# Patient Record
Sex: Female | Born: 1935 | Race: White | Hispanic: No | Smoking: Former smoker
Health system: Southern US, Community
[De-identification: ages and names within clinical notes are randomized; demographics above are authoritative.]

## PROBLEM LIST (undated history)

## (undated) DIAGNOSIS — Z9889 Other specified postprocedural states: Secondary | ICD-10-CM

## (undated) DIAGNOSIS — H409 Unspecified glaucoma: Secondary | ICD-10-CM

## (undated) DIAGNOSIS — E7800 Pure hypercholesterolemia, unspecified: Secondary | ICD-10-CM

## (undated) DIAGNOSIS — I10 Essential (primary) hypertension: Secondary | ICD-10-CM

## (undated) DIAGNOSIS — N189 Chronic kidney disease, unspecified: Secondary | ICD-10-CM

## (undated) DIAGNOSIS — M1990 Unspecified osteoarthritis, unspecified site: Secondary | ICD-10-CM

## (undated) DIAGNOSIS — R112 Nausea with vomiting, unspecified: Secondary | ICD-10-CM

## (undated) DIAGNOSIS — C801 Malignant (primary) neoplasm, unspecified: Secondary | ICD-10-CM

## (undated) DIAGNOSIS — T07XXXA Unspecified multiple injuries, initial encounter: Secondary | ICD-10-CM

## (undated) HISTORY — PX: JOINT REPLACEMENT: SHX530

## (undated) HISTORY — PX: OTHER SURGICAL HISTORY: SHX169

## (undated) HISTORY — PX: ABDOMINAL HYSTERECTOMY: SHX81

---

## 2006-04-01 ENCOUNTER — Inpatient Hospital Stay (HOSPITAL_COMMUNITY): Admission: RE | Admit: 2006-04-01 | Discharge: 2006-04-05 | Payer: Self-pay | Admitting: Orthopedic Surgery

## 2007-02-20 ENCOUNTER — Ambulatory Visit: Payer: Self-pay | Admitting: Gastroenterology

## 2007-03-04 ENCOUNTER — Encounter (INDEPENDENT_AMBULATORY_CARE_PROVIDER_SITE_OTHER): Payer: Self-pay | Admitting: Specialist

## 2007-03-04 ENCOUNTER — Ambulatory Visit: Payer: Self-pay | Admitting: Gastroenterology

## 2008-03-10 ENCOUNTER — Ambulatory Visit: Payer: Self-pay | Admitting: Internal Medicine

## 2008-07-16 ENCOUNTER — Ambulatory Visit: Payer: Self-pay | Admitting: Internal Medicine

## 2008-07-27 ENCOUNTER — Ambulatory Visit: Payer: Self-pay | Admitting: Internal Medicine

## 2008-08-10 ENCOUNTER — Encounter: Payer: Self-pay | Admitting: Internal Medicine

## 2008-11-19 DIAGNOSIS — N189 Chronic kidney disease, unspecified: Secondary | ICD-10-CM

## 2008-11-19 HISTORY — DX: Chronic kidney disease, unspecified: N18.9

## 2010-07-22 ENCOUNTER — Inpatient Hospital Stay (HOSPITAL_COMMUNITY): Admission: EM | Admit: 2010-07-22 | Discharge: 2010-07-27 | Payer: Self-pay

## 2010-07-22 ENCOUNTER — Encounter: Payer: Self-pay | Admitting: Emergency Medicine

## 2011-02-01 LAB — PROTIME-INR
INR: 1.07 (ref 0.00–1.49)
Prothrombin Time: 14.1 seconds (ref 11.6–15.2)

## 2011-02-01 LAB — BASIC METABOLIC PANEL WITH GFR
CO2: 28 meq/L (ref 19–32)
Calcium: 8.9 mg/dL (ref 8.4–10.5)
Chloride: 99 meq/L (ref 96–112)

## 2011-02-01 LAB — URINALYSIS, ROUTINE W REFLEX MICROSCOPIC
Bilirubin Urine: NEGATIVE
Glucose, UA: NEGATIVE mg/dL
Ketones, ur: NEGATIVE mg/dL
Leukocytes, UA: NEGATIVE
Nitrite: NEGATIVE
Protein, ur: NEGATIVE mg/dL
Specific Gravity, Urine: 1.01 (ref 1.005–1.030)
Urobilinogen, UA: 0.2 mg/dL (ref 0.0–1.0)
pH: 7 (ref 5.0–8.0)

## 2011-02-01 LAB — DIFFERENTIAL
Basophils Absolute: 0 K/uL (ref 0.0–0.1)
Basophils Relative: 0 % (ref 0–1)
Eosinophils Absolute: 0.1 K/uL (ref 0.0–0.7)
Eosinophils Relative: 1 % (ref 0–5)
Lymphocytes Relative: 16 % (ref 12–46)
Lymphs Abs: 1.4 K/uL (ref 0.7–4.0)
Monocytes Absolute: 0.4 10*3/uL (ref 0.1–1.0)
Monocytes Relative: 5 % (ref 3–12)
Neutro Abs: 6.9 10*3/uL (ref 1.7–7.7)
Neutrophils Relative %: 78 % — ABNORMAL HIGH (ref 43–77)

## 2011-02-01 LAB — APTT: aPTT: 28 seconds (ref 24–37)

## 2011-02-01 LAB — POCT CARDIAC MARKERS
CKMB, poc: 2.2 ng/mL (ref 1.0–8.0)
Myoglobin, poc: 112 ng/mL (ref 12–200)
Troponin i, poc: <0.05 ng/mL (ref 0.00–0.09)

## 2011-02-01 LAB — CBC
HCT: 38 % (ref 36.0–46.0)
Hemoglobin: 13.1 g/dL (ref 12.0–15.0)
Hemoglobin: 11.7 g/dL — ABNORMAL LOW (ref 12.0–15.0)
MCH: 30.5 pg (ref 26.0–34.0)
MCHC: 34.5 g/dL (ref 30.0–36.0)
MCV: 88.6 fL (ref 78.0–100.0)
MCV: 85.3 fL (ref 78.0–100.0)
Platelets: 215 K/uL (ref 150–400)
Platelets: 193 10*3/uL (ref 150–400)
RBC: 4.3 MIL/uL (ref 3.87–5.11)
RBC: 4.08 MIL/uL (ref 3.87–5.11)
RDW: 13.6 % (ref 11.5–15.5)
RDW: 13.3 % (ref 11.5–15.5)
WBC: 8.8 10*3/uL (ref 4.0–10.5)

## 2011-02-01 LAB — TYPE AND SCREEN
ABO/RH(D): A POS
Antibody Screen: NEGATIVE

## 2011-02-01 LAB — BASIC METABOLIC PANEL
BUN: 15 mg/dL (ref 6–23)
Calcium: 8.6 mg/dL (ref 8.4–10.5)
Chloride: 100 mEq/L (ref 96–112)
Creatinine, Ser: 0.7 mg/dL (ref 0.4–1.2)
Creatinine, Ser: 0.73 mg/dL (ref 0.4–1.2)
GFR calc Af Amer: >60 mL/min (ref >60)
GFR calc non Af Amer: >60 mL/min (ref >60)
Glucose, Bld: 106 mg/dL — ABNORMAL HIGH (ref 70–99)
Glucose, Bld: 95 mg/dL (ref 70–99)
Potassium: 3.8 mEq/L (ref 3.5–5.1)
Sodium: 133 mEq/L — ABNORMAL LOW (ref 135–145)

## 2011-02-01 LAB — D-DIMER, QUANTITATIVE: D-Dimer, Quant: 3.87 ug{FEU}/mL — ABNORMAL HIGH (ref 0.00–0.48)

## 2011-02-01 LAB — URINE MICROSCOPIC-ADD ON

## 2011-04-03 NOTE — Assessment & Plan Note (Signed)
[facility]                         GASTROENTEROLOGY OFFICE NOTE   PERSEPHANIE, LAATSCH              MRN:          [id number]  DATE:03/10/2008                            DOB:          05-Oct-1926    CHIEF COMPLAINT:  Follow up after colonoscopy April, 2008.   Ms. Fenter had a colonoscopic examination by Dr. Corinda Gubler March 04, 2007.  There was a polypoid lesion in the rectum, biopsy showed  hyperplastic results.  Dr. Corinda Gubler thought she should have a follow up  of this area.  She has a history of rectal prolapse which persists, in  fact, she was evaluated by Dr. Kendrick Ranch for this in about 2005.  She  deals with it and has to reinsert the rectum when it prolapses some, it  sounds like this is mild and unchanged.   MEDICATIONS:  1. Lipitor 10 mg at bedtime.  2. Aspirin 81 mg daily.  3. Travatan eye drops.  4. Multivitamin daily.  5. Tums 400 mg daily.   There were no known drug allergies.   PAST MEDICAL HISTORY:  1. Glaucoma.  2. Rectal prolapse.  3. Dyslipidemia.   PAST SURGICAL HISTORY:  1. Partial hysterectomy, 1969.  2. Left hip replacement, 2007.   FAMILY HISTORY:  1. Heart disease in a brother.  2. Colon cancer in her brother.   SOCIAL HISTORY:  1. Married.  2. She has been an Biomedical engineer.  3. One glass of alcohol daily.  4. No tobacco or drugs.   REVIEW OF SYSTEMS:  Eyeglasses or contacts are used, joint pain.   PHYSICAL:  Height 5 feet 6 inches, weight 148 pounds, blood pressure  170/88 on the right arm sitting, pulse 60, regular.   ASSESSMENT:  Hyperplastic polypoid changes in the rectum.  I suspect  this is due to rectal prolapse.  The previous evaluator thought that  this ought to be examined again, so we will perform a flexible  sigmoidoscopy in early May just to recheck this area, but I suspect this  is related to rectal prolapse.  Risks, benefits and indications are  explained, she understands and  agrees to proceed.     Iva Boop, MD,FACG  Electronically Signed    CEG/MedQ  DD: 03/14/2008  DT: 03/14/2008  Job #: [id number]

## 2011-04-06 NOTE — H&P (Signed)
NAME:  Christy Houston, MEARNS NO.:  [id number]   MEDICAL RECORD NO.:  [id number]          PATIENT TYPE:  INP   LOCATION:  NA                           FACILITY:  [facility]   PHYSICIAN:  Ollen Gross, M.D.    DATE OF BIRTH:  December 27, 1925   DATE OF ADMISSION:  04/01/2006  DATE OF DISCHARGE:                                HISTORY & PHYSICAL   CHIEF COMPLAINT:  Left hip pain.   HISTORY OF PRESENT ILLNESS:  The patient is a 77 year old female seen by Dr.  Ollen Gross for aggressive history of worsening left hip pain.  She has  had problems with hip for quite some time but it has been progressively  getting worse over the past six months with decreased function.  She has  been seen in the office.  Her x-rays showed end-stage arthritis of the left  hip with bone-on-bone and large osteophyte formation.  Significant findings  felt to be a good candidate for surgical intervention.  Risks and benefits  discussed.  The patient was subsequently admitted to the hospital.  She has  been seen preoperatively by Dr. Tomasa Blase and felt to represent a low medical  risk for planned hip surgery.   ALLERGIES:  No known drug allergies.   CURRENT MEDICATIONS:  1.  Lodine 400 mg twice a day.  2.  Lipitor 10 mg at bed time.  3.  Travatan 1 drop each eye nightly.  4.  Aspirin 81 mg daily.  5.  Fosamax 70 mg weekly.  6.  Multivitamin daily.  7.  Tums supplement daily.   PAST MEDICAL HISTORY:  1.  Hypertension.  2.  Hypercholesterolemia.  3.  History of colon polyps.  4.  History of rectal prolapse.  5.  History of basal cell skin cancer.  6.  Postmenopausal.  7.  History of stress fracture right tibia that is treated conservatively.   PAST SURGICAL HISTORY:  1.  Colonoscopy with polypectomy.  2.  Hysterectomy.  3.  History of PACs and PVCs.   SOCIAL HISTORY:  Married, retired, nonsmoker, one glass of wine at dinner,  three stepchildren.  Husband will be assisting with care after  surgery.   FAMILY HISTORY:  Brother and mother with history of hypertension.  Brothers  with colon cancer and prostate cancer.   REVIEW OF SYSTEMS:  GENERAL:  No fever, chills, night sweats.  NEUROLOGICAL:  No seizures, syncope or paralysis.  RESPIRATORY:  No shortness of breath,  productive cough or hemoptysis.  CARDIOVASCULAR:  No chest pain, angina or  orthopnea.  GI:  No nausea, vomiting, diarrhea, constipation.  GU:  No  dysuria, hematuria or discharge.  MUSCULOSKELETAL:  Left hip.   PHYSICAL EXAMINATION:  VITAL SIGNS:  Pulse 76, respiration 12, blood  pressure 162/82.  GENERAL:  A 77 year old white female well-developed, well-nourished in no  acute distress, glasses.  Alert, oriented and cooperative.  Accompanied by  her husband.  HEENT:  Normocephalic and atraumatic.  Pupils round and reactive.  Oropharynx clear.  Partial lower denture plate.  NECK:  Supple.  CHEST:  Clear.  Anterior and posterior chest walls.  HEART:  Regular  rhythm with an irregular ectopic (PVCs).  A 2/6 systolic  ejection murmur best heard at pulmonic and Erb's point.  ABDOMEN:  Soft and nontender.  Bowel sounds present.  RECTAL:  Not done, not pertinent to present illness.  BREASTS:  Not done, not pertinent to present illness.  GENITALIA:  Not done, not pertinent to present illness.  EXTREMITIES:  Left hip shows flexion of 90 degrees, 0 internal rotation, 10  degrees of external rotation, 20 degrees of abduction.   IMPRESSION:  1.  Osteoarthritis left hip.  2.  History of premature atrial contractures and premature ventricular      contractions.  3.  Hypercholesterolemia.  4.  Hypertension.  5.  History of colon polyps, status post polypectomy.  6.  History of minimal rectal prolapse.  7.  History of basal cell skin cancer.  8.  Postmenopausal.   PLAN:  The patient is admitted to [facility] to undergo a left  total hip arthroplasty.  Surgery will be performed by Dr. Ollen Gross.      Alexzandrew L. Julien Girt, P.A.      Ollen Gross, M.D.  Electronically Signed    ALP/MEDQ  D:  03/31/2006  T:  03/31/2006  Job:  [id number]   cc:   Foye Deer, M.D.  [facility]   Ollen Gross, M.D.  Fax: [phone]

## 2011-04-06 NOTE — Discharge Summary (Signed)
NAME:  ALENNA, RUSSELL       ACCOUNT NO.:  [id number]   MEDICAL RECORD NO.:  [id number]          PATIENT TYPE:  INP   LOCATION:  1604                         FACILITY:  [facility]   PHYSICIAN:  Ollen Gross, M.D.    DATE OF BIRTH:  [birth year]   DATE OF ADMISSION:  04/01/2006  DATE OF DISCHARGE:  04/05/2006                                 DISCHARGE SUMMARY   ADMISSION DIAGNOSES:  1.  Osteoarthritis of the left hip.  2.  History of premature atrial contractures and premature ventricular      contractures.  3.  Hypercholesterolemia.  4.  Hypertension.  5.  History of colon polyps status post polypectomy.  6.  History of minimal rectal prolapse.  7.  History of basal cell skin cancer.  8.  Postmenopausal.   DISCHARGE DIAGNOSES:  1.  Osteoarthritis of the left hip status post left total knee arthroplasty.  2.  Postoperative blood loss anemia, did not require transfusion.  3.  Postoperative hyponatremia.  4.  History of premature atrial contractures and premature ventricular      contractures.  5.  Hypercholesterolemia.  6.  Hypertension.  7.  History of colon polyps status post polypectomy.  8.  History of minimal rectal prolapse.  9.  History of basal cell skin cancer.  10. Postmenopausal.   PROCEDURES:  On Apr 01, 2006, left total hip surgery.   SURGEON:  Ollen Gross, M.D.   ASSISTANT:  Alexzandrew L. Julien Girt, P.A.   ANESTHESIA:  General.   CONSULTATIONS:  None.   BRIEF HISTORY:  Mrs. Perot is a 77 year old female with severe end  stage osteoarthritis of the left hip and intractable pain, __________and now  presents for total arthroplasty.   LABORATORY DATA:  CBC on admission:  Hemoglobin 13.6, hematocrit 39.3, white  count 6.3, postop hemoglobin down to 11.1, drifted down to 9.2 where it  stabilized.  Last H&H was 9.2 and 26.7.  PT/PTT preop were 13.6 and 34,  respectively with INR 1.0.  Serial pro times followed.  PT/INR 18.0 and 1.5.  Chem panel on  admission all within normal limits.  Serum B-mets are as  follows:  Sodium dropped from 136 to 132, drifting down to 130, back up to  131.  The remaining electrolytes were negative within normal limits.  Calcium dropped from 9.8 to 7.7 back up to 8.1.  Preop UA cloudy with small  leukocyte esterase.  Only 0-2 white cells, many bacteria.  Otherwise,  negative.  Blood group check A positive.   EKG dated Mar 26, 2006, normal sinus rhythm, nonspecific ST abnormalities,  confirmed by Dr. Nanetta Batty.  A two view of the chest on Mar 26, 2006,  revealed no evidence of active chest disease.  Left hip films Mar 26, 2006,  severe changes of osteoarthritis involving the left hip.  Portable pelvic  and hip film on Apr 01, 2006, expected appearance after left total  arthroplasty.   HOSPITAL COURSE:  Admitted to [facility], tolerated procedure well  and later transferred to the recovery room and then orthopedic floor.  Started on PCA and p.o. analgesics for pain control following surgery.  Seen  on morning rounds.  Had a slightly low potassium.  It was low normal, but  still low from her admission.  She was given a little bit of potassium  supplements.  Started getting up out of bed.  The Hemovac drain placed at  the time of surgery was pulled without difficulty.  By day #2, she had a  little bit of intermittent nausea the night before, but that was improving.  Monitored for symptoms.  Mild hypokalemia improved but her sodium dropped.  Discontinued the fluids.  Hemoglobin had drifted down to 9.2.  She was  placed on iron and rechecked.  Started getting up with physical therapy a  little bit more, and on day #2, she was ambulating approximately 28-30 feet.  Dressing was changed.  Incision looked very good.  By day #3, she had a  little bit of soreness but had not taken any pain medications.  She was  tolerating well.  Hyponatremia had drifted down further but stabilized at  130.  She was  placed on mild fluid restrictions because of the hyponatremia.  Incision remained in good condition.  Hemoglobin had stabilized at 9.2.  She  was placed on iron.  She started ambulating very well, greater than 90 feet  by the following day.  Her sodium actually improved a little bit, went up,  and incision continued to look good.  She was ambulating greater than 130  feet.  No complaints, doing well, and was discharged home.   PLAN:  1.  Being discharged home on Apr 05, 2006.  2.  For discharge diagnoses, please see above.   DISCHARGE MEDICATIONS:  1.  Coumadin.  2.  Iron supplement.  3.  Percocet.  4.  Robaxin.   DISCHARGE INSTRUCTIONS:  1.  Diet:  Low cholesterol diet.  2.  Activity:  Partial weightbearing 25-50% left lower extremity.  Hip      precautions:  Total hip protocol.   FOLLOWUP:  Follow up in two weeks from surgery.   DISPOSITION:  To home.   CONDITION ON DISCHARGE:  Improved.      Alexzandrew L. Julien Girt, P.A.      Ollen Gross, M.D.  Electronically Signed    ALP/MEDQ  D:  04/17/2006  T:  04/17/2006  Job:  [id number]   cc:   Duane Boston, M.D.  [facility]

## 2011-04-06 NOTE — Op Note (Signed)
NAME:  Christy Houston, Christy Houston NO.:  [id number]   MEDICAL RECORD NO.:  [id number]          PATIENT TYPE:  INP   LOCATION:  0004                         FACILITY:  [facility]   PHYSICIAN:  Ollen Gross, M.D.    DATE OF BIRTH:  10-May-1926   DATE OF PROCEDURE:  04/01/2006  DATE OF DISCHARGE:                                 OPERATIVE REPORT   PREOPERATIVE DIAGNOSIS:  Osteoarthritis of the left hip.   POSTOPERATIVE DIAGNOSIS:  Osteoarthritis of the left hip.   PROCEDURE:  Left total hip arthroplasty.   SURGEON:  Ollen Gross, M.D.   ASSISTANT:  Alexzandrew L. Julien Girt, P.A.   ANESTHESIA:  General.   ESTIMATED BLOOD LOSS:  300 cc.   DRAIN:  Hemovac x1.   COMPLICATIONS:  None.   CONDITION:  Stable to recovery.   BRIEF CLINICAL NOTE:  Christy Houston is a 77 year old female with severe  end-stage osteoarthritis of the left hip with intractable pain.  She has  failed nonoperative management and presents now for total hip arthroplasty.   PROCEDURE IN DETAIL:  After the successful initiation of general anesthetic,  the patient was placed in the right lateral decubitus position with the left  side up and held with the hip positioner.  The left lower extremity was  isolated from her perineum with plastic drapes, and prepped and draped in  the usual sterile fashion.  Short posterolateral incision was made with the  #10 blade through the subcutaneous tissue, to the level of fascia lata --  which was incised in-line with the skin incision.  The sciatic nerve was  palpated and protected,  and then the short rotator was isolated off the  femur.  Capsulectomy was performed and the hip was dislocated.  The center  of the femoral head was marked and a trial prosthesis placed; set that the  center of the trial head corresponds to the center of the native femoral  head.  Osteotomy lines marked on the femoral neck and osteotomy made with an  oscillating saw.  The femoral head was then  removed and the femur retracted  anteriorly to gain acetabular exposure.   Acetabular retractors were placed and the labrum and osteophytes were  removed.  She had a large anterior osteophyte.  Reaming starts at 45 mm,  coursing increments of 2 up to 53 mm; then a 54 mm Pinnacle acetabular shell  was placed in anatomic position, and transfixed with two dome screws.  A  trial 32 mm neutral liner was placed.   The femur is prepaed  with the canal finder and irrigation.  Axial reaming  was performed up to 15.5 mm, proximal reamed to a 20-F sleeve machined to a  small.  A 20-F small trial sleeve was placed with a 20 x 15 stem and 36-Plus  8 neck about 10 degrees beyond her native anteversion.  A trial 32.0 head  was placed.  I did not feel as though she had enough offset, so went to the  32-Plus 4 neutral liner, which corrected any offset tissues.  She had great  stability, full extension, full external rotation; 70 degrees flexion, 40  degrees adduction, 90 degrees internal rotation and 90 degrees flexion to  about 70 degrees internal rotation.  By placing the left leg on top of the  right, I felt as though we were a little short.  She also had some soft  tissue laxity with the shuck testing.  I went to a 32-Plus 3  head trial,  and it eliminated all of those issues.  The hip was then dislocated and all  trials were removed.  The permanent Apex hole eliminator was placed into the  acetabular shell, and the permanent 32 mm neutral Plus-4 marathon liner was  placed.  On the femoral side I placed the 20-F small sleeve with the 20 x 15  stem and 36-Plus 8 neck; again, 10 degrees beyond her native anteversion.  A  32-Plus 3 head was placed in the hip was reduced at the same stability  parameters.  The wound was copiously irrigated with saline solution and the  short rotators were reattached to the femur through drill holes.  The fascia  lata was closed over a Hemovac drain with interrupted #1  Vicryl.  The  subcutaneous was closed with #1-0 and #2-0 Vicryl, and subcuticular running  4-0 Monocryl.  The incision was cleaned and dried, then Steri-Strips and a  bulky sterile dressing applied.  She was then placed into a knee  immobilizer, awakened and transported to recovery in stable condition.      Ollen Gross, M.D.  Electronically Signed     FA/MEDQ  D:  04/01/2006  T:  04/01/2006  Job:  [id number]

## 2011-11-30 DIAGNOSIS — M79609 Pain in unspecified limb: Secondary | ICD-10-CM | POA: Diagnosis not present

## 2011-11-30 DIAGNOSIS — B351 Tinea unguium: Secondary | ICD-10-CM | POA: Diagnosis not present

## 2012-01-02 DIAGNOSIS — C649 Malignant neoplasm of unspecified kidney, except renal pelvis: Secondary | ICD-10-CM | POA: Diagnosis not present

## 2012-01-02 DIAGNOSIS — E785 Hyperlipidemia, unspecified: Secondary | ICD-10-CM | POA: Diagnosis not present

## 2012-01-02 DIAGNOSIS — E559 Vitamin D deficiency, unspecified: Secondary | ICD-10-CM | POA: Diagnosis not present

## 2012-01-02 DIAGNOSIS — Z0000 Encounter for general adult medical examination without abnormal findings: Secondary | ICD-10-CM | POA: Diagnosis not present

## 2012-01-02 DIAGNOSIS — Z79899 Other long term (current) drug therapy: Secondary | ICD-10-CM | POA: Diagnosis not present

## 2012-01-02 DIAGNOSIS — Z1231 Encounter for screening mammogram for malignant neoplasm of breast: Secondary | ICD-10-CM | POA: Diagnosis not present

## 2012-01-03 DIAGNOSIS — M1710 Unilateral primary osteoarthritis, unspecified knee: Secondary | ICD-10-CM | POA: Diagnosis not present

## 2012-02-01 DIAGNOSIS — B351 Tinea unguium: Secondary | ICD-10-CM | POA: Diagnosis not present

## 2012-02-01 DIAGNOSIS — M79609 Pain in unspecified limb: Secondary | ICD-10-CM | POA: Diagnosis not present

## 2012-02-14 DIAGNOSIS — H4011X0 Primary open-angle glaucoma, stage unspecified: Secondary | ICD-10-CM | POA: Diagnosis not present

## 2012-02-14 DIAGNOSIS — H409 Unspecified glaucoma: Secondary | ICD-10-CM | POA: Diagnosis not present

## 2012-04-11 DIAGNOSIS — M79609 Pain in unspecified limb: Secondary | ICD-10-CM | POA: Diagnosis not present

## 2012-04-11 DIAGNOSIS — B351 Tinea unguium: Secondary | ICD-10-CM | POA: Diagnosis not present

## 2012-06-06 DIAGNOSIS — H4011X0 Primary open-angle glaucoma, stage unspecified: Secondary | ICD-10-CM | POA: Diagnosis not present

## 2012-06-06 DIAGNOSIS — H409 Unspecified glaucoma: Secondary | ICD-10-CM | POA: Diagnosis not present

## 2012-06-06 DIAGNOSIS — Z83511 Family history of glaucoma: Secondary | ICD-10-CM | POA: Diagnosis not present

## 2012-06-13 DIAGNOSIS — M79609 Pain in unspecified limb: Secondary | ICD-10-CM | POA: Diagnosis not present

## 2012-06-13 DIAGNOSIS — B351 Tinea unguium: Secondary | ICD-10-CM | POA: Diagnosis not present

## 2012-06-18 DIAGNOSIS — M1710 Unilateral primary osteoarthritis, unspecified knee: Secondary | ICD-10-CM | POA: Diagnosis not present

## 2012-06-25 DIAGNOSIS — D4100 Neoplasm of uncertain behavior of unspecified kidney: Secondary | ICD-10-CM | POA: Diagnosis not present

## 2012-06-25 DIAGNOSIS — N390 Urinary tract infection, site not specified: Secondary | ICD-10-CM | POA: Diagnosis not present

## 2012-06-25 DIAGNOSIS — R300 Dysuria: Secondary | ICD-10-CM | POA: Diagnosis not present

## 2012-06-25 DIAGNOSIS — R350 Frequency of micturition: Secondary | ICD-10-CM | POA: Diagnosis not present

## 2012-07-02 DIAGNOSIS — I10 Essential (primary) hypertension: Secondary | ICD-10-CM | POA: Diagnosis not present

## 2012-07-02 DIAGNOSIS — K589 Irritable bowel syndrome without diarrhea: Secondary | ICD-10-CM | POA: Diagnosis not present

## 2012-07-02 DIAGNOSIS — E785 Hyperlipidemia, unspecified: Secondary | ICD-10-CM | POA: Diagnosis not present

## 2012-07-02 DIAGNOSIS — IMO0002 Reserved for concepts with insufficient information to code with codable children: Secondary | ICD-10-CM | POA: Diagnosis not present

## 2012-07-02 DIAGNOSIS — C649 Malignant neoplasm of unspecified kidney, except renal pelvis: Secondary | ICD-10-CM | POA: Diagnosis not present

## 2012-07-29 DIAGNOSIS — M169 Osteoarthritis of hip, unspecified: Secondary | ICD-10-CM | POA: Diagnosis not present

## 2012-08-15 DIAGNOSIS — M79609 Pain in unspecified limb: Secondary | ICD-10-CM | POA: Diagnosis not present

## 2012-08-15 DIAGNOSIS — B351 Tinea unguium: Secondary | ICD-10-CM | POA: Diagnosis not present

## 2012-09-23 DIAGNOSIS — H409 Unspecified glaucoma: Secondary | ICD-10-CM | POA: Diagnosis not present

## 2012-09-23 DIAGNOSIS — H4011X0 Primary open-angle glaucoma, stage unspecified: Secondary | ICD-10-CM | POA: Diagnosis not present

## 2012-10-08 DIAGNOSIS — M1710 Unilateral primary osteoarthritis, unspecified knee: Secondary | ICD-10-CM | POA: Diagnosis not present

## 2012-10-17 DIAGNOSIS — B351 Tinea unguium: Secondary | ICD-10-CM | POA: Diagnosis not present

## 2012-10-17 DIAGNOSIS — M79609 Pain in unspecified limb: Secondary | ICD-10-CM | POA: Diagnosis not present

## 2012-10-24 DIAGNOSIS — M2010 Hallux valgus (acquired), unspecified foot: Secondary | ICD-10-CM | POA: Diagnosis not present

## 2012-10-24 DIAGNOSIS — Z6825 Body mass index (BMI) 25.0-25.9, adult: Secondary | ICD-10-CM | POA: Diagnosis not present

## 2012-10-24 DIAGNOSIS — M21619 Bunion of unspecified foot: Secondary | ICD-10-CM | POA: Diagnosis not present

## 2012-11-17 DIAGNOSIS — M2010 Hallux valgus (acquired), unspecified foot: Secondary | ICD-10-CM | POA: Diagnosis not present

## 2012-11-17 DIAGNOSIS — M76829 Posterior tibial tendinitis, unspecified leg: Secondary | ICD-10-CM | POA: Diagnosis not present

## 2012-11-17 DIAGNOSIS — M2040 Other hammer toe(s) (acquired), unspecified foot: Secondary | ICD-10-CM | POA: Diagnosis not present

## 2012-11-27 DIAGNOSIS — M76829 Posterior tibial tendinitis, unspecified leg: Secondary | ICD-10-CM | POA: Diagnosis not present

## 2012-12-19 DIAGNOSIS — B351 Tinea unguium: Secondary | ICD-10-CM | POA: Diagnosis not present

## 2012-12-19 DIAGNOSIS — M79609 Pain in unspecified limb: Secondary | ICD-10-CM | POA: Diagnosis not present

## 2013-01-07 DIAGNOSIS — H4011X0 Primary open-angle glaucoma, stage unspecified: Secondary | ICD-10-CM | POA: Diagnosis not present

## 2013-01-07 DIAGNOSIS — H409 Unspecified glaucoma: Secondary | ICD-10-CM | POA: Diagnosis not present

## 2013-01-15 DIAGNOSIS — Z0000 Encounter for general adult medical examination without abnormal findings: Secondary | ICD-10-CM | POA: Diagnosis not present

## 2013-01-15 DIAGNOSIS — C649 Malignant neoplasm of unspecified kidney, except renal pelvis: Secondary | ICD-10-CM | POA: Diagnosis not present

## 2013-01-15 DIAGNOSIS — R0989 Other specified symptoms and signs involving the circulatory and respiratory systems: Secondary | ICD-10-CM | POA: Diagnosis not present

## 2013-01-15 DIAGNOSIS — Z1231 Encounter for screening mammogram for malignant neoplasm of breast: Secondary | ICD-10-CM | POA: Diagnosis not present

## 2013-01-15 DIAGNOSIS — E785 Hyperlipidemia, unspecified: Secondary | ICD-10-CM | POA: Diagnosis not present

## 2013-01-15 DIAGNOSIS — R0609 Other forms of dyspnea: Secondary | ICD-10-CM | POA: Diagnosis not present

## 2013-01-15 DIAGNOSIS — E559 Vitamin D deficiency, unspecified: Secondary | ICD-10-CM | POA: Diagnosis not present

## 2013-01-15 DIAGNOSIS — I10 Essential (primary) hypertension: Secondary | ICD-10-CM | POA: Diagnosis not present

## 2013-01-15 DIAGNOSIS — IMO0002 Reserved for concepts with insufficient information to code with codable children: Secondary | ICD-10-CM | POA: Diagnosis not present

## 2013-02-02 DIAGNOSIS — S6990XA Unspecified injury of unspecified wrist, hand and finger(s), initial encounter: Secondary | ICD-10-CM | POA: Diagnosis not present

## 2013-02-02 DIAGNOSIS — S59909A Unspecified injury of unspecified elbow, initial encounter: Secondary | ICD-10-CM | POA: Diagnosis not present

## 2013-02-02 DIAGNOSIS — S5000XA Contusion of unspecified elbow, initial encounter: Secondary | ICD-10-CM | POA: Diagnosis not present

## 2013-02-05 DIAGNOSIS — S42413A Displaced simple supracondylar fracture without intercondylar fracture of unspecified humerus, initial encounter for closed fracture: Secondary | ICD-10-CM | POA: Diagnosis not present

## 2013-02-12 DIAGNOSIS — Z4789 Encounter for other orthopedic aftercare: Secondary | ICD-10-CM | POA: Diagnosis not present

## 2013-02-23 DIAGNOSIS — Z4789 Encounter for other orthopedic aftercare: Secondary | ICD-10-CM | POA: Diagnosis not present

## 2013-03-03 DIAGNOSIS — S42413A Displaced simple supracondylar fracture without intercondylar fracture of unspecified humerus, initial encounter for closed fracture: Secondary | ICD-10-CM | POA: Diagnosis not present

## 2013-03-16 DIAGNOSIS — IMO0001 Reserved for inherently not codable concepts without codable children: Secondary | ICD-10-CM | POA: Diagnosis not present

## 2013-03-20 DIAGNOSIS — B351 Tinea unguium: Secondary | ICD-10-CM | POA: Diagnosis not present

## 2013-03-20 DIAGNOSIS — M79609 Pain in unspecified limb: Secondary | ICD-10-CM | POA: Diagnosis not present

## 2013-03-24 DIAGNOSIS — S42413A Displaced simple supracondylar fracture without intercondylar fracture of unspecified humerus, initial encounter for closed fracture: Secondary | ICD-10-CM | POA: Diagnosis not present

## 2013-03-24 DIAGNOSIS — M6281 Muscle weakness (generalized): Secondary | ICD-10-CM | POA: Diagnosis not present

## 2013-03-25 DIAGNOSIS — M6281 Muscle weakness (generalized): Secondary | ICD-10-CM | POA: Diagnosis not present

## 2013-03-25 DIAGNOSIS — S42413A Displaced simple supracondylar fracture without intercondylar fracture of unspecified humerus, initial encounter for closed fracture: Secondary | ICD-10-CM | POA: Diagnosis not present

## 2013-03-26 DIAGNOSIS — S42413A Displaced simple supracondylar fracture without intercondylar fracture of unspecified humerus, initial encounter for closed fracture: Secondary | ICD-10-CM | POA: Diagnosis not present

## 2013-03-26 DIAGNOSIS — M6281 Muscle weakness (generalized): Secondary | ICD-10-CM | POA: Diagnosis not present

## 2013-03-30 DIAGNOSIS — M6281 Muscle weakness (generalized): Secondary | ICD-10-CM | POA: Diagnosis not present

## 2013-03-30 DIAGNOSIS — S42413A Displaced simple supracondylar fracture without intercondylar fracture of unspecified humerus, initial encounter for closed fracture: Secondary | ICD-10-CM | POA: Diagnosis not present

## 2013-03-31 DIAGNOSIS — S42413A Displaced simple supracondylar fracture without intercondylar fracture of unspecified humerus, initial encounter for closed fracture: Secondary | ICD-10-CM | POA: Diagnosis not present

## 2013-03-31 DIAGNOSIS — M6281 Muscle weakness (generalized): Secondary | ICD-10-CM | POA: Diagnosis not present

## 2013-04-03 DIAGNOSIS — IMO0001 Reserved for inherently not codable concepts without codable children: Secondary | ICD-10-CM | POA: Diagnosis not present

## 2013-04-06 DIAGNOSIS — M6281 Muscle weakness (generalized): Secondary | ICD-10-CM | POA: Diagnosis not present

## 2013-04-06 DIAGNOSIS — S42413A Displaced simple supracondylar fracture without intercondylar fracture of unspecified humerus, initial encounter for closed fracture: Secondary | ICD-10-CM | POA: Diagnosis not present

## 2013-04-07 DIAGNOSIS — M6281 Muscle weakness (generalized): Secondary | ICD-10-CM | POA: Diagnosis not present

## 2013-04-07 DIAGNOSIS — S42413A Displaced simple supracondylar fracture without intercondylar fracture of unspecified humerus, initial encounter for closed fracture: Secondary | ICD-10-CM | POA: Diagnosis not present

## 2013-04-08 DIAGNOSIS — H4011X0 Primary open-angle glaucoma, stage unspecified: Secondary | ICD-10-CM | POA: Diagnosis not present

## 2013-04-08 DIAGNOSIS — H409 Unspecified glaucoma: Secondary | ICD-10-CM | POA: Diagnosis not present

## 2013-04-09 DIAGNOSIS — M6281 Muscle weakness (generalized): Secondary | ICD-10-CM | POA: Diagnosis not present

## 2013-04-09 DIAGNOSIS — S42413A Displaced simple supracondylar fracture without intercondylar fracture of unspecified humerus, initial encounter for closed fracture: Secondary | ICD-10-CM | POA: Diagnosis not present

## 2013-04-13 DIAGNOSIS — M6281 Muscle weakness (generalized): Secondary | ICD-10-CM | POA: Diagnosis not present

## 2013-04-13 DIAGNOSIS — S42413A Displaced simple supracondylar fracture without intercondylar fracture of unspecified humerus, initial encounter for closed fracture: Secondary | ICD-10-CM | POA: Diagnosis not present

## 2013-04-14 DIAGNOSIS — M6281 Muscle weakness (generalized): Secondary | ICD-10-CM | POA: Diagnosis not present

## 2013-04-14 DIAGNOSIS — S42413A Displaced simple supracondylar fracture without intercondylar fracture of unspecified humerus, initial encounter for closed fracture: Secondary | ICD-10-CM | POA: Diagnosis not present

## 2013-04-16 DIAGNOSIS — S42413A Displaced simple supracondylar fracture without intercondylar fracture of unspecified humerus, initial encounter for closed fracture: Secondary | ICD-10-CM | POA: Diagnosis not present

## 2013-04-16 DIAGNOSIS — M6281 Muscle weakness (generalized): Secondary | ICD-10-CM | POA: Diagnosis not present

## 2013-04-20 DIAGNOSIS — S42413A Displaced simple supracondylar fracture without intercondylar fracture of unspecified humerus, initial encounter for closed fracture: Secondary | ICD-10-CM | POA: Diagnosis not present

## 2013-04-20 DIAGNOSIS — M6281 Muscle weakness (generalized): Secondary | ICD-10-CM | POA: Diagnosis not present

## 2013-04-21 DIAGNOSIS — S42413A Displaced simple supracondylar fracture without intercondylar fracture of unspecified humerus, initial encounter for closed fracture: Secondary | ICD-10-CM | POA: Diagnosis not present

## 2013-04-21 DIAGNOSIS — M6281 Muscle weakness (generalized): Secondary | ICD-10-CM | POA: Diagnosis not present

## 2013-04-23 DIAGNOSIS — M6281 Muscle weakness (generalized): Secondary | ICD-10-CM | POA: Diagnosis not present

## 2013-04-23 DIAGNOSIS — S42413A Displaced simple supracondylar fracture without intercondylar fracture of unspecified humerus, initial encounter for closed fracture: Secondary | ICD-10-CM | POA: Diagnosis not present

## 2013-04-27 DIAGNOSIS — S42413A Displaced simple supracondylar fracture without intercondylar fracture of unspecified humerus, initial encounter for closed fracture: Secondary | ICD-10-CM | POA: Diagnosis not present

## 2013-04-27 DIAGNOSIS — M6281 Muscle weakness (generalized): Secondary | ICD-10-CM | POA: Diagnosis not present

## 2013-04-28 DIAGNOSIS — M6281 Muscle weakness (generalized): Secondary | ICD-10-CM | POA: Diagnosis not present

## 2013-04-28 DIAGNOSIS — S42413A Displaced simple supracondylar fracture without intercondylar fracture of unspecified humerus, initial encounter for closed fracture: Secondary | ICD-10-CM | POA: Diagnosis not present

## 2013-04-30 DIAGNOSIS — M6281 Muscle weakness (generalized): Secondary | ICD-10-CM | POA: Diagnosis not present

## 2013-04-30 DIAGNOSIS — S42413A Displaced simple supracondylar fracture without intercondylar fracture of unspecified humerus, initial encounter for closed fracture: Secondary | ICD-10-CM | POA: Diagnosis not present

## 2013-05-01 DIAGNOSIS — IMO0001 Reserved for inherently not codable concepts without codable children: Secondary | ICD-10-CM | POA: Diagnosis not present

## 2013-05-05 DIAGNOSIS — S42413A Displaced simple supracondylar fracture without intercondylar fracture of unspecified humerus, initial encounter for closed fracture: Secondary | ICD-10-CM | POA: Diagnosis not present

## 2013-05-05 DIAGNOSIS — M6281 Muscle weakness (generalized): Secondary | ICD-10-CM | POA: Diagnosis not present

## 2013-05-07 DIAGNOSIS — M6281 Muscle weakness (generalized): Secondary | ICD-10-CM | POA: Diagnosis not present

## 2013-05-07 DIAGNOSIS — S42413A Displaced simple supracondylar fracture without intercondylar fracture of unspecified humerus, initial encounter for closed fracture: Secondary | ICD-10-CM | POA: Diagnosis not present

## 2013-05-08 DIAGNOSIS — M1710 Unilateral primary osteoarthritis, unspecified knee: Secondary | ICD-10-CM | POA: Diagnosis not present

## 2013-05-11 DIAGNOSIS — M6281 Muscle weakness (generalized): Secondary | ICD-10-CM | POA: Diagnosis not present

## 2013-05-11 DIAGNOSIS — S42413A Displaced simple supracondylar fracture without intercondylar fracture of unspecified humerus, initial encounter for closed fracture: Secondary | ICD-10-CM | POA: Diagnosis not present

## 2013-05-12 DIAGNOSIS — S42413A Displaced simple supracondylar fracture without intercondylar fracture of unspecified humerus, initial encounter for closed fracture: Secondary | ICD-10-CM | POA: Diagnosis not present

## 2013-05-12 DIAGNOSIS — M6281 Muscle weakness (generalized): Secondary | ICD-10-CM | POA: Diagnosis not present

## 2013-05-14 DIAGNOSIS — S42413A Displaced simple supracondylar fracture without intercondylar fracture of unspecified humerus, initial encounter for closed fracture: Secondary | ICD-10-CM | POA: Diagnosis not present

## 2013-05-14 DIAGNOSIS — M6281 Muscle weakness (generalized): Secondary | ICD-10-CM | POA: Diagnosis not present

## 2013-05-18 DIAGNOSIS — M6281 Muscle weakness (generalized): Secondary | ICD-10-CM | POA: Diagnosis not present

## 2013-05-18 DIAGNOSIS — S42413A Displaced simple supracondylar fracture without intercondylar fracture of unspecified humerus, initial encounter for closed fracture: Secondary | ICD-10-CM | POA: Diagnosis not present

## 2013-05-21 DIAGNOSIS — M6281 Muscle weakness (generalized): Secondary | ICD-10-CM | POA: Diagnosis not present

## 2013-05-21 DIAGNOSIS — S42413A Displaced simple supracondylar fracture without intercondylar fracture of unspecified humerus, initial encounter for closed fracture: Secondary | ICD-10-CM | POA: Diagnosis not present

## 2013-05-22 DIAGNOSIS — B351 Tinea unguium: Secondary | ICD-10-CM | POA: Diagnosis not present

## 2013-05-22 DIAGNOSIS — M79609 Pain in unspecified limb: Secondary | ICD-10-CM | POA: Diagnosis not present

## 2013-05-26 DIAGNOSIS — M6281 Muscle weakness (generalized): Secondary | ICD-10-CM | POA: Diagnosis not present

## 2013-05-26 DIAGNOSIS — S42413A Displaced simple supracondylar fracture without intercondylar fracture of unspecified humerus, initial encounter for closed fracture: Secondary | ICD-10-CM | POA: Diagnosis not present

## 2013-05-27 DIAGNOSIS — M6281 Muscle weakness (generalized): Secondary | ICD-10-CM | POA: Diagnosis not present

## 2013-05-27 DIAGNOSIS — S42413A Displaced simple supracondylar fracture without intercondylar fracture of unspecified humerus, initial encounter for closed fracture: Secondary | ICD-10-CM | POA: Diagnosis not present

## 2013-06-01 DIAGNOSIS — M1710 Unilateral primary osteoarthritis, unspecified knee: Secondary | ICD-10-CM | POA: Diagnosis not present

## 2013-06-02 DIAGNOSIS — M6281 Muscle weakness (generalized): Secondary | ICD-10-CM | POA: Diagnosis not present

## 2013-06-02 DIAGNOSIS — S42413A Displaced simple supracondylar fracture without intercondylar fracture of unspecified humerus, initial encounter for closed fracture: Secondary | ICD-10-CM | POA: Diagnosis not present

## 2013-06-22 DIAGNOSIS — S42413A Displaced simple supracondylar fracture without intercondylar fracture of unspecified humerus, initial encounter for closed fracture: Secondary | ICD-10-CM | POA: Diagnosis not present

## 2013-06-22 DIAGNOSIS — M6281 Muscle weakness (generalized): Secondary | ICD-10-CM | POA: Diagnosis not present

## 2013-06-25 DIAGNOSIS — D4100 Neoplasm of uncertain behavior of unspecified kidney: Secondary | ICD-10-CM | POA: Diagnosis not present

## 2013-06-25 DIAGNOSIS — D4120 Neoplasm of uncertain behavior of unspecified ureter: Secondary | ICD-10-CM | POA: Diagnosis not present

## 2013-06-25 DIAGNOSIS — N390 Urinary tract infection, site not specified: Secondary | ICD-10-CM | POA: Diagnosis not present

## 2013-06-27 DIAGNOSIS — N390 Urinary tract infection, site not specified: Secondary | ICD-10-CM | POA: Diagnosis not present

## 2013-07-02 DIAGNOSIS — N390 Urinary tract infection, site not specified: Secondary | ICD-10-CM | POA: Diagnosis not present

## 2013-07-02 DIAGNOSIS — N318 Other neuromuscular dysfunction of bladder: Secondary | ICD-10-CM | POA: Diagnosis not present

## 2013-07-02 DIAGNOSIS — D4100 Neoplasm of uncertain behavior of unspecified kidney: Secondary | ICD-10-CM | POA: Diagnosis not present

## 2013-07-16 DIAGNOSIS — IMO0002 Reserved for concepts with insufficient information to code with codable children: Secondary | ICD-10-CM | POA: Diagnosis not present

## 2013-07-16 DIAGNOSIS — Z9181 History of falling: Secondary | ICD-10-CM | POA: Diagnosis not present

## 2013-07-16 DIAGNOSIS — H6120 Impacted cerumen, unspecified ear: Secondary | ICD-10-CM | POA: Diagnosis not present

## 2013-07-16 DIAGNOSIS — C649 Malignant neoplasm of unspecified kidney, except renal pelvis: Secondary | ICD-10-CM | POA: Diagnosis not present

## 2013-07-16 DIAGNOSIS — E785 Hyperlipidemia, unspecified: Secondary | ICD-10-CM | POA: Diagnosis not present

## 2013-07-16 DIAGNOSIS — I10 Essential (primary) hypertension: Secondary | ICD-10-CM | POA: Diagnosis not present

## 2013-07-16 DIAGNOSIS — E559 Vitamin D deficiency, unspecified: Secondary | ICD-10-CM | POA: Diagnosis not present

## 2013-07-16 DIAGNOSIS — Z1331 Encounter for screening for depression: Secondary | ICD-10-CM | POA: Diagnosis not present

## 2013-07-16 DIAGNOSIS — J019 Acute sinusitis, unspecified: Secondary | ICD-10-CM | POA: Diagnosis not present

## 2013-07-17 DIAGNOSIS — M79609 Pain in unspecified limb: Secondary | ICD-10-CM | POA: Diagnosis not present

## 2013-07-17 DIAGNOSIS — B351 Tinea unguium: Secondary | ICD-10-CM | POA: Diagnosis not present

## 2013-07-22 DIAGNOSIS — H4011X0 Primary open-angle glaucoma, stage unspecified: Secondary | ICD-10-CM | POA: Diagnosis not present

## 2013-07-22 DIAGNOSIS — H409 Unspecified glaucoma: Secondary | ICD-10-CM | POA: Diagnosis not present

## 2013-08-31 DIAGNOSIS — Z23 Encounter for immunization: Secondary | ICD-10-CM | POA: Diagnosis not present

## 2013-10-01 DIAGNOSIS — M169 Osteoarthritis of hip, unspecified: Secondary | ICD-10-CM | POA: Diagnosis not present

## 2013-10-01 DIAGNOSIS — M1710 Unilateral primary osteoarthritis, unspecified knee: Secondary | ICD-10-CM | POA: Diagnosis not present

## 2013-10-01 DIAGNOSIS — M1610 Unilateral primary osteoarthritis, unspecified hip: Secondary | ICD-10-CM | POA: Diagnosis not present

## 2013-10-12 DIAGNOSIS — M25569 Pain in unspecified knee: Secondary | ICD-10-CM | POA: Diagnosis not present

## 2013-10-14 DIAGNOSIS — M25569 Pain in unspecified knee: Secondary | ICD-10-CM | POA: Diagnosis not present

## 2013-10-21 DIAGNOSIS — M25569 Pain in unspecified knee: Secondary | ICD-10-CM | POA: Diagnosis not present

## 2013-10-28 DIAGNOSIS — M25569 Pain in unspecified knee: Secondary | ICD-10-CM | POA: Diagnosis not present

## 2013-10-30 DIAGNOSIS — M25569 Pain in unspecified knee: Secondary | ICD-10-CM | POA: Diagnosis not present

## 2013-11-03 DIAGNOSIS — M25569 Pain in unspecified knee: Secondary | ICD-10-CM | POA: Diagnosis not present

## 2013-11-04 DIAGNOSIS — H409 Unspecified glaucoma: Secondary | ICD-10-CM | POA: Diagnosis not present

## 2013-11-04 DIAGNOSIS — H4011X0 Primary open-angle glaucoma, stage unspecified: Secondary | ICD-10-CM | POA: Diagnosis not present

## 2013-11-05 DIAGNOSIS — M25569 Pain in unspecified knee: Secondary | ICD-10-CM | POA: Diagnosis not present

## 2013-11-06 DIAGNOSIS — M79609 Pain in unspecified limb: Secondary | ICD-10-CM | POA: Diagnosis not present

## 2013-11-06 DIAGNOSIS — B351 Tinea unguium: Secondary | ICD-10-CM | POA: Diagnosis not present

## 2013-11-09 DIAGNOSIS — M25569 Pain in unspecified knee: Secondary | ICD-10-CM | POA: Diagnosis not present

## 2013-11-18 DIAGNOSIS — M25569 Pain in unspecified knee: Secondary | ICD-10-CM | POA: Diagnosis not present

## 2013-12-29 DIAGNOSIS — R030 Elevated blood-pressure reading, without diagnosis of hypertension: Secondary | ICD-10-CM | POA: Diagnosis not present

## 2013-12-29 DIAGNOSIS — M25559 Pain in unspecified hip: Secondary | ICD-10-CM | POA: Diagnosis not present

## 2014-01-11 DIAGNOSIS — M1610 Unilateral primary osteoarthritis, unspecified hip: Secondary | ICD-10-CM | POA: Diagnosis not present

## 2014-01-11 DIAGNOSIS — M169 Osteoarthritis of hip, unspecified: Secondary | ICD-10-CM | POA: Diagnosis not present

## 2014-01-11 DIAGNOSIS — IMO0002 Reserved for concepts with insufficient information to code with codable children: Secondary | ICD-10-CM | POA: Diagnosis not present

## 2014-01-20 DIAGNOSIS — M25569 Pain in unspecified knee: Secondary | ICD-10-CM | POA: Diagnosis not present

## 2014-01-20 DIAGNOSIS — M25559 Pain in unspecified hip: Secondary | ICD-10-CM | POA: Diagnosis not present

## 2014-01-25 DIAGNOSIS — M25559 Pain in unspecified hip: Secondary | ICD-10-CM | POA: Diagnosis not present

## 2014-01-29 DIAGNOSIS — M79609 Pain in unspecified limb: Secondary | ICD-10-CM | POA: Diagnosis not present

## 2014-01-29 DIAGNOSIS — B351 Tinea unguium: Secondary | ICD-10-CM | POA: Diagnosis not present

## 2014-02-10 DIAGNOSIS — M25569 Pain in unspecified knee: Secondary | ICD-10-CM | POA: Diagnosis not present

## 2014-02-10 DIAGNOSIS — M84453A Pathological fracture, unspecified femur, initial encounter for fracture: Secondary | ICD-10-CM | POA: Diagnosis not present

## 2014-02-10 DIAGNOSIS — M169 Osteoarthritis of hip, unspecified: Secondary | ICD-10-CM | POA: Diagnosis not present

## 2014-02-10 DIAGNOSIS — M1610 Unilateral primary osteoarthritis, unspecified hip: Secondary | ICD-10-CM | POA: Diagnosis not present

## 2014-02-12 DIAGNOSIS — H409 Unspecified glaucoma: Secondary | ICD-10-CM | POA: Diagnosis not present

## 2014-02-12 DIAGNOSIS — H4011X0 Primary open-angle glaucoma, stage unspecified: Secondary | ICD-10-CM | POA: Diagnosis not present

## 2014-02-12 DIAGNOSIS — H524 Presbyopia: Secondary | ICD-10-CM | POA: Diagnosis not present

## 2014-03-30 DIAGNOSIS — M1610 Unilateral primary osteoarthritis, unspecified hip: Secondary | ICD-10-CM | POA: Diagnosis not present

## 2014-03-30 DIAGNOSIS — M1710 Unilateral primary osteoarthritis, unspecified knee: Secondary | ICD-10-CM | POA: Diagnosis not present

## 2014-04-09 DIAGNOSIS — B351 Tinea unguium: Secondary | ICD-10-CM | POA: Diagnosis not present

## 2014-04-09 DIAGNOSIS — M79609 Pain in unspecified limb: Secondary | ICD-10-CM | POA: Diagnosis not present

## 2014-04-27 DIAGNOSIS — M25559 Pain in unspecified hip: Secondary | ICD-10-CM | POA: Diagnosis not present

## 2014-05-18 DIAGNOSIS — M1610 Unilateral primary osteoarthritis, unspecified hip: Secondary | ICD-10-CM | POA: Diagnosis not present

## 2014-05-18 DIAGNOSIS — M1710 Unilateral primary osteoarthritis, unspecified knee: Secondary | ICD-10-CM | POA: Diagnosis not present

## 2014-05-18 DIAGNOSIS — M25569 Pain in unspecified knee: Secondary | ICD-10-CM | POA: Diagnosis not present

## 2014-05-20 DIAGNOSIS — H409 Unspecified glaucoma: Secondary | ICD-10-CM | POA: Diagnosis not present

## 2014-05-20 DIAGNOSIS — H4011X0 Primary open-angle glaucoma, stage unspecified: Secondary | ICD-10-CM | POA: Diagnosis not present

## 2014-06-04 DIAGNOSIS — J449 Chronic obstructive pulmonary disease, unspecified: Secondary | ICD-10-CM | POA: Diagnosis not present

## 2014-06-04 DIAGNOSIS — C649 Malignant neoplasm of unspecified kidney, except renal pelvis: Secondary | ICD-10-CM | POA: Diagnosis not present

## 2014-06-04 DIAGNOSIS — E785 Hyperlipidemia, unspecified: Secondary | ICD-10-CM | POA: Diagnosis not present

## 2014-06-04 DIAGNOSIS — IMO0002 Reserved for concepts with insufficient information to code with codable children: Secondary | ICD-10-CM | POA: Diagnosis not present

## 2014-06-04 DIAGNOSIS — Z1231 Encounter for screening mammogram for malignant neoplasm of breast: Secondary | ICD-10-CM | POA: Diagnosis not present

## 2014-06-04 DIAGNOSIS — R059 Cough, unspecified: Secondary | ICD-10-CM | POA: Diagnosis not present

## 2014-06-04 DIAGNOSIS — Z0000 Encounter for general adult medical examination without abnormal findings: Secondary | ICD-10-CM | POA: Diagnosis not present

## 2014-06-04 DIAGNOSIS — J4489 Other specified chronic obstructive pulmonary disease: Secondary | ICD-10-CM | POA: Diagnosis not present

## 2014-06-04 DIAGNOSIS — I10 Essential (primary) hypertension: Secondary | ICD-10-CM | POA: Diagnosis not present

## 2014-06-04 DIAGNOSIS — E559 Vitamin D deficiency, unspecified: Secondary | ICD-10-CM | POA: Diagnosis not present

## 2014-06-11 DIAGNOSIS — B351 Tinea unguium: Secondary | ICD-10-CM | POA: Diagnosis not present

## 2014-06-11 DIAGNOSIS — M79609 Pain in unspecified limb: Secondary | ICD-10-CM | POA: Diagnosis not present

## 2014-06-14 DIAGNOSIS — J984 Other disorders of lung: Secondary | ICD-10-CM | POA: Diagnosis not present

## 2014-06-14 DIAGNOSIS — I2510 Atherosclerotic heart disease of native coronary artery without angina pectoris: Secondary | ICD-10-CM | POA: Diagnosis not present

## 2014-06-14 DIAGNOSIS — R9389 Abnormal findings on diagnostic imaging of other specified body structures: Secondary | ICD-10-CM | POA: Diagnosis not present

## 2014-06-14 DIAGNOSIS — R059 Cough, unspecified: Secondary | ICD-10-CM | POA: Diagnosis not present

## 2014-06-24 ENCOUNTER — Other Ambulatory Visit: Payer: Self-pay | Admitting: Orthopedic Surgery

## 2014-06-24 NOTE — Progress Notes (Signed)
Preoperative surgical orders have been place into the Epic hospital system for Christy Houston on 06/24/2014, 10:44 AM  by Mickel Crow for surgery on 08/04/2014.  Preop Total Hip - Anterior Approach orders including Experel Injecion, IV Tylenol, and IV Decadron as long as there are no contraindications to the above medications. [name], PA-C

## 2014-07-02 DIAGNOSIS — D4100 Neoplasm of uncertain behavior of unspecified kidney: Secondary | ICD-10-CM | POA: Diagnosis not present

## 2014-07-02 DIAGNOSIS — N390 Urinary tract infection, site not specified: Secondary | ICD-10-CM | POA: Diagnosis not present

## 2014-07-02 DIAGNOSIS — D4120 Neoplasm of uncertain behavior of unspecified ureter: Secondary | ICD-10-CM | POA: Diagnosis not present

## 2014-07-12 DIAGNOSIS — D4120 Neoplasm of uncertain behavior of unspecified ureter: Secondary | ICD-10-CM | POA: Diagnosis not present

## 2014-07-12 DIAGNOSIS — D4100 Neoplasm of uncertain behavior of unspecified kidney: Secondary | ICD-10-CM | POA: Diagnosis not present

## 2014-07-12 DIAGNOSIS — N390 Urinary tract infection, site not specified: Secondary | ICD-10-CM | POA: Diagnosis not present

## 2014-07-12 DIAGNOSIS — N318 Other neuromuscular dysfunction of bladder: Secondary | ICD-10-CM | POA: Diagnosis not present

## 2014-07-20 DIAGNOSIS — R112 Nausea with vomiting, unspecified: Secondary | ICD-10-CM

## 2014-07-20 DIAGNOSIS — Z9889 Other specified postprocedural states: Secondary | ICD-10-CM

## 2014-07-20 HISTORY — DX: Nausea with vomiting, unspecified: R11.2

## 2014-07-20 HISTORY — DX: Other specified postprocedural states: Z98.890

## 2014-07-28 ENCOUNTER — Encounter (HOSPITAL_COMMUNITY): Payer: Self-pay | Admitting: Pharmacy Technician

## 2014-07-30 ENCOUNTER — Encounter (HOSPITAL_COMMUNITY)
Admission: RE | Admit: 2014-07-30 | Discharge: 2014-07-30 | Disposition: A | Discharge status: Home / Self Care | Payer: Medicare Other | Source: Ambulatory Visit | Attending: Orthopedic Surgery | Admitting: Orthopedic Surgery

## 2014-07-30 ENCOUNTER — Encounter (HOSPITAL_COMMUNITY): Payer: Self-pay

## 2014-07-30 ENCOUNTER — Ambulatory Visit (HOSPITAL_COMMUNITY)
Admission: RE | Admit: 2014-07-30 | Discharge: 2014-07-30 | Disposition: A | Discharge status: Home / Self Care | Payer: Medicare Other | Source: Ambulatory Visit | Attending: Orthopedic Surgery | Admitting: Orthopedic Surgery

## 2014-07-30 DIAGNOSIS — M169 Osteoarthritis of hip, unspecified: Secondary | ICD-10-CM | POA: Insufficient documentation

## 2014-07-30 DIAGNOSIS — M1610 Unilateral primary osteoarthritis, unspecified hip: Secondary | ICD-10-CM | POA: Diagnosis not present

## 2014-07-30 DIAGNOSIS — M25559 Pain in unspecified hip: Secondary | ICD-10-CM | POA: Insufficient documentation

## 2014-07-30 HISTORY — DX: Unspecified glaucoma: H40.9

## 2014-07-30 HISTORY — DX: Unspecified multiple injuries, initial encounter: T07.XXXA

## 2014-07-30 HISTORY — DX: Malignant (primary) neoplasm, unspecified: C80.1

## 2014-07-30 HISTORY — DX: Pure hypercholesterolemia, unspecified: E78.00

## 2014-07-30 HISTORY — DX: Unspecified osteoarthritis, unspecified site: M19.90

## 2014-07-30 HISTORY — DX: Essential (primary) hypertension: I10

## 2014-07-30 LAB — APTT: aPTT: 31 seconds (ref 24–37)

## 2014-07-30 LAB — COMPREHENSIVE METABOLIC PANEL
ALBUMIN: 3.6 g/dL (ref 3.5–5.2)
ALT: 13 U/L (ref 0–35)
AST: 21 U/L (ref 0–37)
Alkaline Phosphatase: 73 U/L (ref 39–117)
Anion gap: 10 (ref 5–15)
BUN: 18 mg/dL (ref 6–23)
CO2: 26 mEq/L (ref 19–32)
Calcium: 10 mg/dL (ref 8.4–10.5)
Chloride: 101 mEq/L (ref 96–112)
Creatinine, Ser: 0.72 mg/dL (ref 0.50–1.10)
GFR calc non Af Amer: 76 mL/min — ABNORMAL LOW (ref >90)
GFR, EST AFRICAN AMERICAN: 88 mL/min — AB (ref >90)
GLUCOSE: 90 mg/dL (ref 70–99)
Potassium: 4.9 mEq/L (ref 3.7–5.3)
Sodium: 137 mEq/L (ref 137–147)
TOTAL PROTEIN: 7 g/dL (ref 6.0–8.3)
Total Bilirubin: 0.4 mg/dL (ref 0.3–1.2)

## 2014-07-30 LAB — URINALYSIS, ROUTINE W REFLEX MICROSCOPIC
BILIRUBIN URINE: NEGATIVE
GLUCOSE, UA: NEGATIVE mg/dL
HGB URINE DIPSTICK: NEGATIVE
Ketones, ur: NEGATIVE mg/dL
Nitrite: NEGATIVE
Protein, ur: NEGATIVE mg/dL
SPECIFIC GRAVITY, URINE: 1.019 (ref 1.005–1.030)
UROBILINOGEN UA: 0.2 mg/dL (ref 0.0–1.0)
pH: 6 (ref 5.0–8.0)

## 2014-07-30 LAB — CBC
HEMATOCRIT: 40 % (ref 36.0–46.0)
Hemoglobin: 13.4 g/dL (ref 12.0–15.0)
MCH: 29.6 pg (ref 26.0–34.0)
MCHC: 33.5 g/dL (ref 30.0–36.0)
MCV: 88.5 fL (ref 78.0–100.0)
Platelets: 204 10*3/uL (ref 150–400)
RBC: 4.52 MIL/uL (ref 3.87–5.11)
RDW: 13 % (ref 11.5–15.5)
WBC: 5.5 10*3/uL (ref 4.0–10.5)

## 2014-07-30 LAB — SURGICAL PCR SCREEN
MRSA, PCR: NEGATIVE
STAPHYLOCOCCUS AUREUS: NEGATIVE

## 2014-07-30 LAB — URINE MICROSCOPIC-ADD ON

## 2014-07-30 LAB — PROTIME-INR
INR: 1.04 (ref 0.00–1.49)
PROTHROMBIN TIME: 13.6 s (ref 11.6–15.2)

## 2014-07-30 NOTE — Patient Instructions (Signed)
[place]  07/30/2014   Your procedure is scheduled on:  9-16 -2015  Enter through [facility] Entrance and follow signs to [facility]. Arrive at   1215      PM.  Call this number if you have problems the morning of surgery: [phone]  Or Presurgical Testing [phone].   For Living Will and/or Health Care Power Attorney Forms: please provide copy for your medical record,may bring AM of surgery(Forms should be already notarized -we do not provide this service).(07-30-14 No information preferred today).    Do not eat food/ or drink: After Midnight.  Exception: may have clear liquids:up to 6 Hours before arrival. Nothing after: 0900 AM  Clear liquids include soda, tea, black coffee, apple or grape juice, broth.  Take these medicines the morning of surgery with A SIP OF WATER: NONE- bring eye drops , use as usual.    Do not wear jewelry, make-up or nail polish.  Do not wear lotions, powders, or perfumes. You may not  wear deodorant.  Do not shave 48 hours(2 days) prior to first CHG shower(legs and under arms).(Shaving face and neck okay.)  Do not bring valuables to the hospital.(Hospital is not responsible for lost valuables).  Contacts, dentures or removable bridgework, body piercing, hair pins may not be worn into surgery.  Leave suitcase in the car. After surgery it may be brought to your room.  For patients admitted to the hospital, checkout time is 11:00 AM the day of discharge.(Restricted visitors-Any Persons displaying flu-like symptoms or illness).    Patients discharged the day of surgery will not be allowed to drive home. Must have responsible person with you x 24 hours once discharged.  Name and phone number of your driver: Yisroel Ramming, daughter  Special Instructions: CHG(Chlorhedine 4%-"Hibiclens","Betasept","Aplicare") Environmental consultant Wash: see special instructions.(avoid face and genitals)   Please read over the following fact sheets that  you were given: MRSA Information, Blood Transfusion fact sheet, Incentive Spirometry Instruction.  Remember : Type/Screen "Blue armbands" - may not be removed once applied(would result in being retested AM of surgery, if removed).        CLEAR LIQUID DIET   Foods Allowed                                                                     Foods Excluded  Coffee and tea, regular and decaf                             liquids that you cannot  Plain Jell-O in any flavor                                             see through such as: Fruit ices (not with fruit pulp)                                     milk, soups, orange juice  Iced Popsicles  All solid food Carbonated beverages, regular and diet                                    Cranberry, grape and apple juices Sports drinks like Gatorade Lightly seasoned clear broth or consume(fat free) Sugar, honey syrup  Sample Menu Breakfast                                Lunch                                     Supper Cranberry juice                    Beef broth                            Chicken broth Jell-O                                     Grape juice                           Apple juice Coffee or tea                        Jell-O                                      Popsicle                                                Coffee or tea                        Coffee or tea  _____________________________________________________________________  [facility] - Preparing for Surgery Before surgery, you can play an important role.  Because skin is not sterile, your skin needs to be as free of germs as possible.  You can reduce the number of germs on your skin by washing with CHG (chlorahexidine gluconate) soap before surgery.  CHG is an antiseptic cleaner which kills germs and bonds with the skin to continue killing germs even after washing. Please DO NOT use if you have an allergy to CHG or antibacterial soaps.   If your skin becomes reddened/irritated stop using the CHG and inform your nurse when you arrive at Short Stay. Do not shave (including legs and underarms) for at least 48 hours prior to the first CHG shower.  You may shave your face/neck. Please follow these instructions carefully:  1.  Shower with CHG Soap the night before surgery and the  morning of Surgery.  2.  If you choose to wash your hair, wash your hair first as usual with your  normal  shampoo.  3.  After you shampoo, rinse your hair and body thoroughly to remove the  shampoo.  4.  Use CHG as you would any other liquid soap.  You can apply chg directly  to the skin and wash                       Gently with a scrungie or clean washcloth.  5.  Apply the CHG Soap to your body ONLY FROM THE NECK DOWN.   Do not use on face/ open                           Wound or open sores. Avoid contact with eyes, ears mouth and genitals (private parts).                       Wash face,  Genitals (private parts) with your normal soap.             6.  Wash thoroughly, paying special attention to the area where your surgery  will be performed.  7.  Thoroughly rinse your body with warm water from the neck down.  8.  DO NOT shower/wash with your normal soap after using and rinsing off  the CHG Soap.                9.  Pat yourself dry with a clean towel.            10.  Wear clean pajamas.            11.  Place clean sheets on your bed the night of your first shower and do not  sleep with pets. Day of Surgery : Do not apply any lotions/deodorants the morning of surgery.  Please wear clean clothes to the hospital/surgery center.  FAILURE TO FOLLOW THESE INSTRUCTIONS MAY RESULT IN THE CANCELLATION OF YOUR SURGERY PATIENT SIGNATURE_________________________________  NURSE SIGNATURE__________________________________  ________________________________________________________________________   [name]  An incentive  spirometer is a tool that can help keep your lungs clear and active. This tool measures how well you are filling your lungs with each breath. Taking long deep breaths may help reverse or decrease the chance of developing breathing (pulmonary) problems (especially infection) following:  A long period of time when you are unable to move or be active. BEFORE THE PROCEDURE   If the spirometer includes an indicator to show your best effort, your nurse or respiratory therapist will set it to a desired goal.  If possible, sit up straight or lean slightly forward. Try not to slouch.  Hold the incentive spirometer in an upright position. INSTRUCTIONS FOR USE  1. Sit on the edge of your bed if possible, or sit up as far as you can in bed or on a chair. 2. Hold the incentive spirometer in an upright position. 3. Breathe out normally. 4. Place the mouthpiece in your mouth and seal your lips tightly around it. 5. Breathe in slowly and as deeply as possible, raising the piston or the ball toward the top of the column. 6. Hold your breath for 3-5 seconds or for as long as possible. Allow the piston or ball to fall to the bottom of the column. 7. Remove the mouthpiece from your mouth and breathe out normally. 8. Rest for a few seconds and repeat Steps 1 through 7 at least 10 times every 1-2 hours when you are awake. Take your time and take a few normal breaths between deep breaths. 9. The spirometer may include an indicator to show  your best effort. Use the indicator as a goal to work toward during each repetition. 10. After each set of 10 deep breaths, practice coughing to be sure your lungs are clear. If you have an incision (the cut made at the time of surgery), support your incision when coughing by placing a pillow or rolled up towels firmly against it. Once you are able to get out of bed, walk around indoors and cough well. You may stop using the incentive spirometer when instructed by your caregiver.   RISKS AND COMPLICATIONS  Take your time so you do not get dizzy or light-headed.  If you are in pain, you may need to take or ask for pain medication before doing incentive spirometry. It is harder to take a deep breath if you are having pain. AFTER USE  Rest and breathe slowly and easily.  It can be helpful to keep track of a log of your progress. Your caregiver can provide you with a simple table to help with this. If you are using the spirometer at home, follow these instructions: [place] IF:   You are having difficultly using the spirometer.  You have trouble using the spirometer as often as instructed.  Your pain medication is not giving enough relief while using the spirometer.  You develop fever of 100.5 F (38.1 C) or higher. SEEK IMMEDIATE MEDICAL CARE IF:   You cough up bloody sputum that had not been present before.  You develop fever of 102 F (38.9 C) or greater.  You develop worsening pain at or near the incision site. MAKE SURE YOU:   Understand these instructions.  Will watch your condition.  Will get help right away if you are not doing well or get worse. Document Released: 03/18/2007 Document Revised: 01/28/2012 Document Reviewed: 05/19/2007 ExitCare Patient Information 2014 ExitCare, [place].   ________________________________________________________________________  WHAT IS A BLOOD TRANSFUSION? Blood Transfusion Information  A transfusion is the replacement of blood or some of its parts. Blood is made up of multiple cells which provide different functions.  Red blood cells carry oxygen and are used for blood loss replacement.  White blood cells fight against infection.  Platelets control bleeding.  Plasma helps clot blood.  Other blood products are available for specialized needs, such as hemophilia or other clotting disorders. BEFORE THE TRANSFUSION  Who gives blood for transfusions?   Healthy volunteers who are fully evaluated  to make sure their blood is safe. This is blood bank blood. Transfusion therapy is the safest it has ever been in the practice of medicine. Before blood is taken from a donor, a complete history is taken to make sure that person has no history of diseases nor engages in risky social behavior (examples are intravenous drug use or sexual activity with multiple partners). The donor's travel history is screened to minimize risk of transmitting infections, such as malaria. The donated blood is tested for signs of infectious diseases, such as HIV and hepatitis. The blood is then tested to be sure it is compatible with you in order to minimize the chance of a transfusion reaction. If you or a relative donates blood, this is often done in anticipation of surgery and is not appropriate for emergency situations. It takes many days to process the donated blood. RISKS AND COMPLICATIONS Although transfusion therapy is very safe and saves many lives, the main dangers of transfusion include:   Getting an infectious disease.  Developing a transfusion reaction. This is an allergic reaction to  something in the blood you were given. Every precaution is taken to prevent this. The decision to have a blood transfusion has been considered carefully by your caregiver before blood is given. Blood is not given unless the benefits outweigh the risks. AFTER THE TRANSFUSION  Right after receiving a blood transfusion, you will usually feel much better and more energetic. This is especially true if your red blood cells have gotten low (anemic). The transfusion raises the level of the red blood cells which carry oxygen, and this usually causes an energy increase.  The nurse administering the transfusion will monitor you carefully for complications. HOME CARE INSTRUCTIONS  No special instructions are needed after a transfusion. You may find your energy is better. Speak with your caregiver about any limitations on activity for  underlying diseases you may have. SEEK MEDICAL CARE IF:   Your condition is not improving after your transfusion.  You develop redness or irritation at the intravenous (IV) site. SEEK IMMEDIATE MEDICAL CARE IF:  Any of the following symptoms occur over the next 12 hours:  Shaking chills.  You have a temperature by mouth above 102 F (38.9 C), not controlled by medicine.  Chest, back, or muscle pain.  People around you feel you are not acting correctly or are confused.  Shortness of breath or difficulty breathing.  Dizziness and fainting.  You get a rash or develop hives.  You have a decrease in urine output.  Your urine turns a dark color or changes to pink, red, or brown. Any of the following symptoms occur over the next 10 days:  You have a temperature by mouth above 102 F (38.9 C), not controlled by medicine.  Shortness of breath.  Weakness after normal activity.  The white part of the eye turns yellow (jaundice).  You have a decrease in the amount of urine or are urinating less often.  Your urine turns a dark color or changes to pink, red, or brown. Document Released: 11/02/2000 Document Revised: 01/28/2012 Document Reviewed: 06/21/2008 [facility] Patient Information 2014 [place], [place].  _______________________________________________________________________

## 2014-07-30 NOTE — Pre-Procedure Instructions (Addendum)
07-30-14 EKG 7'15, Clearance note (DrDelena Bali) 06-04-14 with chart. CXR 45'15 [facility]) report with chart. Rt. Hip Xray done today

## 2014-08-03 ENCOUNTER — Other Ambulatory Visit: Payer: Self-pay | Admitting: Orthopedic Surgery

## 2014-08-03 NOTE — H&P (Signed)
Christy Houston   DOB: 04/12/1926  Widowed / Language: Cleophus Molt / Race: White  Female Date of Admission: 08/04/2014 Chief complaint:  Right Hip Pain  History of Present Illness The patient is a 80 year old female who comes in for a preoperative history and physical. The patient is scheduled for a right total hip arthroplasty (anterior approach) to be performed by Dr. Dione Plover. [name], MD at [facility] on 08/04/2014.  The patient is a 80 year old female who presents for follow up of their hip. The patient is being followed for their right hip pain and osteoarthritis. They are several week(s) out from last visit. Symptoms reported today include: pain. The patient feels that they are not doing better. The following medication has been used for pain control: Tylenol (prn). The patient presents today following IA injection with Dr.Ramos.  Unfortunately the injection did not provide any long lasting benefit. She is at a stage now where her hip is getting progressively worse. She is ready to go ahead and get it fixed.  They have been treated conservatively in the past for the above stated problem and despite conservative measures, they continue to have progressive pain and severe functional limitations and dysfunction. They have failed non-operative management including home exercise, medications, and injections. It is felt that they would benefit from undergoing total joint replacement. Risks and benefits of the procedure have been discussed with the patient and they elect to proceed with surgery. There are no active contraindications to surgery such as ongoing infection or rapidly progressive neurological disease.   Problem List/Past Medical  Insufficiency fracture of femur (733.14  M84.453A)  Aftercare following joint replacement surgery (V54.81  Z47.1)  S/P Left total hip arthroplasty (V43.64) (V43.64  Z96.649)  Primary osteoarthritis of both knees (715.16  M17.0)   Hypercholesterolemia  High blood pressure  Glaucoma  Cataract  Measles  Mumps  Menopause   Allergies  No Known Drug Allergies  Family History Hypertension Brother. brother   Social History Alcohol use current drinker; drinks wine; less than 5 per week  Tobacco use Former smoker. former smoker; smoke(d) 3/4 pack(s) per day  Pain Contract no  Tobacco / smoke exposure no  Post-Surgical Plans She wants to go into the skilled unit at [facility].  [place]  Marital status Widowed.  Living situation Lives alone.   Medication History Astepro (0.15% Solution, Nasal) Active. (prn)  Restora (Oral) Active.  Grape Seed Extract (50MG  Tablet, Oral) Active.  Calcium (500MG  Tablet, Oral) Active.  Vitamin D (1000UNIT Tablet, Oral) Active.  Glucosamine HCl (Oral) Specific dose unknown - Active. (witih Condroitin)  Aspirin EC (81MG  Tablet DR, Oral) Active.  Travatan Z (0.004% Solution, Ophthalmic) Active.  Multiple Vitamins (Oral) Specific dose unknown - Active.  Lipitor (10MG  Tablet, Oral) Active.  Diovan (320MG  Tablet, Oral) Active.   Past Surgical History Hysterectomy Date: 10/1968. partial (non-cancerous)  Kidney Removal Date: 04/2008. right  Total Hip Replacement Date: 04/01/2006. left    Review of Systems General Not Present- Chills, Fatigue, Fever, Memory Loss, Night Sweats, Weight Gain and Weight Loss.  Skin Not Present- Eczema, Hives, Itching, Lesions and Rash.  HEENT Not Present- Dentures, Double Vision, Headache, Hearing Loss, Tinnitus and Visual Loss.  Respiratory Not Present- Allergies, Chronic Cough, Coughing up blood, Shortness of breath at rest and Shortness of breath with exertion. Cardiovascular Not Present- Chest Pain, Difficulty Breathing Lying Down, Murmur, Palpitations, Racing/skipping heartbeats and Swelling.  Gastrointestinal Not Present- Abdominal Pain, Bloody Stool, Constipation,  Diarrhea, Difficulty Swallowing, Heartburn, Jaundice, Loss  of appetitie, Nausea and Vomiting.  Female Genitourinary Present- Urinating at Night. Not Present- Blood in Urine, Discharge, Flank Pain, Incontinence, Painful Urination, Urgency, Urinary frequency, Urinary Retention and Weak urinary stream.  Musculoskeletal Present- Joint Pain. Not Present- Back Pain, Joint Swelling, Morning Stiffness, Muscle Pain, Muscle Weakness and Spasms.  Neurological Not Present- Blackout spells, Difficulty with balance, Dizziness, Paralysis, Tremor and Weakness.  Psychiatric Not Present- Insomnia.  Vitals Weight: 140 lb Height: 66 in  Weight was reported by patient.  Height was reported by patient.  Body Surface Area: 1.72 m Body Mass Index: 22.6 kg/m  BP: 124/76 (Sitting, Left Arm, Standard)   Physical Exam General  Mental Status - Alert, cooperative and good historian.  General Appearance - pleasant, Not in acute distress.  Orientation - Oriented X3.  Build & Nutrition - Well nourished and Well developed.  Head and Neck  Head - normocephalic, atraumatic .  Neck  Global Assessment - supple, no bruit auscultated on the right, no bruit auscultated on the left.  Note: small partial dental plate  Eye  Pupil - Bilateral - Regular and Round.  Motion - Bilateral - EOMI.  Chest and Lung Exam  Auscultation  Breath sounds - clear at anterior chest wall and clear at posterior chest wall. Adventitious sounds - No Adventitious sounds.  Cardiovascular  Auscultation  Rhythm - Regular rate and rhythm. Heart Sounds - S1 WNL and S2 WNL. Murmurs & Other Heart Sounds - Auscultation of the heart reveals - No Murmurs.  Abdomen  Palpation/Percussion  Tenderness - Abdomen is non-tender to palpation. Rigidity (guarding) - Abdomen is soft.  Auscultation  Auscultation of the abdomen reveals - Bowel sounds normal.  Female Genitourinary  Note: Not done, not pertinent to present illness  Musculoskeletal  Note: She is alert and oriented. No apparent distress. The right hip  flexed to 90. No internal rotation. About 20-30 external rotation and 30 abduction.  RADIOGRAPHS:  X-rays once again and she has bone on bone arthritis throughout the hip with subchondral cystic formation.   Assessment & Plan  Osteoarthritis of right hip, unspecified osteoarthritis type (715.95  M16.11)  Note: Plan is for a Right Total Hip Replacement - Anterior Approach by Dr. Wynelle Link.  Plan is to go to the skilled unit at [facility].  PCP - Dr. Delena Bali  The patient does not have any contraindications and will receive TXA (tranexamic acid) prior to surgery.  Signed electronically by Joelene Millin, III PA-C

## 2014-08-04 ENCOUNTER — Encounter (HOSPITAL_COMMUNITY)
Admission: RE | Disposition: A | Discharge status: Skilled Nursing Facility | Payer: Self-pay | Source: Ambulatory Visit | Attending: Orthopedic Surgery

## 2014-08-04 ENCOUNTER — Inpatient Hospital Stay (HOSPITAL_COMMUNITY): Payer: Medicare Other

## 2014-08-04 ENCOUNTER — Encounter (HOSPITAL_COMMUNITY): Payer: Self-pay | Admitting: *Deleted

## 2014-08-04 ENCOUNTER — Encounter (HOSPITAL_COMMUNITY): Payer: Medicare Other | Admitting: Anesthesiology

## 2014-08-04 ENCOUNTER — Inpatient Hospital Stay (HOSPITAL_COMMUNITY): Payer: Medicare Other | Admitting: Anesthesiology

## 2014-08-04 ENCOUNTER — Inpatient Hospital Stay (HOSPITAL_COMMUNITY)
Admission: RE | Admit: 2014-08-04 | Discharge: 2014-08-07 | DRG: 470 | Disposition: A | Discharge status: Skilled Nursing Facility | Payer: Medicare Other | Source: Ambulatory Visit | Attending: Orthopedic Surgery | Admitting: Orthopedic Surgery

## 2014-08-04 DIAGNOSIS — K5900 Constipation, unspecified: Secondary | ICD-10-CM | POA: Diagnosis not present

## 2014-08-04 DIAGNOSIS — M169 Osteoarthritis of hip, unspecified: Secondary | ICD-10-CM | POA: Diagnosis present

## 2014-08-04 DIAGNOSIS — Z471 Aftercare following joint replacement surgery: Secondary | ICD-10-CM | POA: Diagnosis not present

## 2014-08-04 DIAGNOSIS — S79919A Unspecified injury of unspecified hip, initial encounter: Secondary | ICD-10-CM | POA: Diagnosis not present

## 2014-08-04 DIAGNOSIS — Z87891 Personal history of nicotine dependence: Secondary | ICD-10-CM

## 2014-08-04 DIAGNOSIS — M1610 Unilateral primary osteoarthritis, unspecified hip: Principal | ICD-10-CM | POA: Diagnosis present

## 2014-08-04 DIAGNOSIS — I10 Essential (primary) hypertension: Secondary | ICD-10-CM | POA: Diagnosis present

## 2014-08-04 DIAGNOSIS — E7800 Pure hypercholesterolemia, unspecified: Secondary | ICD-10-CM | POA: Diagnosis present

## 2014-08-04 DIAGNOSIS — H409 Unspecified glaucoma: Secondary | ICD-10-CM | POA: Diagnosis not present

## 2014-08-04 DIAGNOSIS — Z85828 Personal history of other malignant neoplasm of skin: Secondary | ICD-10-CM | POA: Diagnosis not present

## 2014-08-04 DIAGNOSIS — M1611 Unilateral primary osteoarthritis, right hip: Secondary | ICD-10-CM

## 2014-08-04 DIAGNOSIS — Z5189 Encounter for other specified aftercare: Secondary | ICD-10-CM | POA: Diagnosis not present

## 2014-08-04 DIAGNOSIS — M25559 Pain in unspecified hip: Secondary | ICD-10-CM | POA: Diagnosis not present

## 2014-08-04 DIAGNOSIS — R110 Nausea: Secondary | ICD-10-CM | POA: Diagnosis not present

## 2014-08-04 DIAGNOSIS — Z96649 Presence of unspecified artificial hip joint: Secondary | ICD-10-CM | POA: Diagnosis not present

## 2014-08-04 DIAGNOSIS — Z85528 Personal history of other malignant neoplasm of kidney: Secondary | ICD-10-CM | POA: Diagnosis not present

## 2014-08-04 HISTORY — PX: TOTAL HIP ARTHROPLASTY: SHX124

## 2014-08-04 LAB — TYPE AND SCREEN
ABO/RH(D): A POS
ANTIBODY SCREEN: NEGATIVE

## 2014-08-04 SURGERY — ARTHROPLASTY, HIP, TOTAL, ANTERIOR APPROACH
Anesthesia: Spinal | Site: Hip | Laterality: Right

## 2014-08-04 MED ORDER — BUPIVACAINE HCL (PF) 0.25 % IJ SOLN
INTRAMUSCULAR | Status: AC
  Filled 2014-08-04: qty 30

## 2014-08-04 MED ORDER — BISACODYL 10 MG RE SUPP
10.0000 mg | Freq: Every day | RECTAL | Status: DC | PRN
  Filled 2014-08-04: qty 1

## 2014-08-04 MED ORDER — RIVAROXABAN 10 MG PO TABS
10.0000 mg | ORAL_TABLET | Freq: Every day | ORAL | Status: DC
  Administered 2014-08-05 – 2014-08-07 (×3): 10 mg via ORAL
  Filled 2014-08-04 (×4): qty 1

## 2014-08-04 MED ORDER — DEXAMETHASONE SODIUM PHOSPHATE 10 MG/ML IJ SOLN
10.0000 mg | Freq: Once | INTRAMUSCULAR | Status: AC
  Administered 2014-08-04: 10 mg via INTRAVENOUS

## 2014-08-04 MED ORDER — METHOCARBAMOL 1000 MG/10ML IJ SOLN
500.0000 mg | Freq: Four times a day (QID) | INTRAVENOUS | Status: DC | PRN
  Administered 2014-08-04: 500 mg via INTRAVENOUS
  Filled 2014-08-04: qty 5

## 2014-08-04 MED ORDER — PROPOFOL 10 MG/ML IV EMUL
INTRAVENOUS | Status: DC | PRN
  Administered 2014-08-04: 30 mg via INTRAVENOUS

## 2014-08-04 MED ORDER — BUPIVACAINE HCL (PF) 0.25 % IJ SOLN
INTRAMUSCULAR | Status: DC | PRN
  Administered 2014-08-04: 20 mL

## 2014-08-04 MED ORDER — MORPHINE SULFATE 2 MG/ML IJ SOLN
1.0000 mg | INTRAMUSCULAR | Status: DC | PRN
  Administered 2014-08-04 (×2): 1 mg via INTRAVENOUS
  Filled 2014-08-04 (×2): qty 1

## 2014-08-04 MED ORDER — LIP MEDEX EX OINT
TOPICAL_OINTMENT | CUTANEOUS | Status: AC
  Administered 2014-08-04: 23:00:00
  Filled 2014-08-04: qty 7

## 2014-08-04 MED ORDER — GLYCOPYRROLATE 0.2 MG/ML IJ SOLN
INTRAMUSCULAR | Status: DC | PRN
  Administered 2014-08-04: 0.2 mg via INTRAVENOUS

## 2014-08-04 MED ORDER — ATORVASTATIN CALCIUM 10 MG PO TABS
10.0000 mg | ORAL_TABLET | Freq: Every day | ORAL | Status: DC
  Administered 2014-08-04 – 2014-08-06 (×3): 10 mg via ORAL
  Filled 2014-08-04 (×4): qty 1

## 2014-08-04 MED ORDER — CEFAZOLIN SODIUM-DEXTROSE 2-3 GM-% IV SOLR
INTRAVENOUS | Status: AC
  Filled 2014-08-04: qty 50

## 2014-08-04 MED ORDER — TRAMADOL HCL 50 MG PO TABS
50.0000 mg | ORAL_TABLET | Freq: Four times a day (QID) | ORAL | Status: DC | PRN
  Administered 2014-08-05: 50 mg via ORAL
  Administered 2014-08-06 – 2014-08-07 (×2): 100 mg via ORAL
  Filled 2014-08-04 (×2): qty 2
  Filled 2014-08-04: qty 1

## 2014-08-04 MED ORDER — DOCUSATE SODIUM 100 MG PO CAPS
100.0000 mg | ORAL_CAPSULE | Freq: Two times a day (BID) | ORAL | Status: DC
  Administered 2014-08-04 – 2014-08-07 (×5): 100 mg via ORAL

## 2014-08-04 MED ORDER — ACETAMINOPHEN 10 MG/ML IV SOLN
1000.0000 mg | Freq: Once | INTRAVENOUS | Status: AC
  Administered 2014-08-04: 1000 mg via INTRAVENOUS
  Filled 2014-08-04: qty 100

## 2014-08-04 MED ORDER — OXYCODONE HCL 5 MG PO TABS
5.0000 mg | ORAL_TABLET | ORAL | Status: DC | PRN
  Administered 2014-08-05: 5 mg via ORAL
  Administered 2014-08-05: 10 mg via ORAL
  Administered 2014-08-05: 5 mg via ORAL
  Administered 2014-08-06: 10 mg via ORAL
  Filled 2014-08-04: qty 1
  Filled 2014-08-04 (×2): qty 2
  Filled 2014-08-04: qty 1

## 2014-08-04 MED ORDER — CEFAZOLIN SODIUM-DEXTROSE 2-3 GM-% IV SOLR
2.0000 g | INTRAVENOUS | Status: AC
  Administered 2014-08-04: 2 g via INTRAVENOUS

## 2014-08-04 MED ORDER — LACTATED RINGERS IV SOLN
INTRAVENOUS | Status: DC
  Administered 2014-08-04: 1000 mL via INTRAVENOUS

## 2014-08-04 MED ORDER — TRAVOPROST 0.004 % OP SOLN: 1.0000 [drp] | Freq: Every day | OPHTHALMIC | Status: DC

## 2014-08-04 MED ORDER — ONDANSETRON HCL 4 MG PO TABS
4.0000 mg | ORAL_TABLET | Freq: Four times a day (QID) | ORAL | Status: DC | PRN
  Administered 2014-08-06: 4 mg via ORAL
  Filled 2014-08-04: qty 1

## 2014-08-04 MED ORDER — MENTHOL 3 MG MT LOZG
1.0000 | LOZENGE | OROMUCOSAL | Status: DC | PRN
  Filled 2014-08-04: qty 9

## 2014-08-04 MED ORDER — METHOCARBAMOL 500 MG PO TABS
500.0000 mg | ORAL_TABLET | Freq: Four times a day (QID) | ORAL | Status: DC | PRN
  Administered 2014-08-05 – 2014-08-07 (×4): 500 mg via ORAL
  Filled 2014-08-04 (×4): qty 1

## 2014-08-04 MED ORDER — ATROPINE SULFATE 0.4 MG/ML IJ SOLN
INTRAMUSCULAR | Status: DC | PRN
  Administered 2014-08-04: .6 mg via INTRAVENOUS

## 2014-08-04 MED ORDER — BUPIVACAINE LIPOSOME 1.3 % IJ SUSP
20.0000 mL | Freq: Once | INTRAMUSCULAR | Status: DC
  Filled 2014-08-04: qty 20

## 2014-08-04 MED ORDER — SODIUM CHLORIDE 0.9 % IJ SOLN
INTRAMUSCULAR | Status: AC
  Filled 2014-08-04: qty 50

## 2014-08-04 MED ORDER — LIDOCAINE HCL (CARDIAC) 20 MG/ML IV SOLN
INTRAVENOUS | Status: AC
  Filled 2014-08-04: qty 5

## 2014-08-04 MED ORDER — DEXTROSE-NACL 5-0.9 % IV SOLN
INTRAVENOUS | Status: DC
  Administered 2014-08-04 – 2014-08-05 (×2): via INTRAVENOUS

## 2014-08-04 MED ORDER — ACETAMINOPHEN 650 MG RE SUPP: 650.0000 mg | Freq: Four times a day (QID) | RECTAL | Status: DC | PRN

## 2014-08-04 MED ORDER — DEXAMETHASONE 4 MG PO TABS
10.0000 mg | ORAL_TABLET | Freq: Every day | ORAL | Status: AC
  Administered 2014-08-05: 10 mg via ORAL
  Filled 2014-08-04: qty 1

## 2014-08-04 MED ORDER — IRBESARTAN 300 MG PO TABS
300.0000 mg | ORAL_TABLET | Freq: Every evening | ORAL | Status: DC
  Administered 2014-08-04 – 2014-08-06 (×3): 300 mg via ORAL
  Filled 2014-08-04 (×4): qty 1

## 2014-08-04 MED ORDER — 0.9 % SODIUM CHLORIDE (POUR BTL) OPTIME
TOPICAL | Status: DC | PRN
  Administered 2014-08-04: 1000 mL

## 2014-08-04 MED ORDER — PROPOFOL 10 MG/ML IV BOLUS
INTRAVENOUS | Status: AC
  Filled 2014-08-04: qty 20

## 2014-08-04 MED ORDER — ATROPINE SULFATE 0.4 MG/ML IJ SOLN
INTRAMUSCULAR | Status: AC
  Filled 2014-08-04: qty 2

## 2014-08-04 MED ORDER — TRANEXAMIC ACID 100 MG/ML IV SOLN
1000.0000 mg | INTRAVENOUS | Status: AC
  Administered 2014-08-04: 1000 mg via INTRAVENOUS
  Filled 2014-08-04: qty 10

## 2014-08-04 MED ORDER — LATANOPROST 0.005 % OP SOLN
1.0000 [drp] | Freq: Every day | OPHTHALMIC | Status: DC
  Administered 2014-08-04 – 2014-08-06 (×3): 1 [drp] via OPHTHALMIC
  Filled 2014-08-04: qty 2.5

## 2014-08-04 MED ORDER — SODIUM CHLORIDE 0.9 % IV SOLN: INTRAVENOUS | Status: DC

## 2014-08-04 MED ORDER — SODIUM CHLORIDE 0.9 % IJ SOLN
INTRAMUSCULAR | Status: DC | PRN
  Administered 2014-08-04: 20 mL

## 2014-08-04 MED ORDER — METOCLOPRAMIDE HCL 10 MG PO TABS
5.0000 mg | ORAL_TABLET | Freq: Three times a day (TID) | ORAL | Status: DC | PRN
  Administered 2014-08-06: 10 mg via ORAL
  Filled 2014-08-04: qty 1

## 2014-08-04 MED ORDER — BUPIVACAINE LIPOSOME 1.3 % IJ SUSP
INTRAMUSCULAR | Status: DC | PRN
  Administered 2014-08-04: 20 mL

## 2014-08-04 MED ORDER — BUPIVACAINE IN DEXTROSE 0.75-8.25 % IT SOLN
INTRATHECAL | Status: DC | PRN
  Administered 2014-08-04: 1.8 mg via INTRATHECAL

## 2014-08-04 MED ORDER — ONDANSETRON HCL 4 MG/2ML IJ SOLN
INTRAMUSCULAR | Status: AC
  Filled 2014-08-04: qty 2

## 2014-08-04 MED ORDER — PROPOFOL INFUSION 10 MG/ML OPTIME
INTRAVENOUS | Status: DC | PRN
  Administered 2014-08-04: 100 ug/kg/min via INTRAVENOUS

## 2014-08-04 MED ORDER — CHLORHEXIDINE GLUCONATE 4 % EX LIQD: 60.0000 mL | Freq: Once | CUTANEOUS | Status: DC

## 2014-08-04 MED ORDER — HYDROMORPHONE HCL 1 MG/ML IJ SOLN
0.2500 mg | INTRAMUSCULAR | Status: DC | PRN
  Administered 2014-08-04 (×2): 0.25 mg via INTRAVENOUS
  Administered 2014-08-04: 0.5 mg via INTRAVENOUS

## 2014-08-04 MED ORDER — ACETAMINOPHEN 500 MG PO TABS
1000.0000 mg | ORAL_TABLET | Freq: Four times a day (QID) | ORAL | Status: AC
  Administered 2014-08-04 – 2014-08-05 (×4): 1000 mg via ORAL
  Filled 2014-08-04 (×4): qty 2

## 2014-08-04 MED ORDER — HYDROMORPHONE HCL 1 MG/ML IJ SOLN
INTRAMUSCULAR | Status: AC
Start: 2014-08-04 — End: 2014-08-05
  Filled 2014-08-04: qty 1

## 2014-08-04 MED ORDER — FLEET ENEMA 7-19 GM/118ML RE ENEM: 1.0000 | ENEMA | Freq: Once | RECTAL | Status: AC | PRN

## 2014-08-04 MED ORDER — DEXAMETHASONE SODIUM PHOSPHATE 10 MG/ML IJ SOLN
INTRAMUSCULAR | Status: AC
  Filled 2014-08-04: qty 1

## 2014-08-04 MED ORDER — ACETAMINOPHEN 325 MG PO TABS: 650.0000 mg | ORAL_TABLET | Freq: Four times a day (QID) | ORAL | Status: DC | PRN

## 2014-08-04 MED ORDER — CEFAZOLIN SODIUM-DEXTROSE 2-3 GM-% IV SOLR
2.0000 g | Freq: Four times a day (QID) | INTRAVENOUS | Status: AC
  Administered 2014-08-04 – 2014-08-05 (×2): 2 g via INTRAVENOUS
  Filled 2014-08-04 (×2): qty 50

## 2014-08-04 MED ORDER — BUPIVACAINE-EPINEPHRINE (PF) 0.25% -1:200000 IJ SOLN
INTRAMUSCULAR | Status: AC
  Filled 2014-08-04: qty 30

## 2014-08-04 MED ORDER — DEXAMETHASONE SODIUM PHOSPHATE 10 MG/ML IJ SOLN
10.0000 mg | Freq: Every day | INTRAMUSCULAR | Status: AC
  Filled 2014-08-04: qty 1

## 2014-08-04 MED ORDER — ONDANSETRON HCL 4 MG/2ML IJ SOLN
INTRAMUSCULAR | Status: DC | PRN
  Administered 2014-08-04: 4 mg via INTRAVENOUS

## 2014-08-04 MED ORDER — METOCLOPRAMIDE HCL 5 MG/ML IJ SOLN
5.0000 mg | Freq: Three times a day (TID) | INTRAMUSCULAR | Status: DC | PRN
  Administered 2014-08-04: 10 mg via INTRAVENOUS
  Filled 2014-08-04: qty 2

## 2014-08-04 MED ORDER — PROMETHAZINE HCL 25 MG/ML IJ SOLN: 6.2500 mg | INTRAMUSCULAR | Status: DC | PRN

## 2014-08-04 MED ORDER — DIPHENHYDRAMINE HCL 12.5 MG/5ML PO ELIX: 12.5000 mg | ORAL_SOLUTION | ORAL | Status: DC | PRN

## 2014-08-04 MED ORDER — PHENOL 1.4 % MT LIQD: 1.0000 | OROMUCOSAL | Status: DC | PRN

## 2014-08-04 MED ORDER — POLYETHYLENE GLYCOL 3350 17 G PO PACK: 17.0000 g | PACK | Freq: Every day | ORAL | Status: DC | PRN

## 2014-08-04 MED ORDER — SACCHAROMYCES BOULARDII 250 MG PO CAPS
250.0000 mg | ORAL_CAPSULE | Freq: Every morning | ORAL | Status: DC
  Administered 2014-08-05 – 2014-08-07 (×3): 250 mg via ORAL
  Filled 2014-08-04 (×3): qty 1

## 2014-08-04 MED ORDER — ONDANSETRON HCL 4 MG/2ML IJ SOLN
4.0000 mg | Freq: Four times a day (QID) | INTRAMUSCULAR | Status: DC | PRN
  Administered 2014-08-05: 4 mg via INTRAVENOUS
  Filled 2014-08-04: qty 2

## 2014-08-04 MED ORDER — LACTATED RINGERS IV SOLN
INTRAVENOUS | Status: DC | PRN
  Administered 2014-08-04: 14:00:00 via INTRAVENOUS

## 2014-08-04 SURGICAL SUPPLY — 41 items
BAG SPEC THK2 15X12 ZIP CLS (MISCELLANEOUS)
BAG ZIPLOCK 12X15 (MISCELLANEOUS) IMPLANT
BLADE EXTENDED COATED 6.5IN (ELECTRODE) ×3 IMPLANT
BLADE SAG 18X100X1.27 (BLADE) ×3 IMPLANT
CAPT HIP PF MOP ×2 IMPLANT
CLOSURE WOUND 1/2 X4 (GAUZE/BANDAGES/DRESSINGS) ×2
COVER PERINEAL POST (MISCELLANEOUS) ×3 IMPLANT
DECANTER SPIKE VIAL GLASS SM (MISCELLANEOUS) ×3 IMPLANT
DRAPE C-ARM 42X120 X-RAY (DRAPES) ×3 IMPLANT
DRAPE STERI IOBAN 125X83 (DRAPES) ×3 IMPLANT
DRAPE U-SHAPE 47X51 STRL (DRAPES) ×9 IMPLANT
DRSG ADAPTIC 3X8 NADH LF (GAUZE/BANDAGES/DRESSINGS) ×3 IMPLANT
DRSG MEPILEX BORDER 4X4 (GAUZE/BANDAGES/DRESSINGS) ×3 IMPLANT
DRSG MEPILEX BORDER 4X8 (GAUZE/BANDAGES/DRESSINGS) ×3 IMPLANT
DRSG TEGADERM 6X8 (GAUZE/BANDAGES/DRESSINGS) ×2 IMPLANT
DURAPREP 26ML APPLICATOR (WOUND CARE) ×3 IMPLANT
ELECT REM PT RETURN 9FT ADLT (ELECTROSURGICAL) ×3
ELECTRODE REM PT RTRN 9FT ADLT (ELECTROSURGICAL) ×1 IMPLANT
EVACUATOR 1/8 PVC DRAIN (DRAIN) ×3 IMPLANT
FACESHIELD WRAPAROUND (MASK) ×12 IMPLANT
GAUZE SPONGE 4X4 12PLY STRL (GAUZE/BANDAGES/DRESSINGS) IMPLANT
GLOVE BIO SURGEON STRL SZ7.5 (GLOVE) ×3 IMPLANT
GLOVE BIO SURGEON STRL SZ8 (GLOVE) ×6 IMPLANT
GLOVE BIOGEL PI IND STRL 8 (GLOVE) ×2 IMPLANT
GLOVE BIOGEL PI INDICATOR 8 (GLOVE) ×4
GOWN STRL REUS W/TWL LRG LVL3 (GOWN DISPOSABLE) ×3 IMPLANT
GOWN STRL REUS W/TWL XL LVL3 (GOWN DISPOSABLE) ×3 IMPLANT
KIT BASIN OR (CUSTOM PROCEDURE TRAY) ×3 IMPLANT
NDL SAFETY ECLIPSE 18X1.5 (NEEDLE) ×2 IMPLANT
NEEDLE HYPO 18GX1.5 SHARP (NEEDLE) ×6
PACK TOTAL JOINT (CUSTOM PROCEDURE TRAY) ×3 IMPLANT
STRIP CLOSURE SKIN 1/2X4 (GAUZE/BANDAGES/DRESSINGS) ×3 IMPLANT
SUT ETHIBOND NAB CT1 #1 30IN (SUTURE) ×3 IMPLANT
SUT MNCRL AB 4-0 PS2 18 (SUTURE) ×3 IMPLANT
SUT VIC AB 2-0 CT1 27 (SUTURE) ×6
SUT VIC AB 2-0 CT1 TAPERPNT 27 (SUTURE) ×2 IMPLANT
SUT VLOC 180 0 24IN GS25 (SUTURE) ×3 IMPLANT
SYR 20CC LL (SYRINGE) ×3 IMPLANT
SYR 50ML LL SCALE MARK (SYRINGE) ×3 IMPLANT
TOWEL OR 17X26 10 PK STRL BLUE (TOWEL DISPOSABLE) ×3 IMPLANT
TRAY FOLEY CATH 14FRSI W/METER (CATHETERS) ×3 IMPLANT

## 2014-08-04 NOTE — Op Note (Signed)
OPERATIVE REPORT  PREOPERATIVE DIAGNOSIS: Osteoarthritis of the Right hip.   POSTOPERATIVE DIAGNOSIS: Osteoarthritis of the Right  hip.   PROCEDURE: Right total hip arthroplasty, anterior approach.   SURGEON: Gaynelle Arabian, MD   ASSISTANT: Otilio Jefferson  ANESTHESIA:  Spinal  ESTIMATED BLOOD LOSS:- 500 ml  DRAINS: Hemovac x1.   COMPLICATIONS: None   CONDITION: PACU - hemodynamically stable.   BRIEF CLINICAL NOTE: Christy Houston is a 80 y.o. female who has advanced end-  stage arthritis of her Right  hip with progressively worsening pain and  dysfunction.The patient has failed nonoperative management and presents for  total hip arthroplasty.   PROCEDURE IN DETAIL: After successful administration of spinal  anesthetic, the traction boots for the [facility] bed were placed on both  feet and the patient was placed onto the [facility] bed, boots placed into the leg  holders. The Right hip was then isolated from the perineum with plastic  drapes and prepped and draped in the usual sterile fashion. ASIS and  greater trochanter were marked and a oblique incision was made, starting  at about 1 cm lateral and 2 cm distal to the ASIS and coursing towards  the anterior cortex of the femur. The skin was cut with a 10 blade  through subcutaneous tissue to the level of the fascia overlying the  tensor fascia lata muscle. The fascia was then incised in line with the  incision at the junction of the anterior third and posterior 2/3rd. The  muscle was teased off the fascia and then the interval between the TFL  and the rectus was developed. The Hohmann retractor was then placed at  the top of the femoral neck over the capsule. The vessels overlying the  capsule were cauterized and the fat on top of the capsule was removed.  A Hohmann retractor was then placed anterior underneath the rectus  femoris to give exposure to the entire anterior capsule. A T-shaped  capsulotomy was  performed. The edges were tagged and the femoral head  was identified.       Osteophytes are removed off the superior acetabulum.  The femoral neck was then cut in situ with an oscillating saw. Traction  was then applied to the left lower extremity utilizing the [facility]  traction. The femoral head was then removed. Retractors were placed  around the acetabulum and then circumferential removal of the labrum was  performed. Osteophytes were also removed. Reaming starts at 45 mm to  medialize and  Increased in 2 mm increments to 49 mm. We reamed in  approximately 40 degrees of abduction, 20 degrees anteversion. A 50 mm  pinnacle acetabular shell was then impacted in anatomic position under  fluoroscopic guidance with excellent purchase. We did not need to place  any additional dome screws. A 32 mm neutral + 4 marathon liner was then  placed into the acetabular shell.       The femoral lift was then placed along the lateral aspect of the femur  just distal to the vastus ridge. The leg was  externally rotated and capsule  was stripped off the inferior aspect of the femoral neck down to the  level of the lesser trochanter, this was done with electrocautery. The femur was lifted after this was performed. The  leg was then placed and extended in adducted position to essentially delivering the femur. We also removed the capsule superiorly and the  piriformis from the piriformis fossa  to gain excellent exposure of the  proximal femur. Rongeur was used to remove some cancellous bone to get  into the lateral portion of the proximal femur for placement of the  initial starter reamer. The starter broaches was placed  the starter broach  and was shown to go down the center of the canal. Broaching  with the  Corail system was then performed starting at size 8, coursing  Up to size 14. A size 14 had excellent torsional and rotational  and axial stability. The trial standard offset neck was then placed  with a  32 + 1 trial head. The hip was then reduced. We confirmed that  the stem was in the canal both on AP and lateral x-rays. It also has excellent sizing. The hip was reduced with outstanding stability through full extension, full external rotation,  and then flexion in adduction internal rotation. AP pelvis was taken  and the leg lengths were measured and found to be exactly equal. Hip  was then dislocated again and the femoral head and neck removed. The  femoral broach was removed. Size 14 Corail stem with a standard offset  neck was then impacted into the femur following native anteversion. Has  excellent purchase in the canal. Excellent torsional and rotational and  axial stability. It is confirmed to be in the canal on AP and lateral  fluoroscopic views. The 32 + 1 metal head was placed and the hip  reduced with outstanding stability. Again AP pelvis was taken and it  confirmed that the leg lengths were equal. The wound was then copiously  irrigated with saline solution and the capsule reattached and repaired  with Ethibond suture.  20 mL of Exparel mixed with 50 mL of saline then additional 20 ml of .25% Bupivicaine injected into the capsule and into the edge of the tensor fascia lata as well as subcutaneous tissue. The fascia overlying the tensor fascia lata was  then closed with a running #1 V-Loc. Subcu was closed with interrupted  2-0 Vicryl and subcuticular running 4-0 Monocryl. Incision was cleaned  and dried. Steri-Strips and a bulky sterile dressing applied. Hemovac  drain was hooked to suction and then he was awakened and transported to  recovery in stable condition.        Please note that a surgical assistant was a medical necessity for this procedure to perform it in a safe and expeditious manner. Assistant was necessary to provide appropriate retraction of vital neurovascular structures and to prevent femoral fracture and allow for anatomic placement of the prosthesis.  Gaynelle Arabian, M.D.

## 2014-08-04 NOTE — H&P (View-Only) (Signed)
Christy Houston   DOB: Nov 13, 1926  Widowed / Language: Cleophus Molt / Race: White  Female Date of Admission: 08/04/2014 Chief complaint:  Right Hip Pain  History of Present Illness The patient is a 80 year old female who comes in for a preoperative history and physical. The patient is scheduled for a right total hip arthroplasty (anterior approach) to be performed by Dr. Dione Plover. [name], MD at [facility] on 08/04/2014.  The patient is a 79 year old female who presents for follow up of their hip. The patient is being followed for their right hip pain and osteoarthritis. They are several week(s) out from last visit. Symptoms reported today include: pain. The patient feels that they are not doing better. The following medication has been used for pain control: Tylenol (prn). The patient presents today following IA injection with Dr.Ramos.  Unfortunately the injection did not provide any long lasting benefit. She is at a stage now where her hip is getting progressively worse. She is ready to go ahead and get it fixed.  They have been treated conservatively in the past for the above stated problem and despite conservative measures, they continue to have progressive pain and severe functional limitations and dysfunction. They have failed non-operative management including home exercise, medications, and injections. It is felt that they would benefit from undergoing total joint replacement. Risks and benefits of the procedure have been discussed with the patient and they elect to proceed with surgery. There are no active contraindications to surgery such as ongoing infection or rapidly progressive neurological disease.   Problem List/Past Medical  Insufficiency fracture of femur (733.14  M84.453A)  Aftercare following joint replacement surgery (V54.81  Z47.1)  S/P Left total hip arthroplasty (V43.64) (V43.64  Z96.649)  Primary osteoarthritis of both knees (715.16  M17.0)   Hypercholesterolemia  High blood pressure  Glaucoma  Cataract  Measles  Mumps  Menopause   Allergies  No Known Drug Allergies  Family History Hypertension Brother. brother   Social History Alcohol use current drinker; drinks wine; less than 5 per week  Tobacco use Former smoker. former smoker; smoke(d) 3/4 pack(s) per day  Pain Contract no  Tobacco / smoke exposure no  Post-Surgical Plans She wants to go into the skilled unit at [facility].  [place]  Marital status Widowed.  Living situation Lives alone.   Medication History Astepro (0.15% Solution, Nasal) Active. (prn)  Restora (Oral) Active.  Grape Seed Extract (50MG  Tablet, Oral) Active.  Calcium (500MG  Tablet, Oral) Active.  Vitamin D (1000UNIT Tablet, Oral) Active.  Glucosamine HCl (Oral) Specific dose unknown - Active. (witih Condroitin)  Aspirin EC (81MG  Tablet DR, Oral) Active.  Travatan Z (0.004% Solution, Ophthalmic) Active.  Multiple Vitamins (Oral) Specific dose unknown - Active.  Lipitor (10MG  Tablet, Oral) Active.  Diovan (320MG  Tablet, Oral) Active.   Past Surgical History Hysterectomy Date: 10/1968. partial (non-cancerous)  Kidney Removal Date: 04/2008. right  Total Hip Replacement Date: 04/01/2006. left    Review of Systems General Not Present- Chills, Fatigue, Fever, Memory Loss, Night Sweats, Weight Gain and Weight Loss.  Skin Not Present- Eczema, Hives, Itching, Lesions and Rash.  HEENT Not Present- Dentures, Double Vision, Headache, Hearing Loss, Tinnitus and Visual Loss.  Respiratory Not Present- Allergies, Chronic Cough, Coughing up blood, Shortness of breath at rest and Shortness of breath with exertion. Cardiovascular Not Present- Chest Pain, Difficulty Breathing Lying Down, Murmur, Palpitations, Racing/skipping heartbeats and Swelling.  Gastrointestinal Not Present- Abdominal Pain, Bloody Stool, Constipation,  Diarrhea, Difficulty Swallowing, Heartburn, Jaundice, Loss  of appetitie, Nausea and Vomiting.  Female Genitourinary Present- Urinating at Night. Not Present- Blood in Urine, Discharge, Flank Pain, Incontinence, Painful Urination, Urgency, Urinary frequency, Urinary Retention and Weak urinary stream.  Musculoskeletal Present- Joint Pain. Not Present- Back Pain, Joint Swelling, Morning Stiffness, Muscle Pain, Muscle Weakness and Spasms.  Neurological Not Present- Blackout spells, Difficulty with balance, Dizziness, Paralysis, Tremor and Weakness.  Psychiatric Not Present- Insomnia.  Vitals Weight: 140 lb Height: 66 in  Weight was reported by patient.  Height was reported by patient.  Body Surface Area: 1.72 m Body Mass Index: 22.6 kg/m  BP: 124/76 (Sitting, Left Arm, Standard)   Physical Exam General  Mental Status - Alert, cooperative and good historian.  General Appearance - pleasant, Not in acute distress.  Orientation - Oriented X3.  Build & Nutrition - Well nourished and Well developed.  Head and Neck  Head - normocephalic, atraumatic .  Neck  Global Assessment - supple, no bruit auscultated on the right, no bruit auscultated on the left.  Note: small partial dental plate  Eye  Pupil - Bilateral - Regular and Round.  Motion - Bilateral - EOMI.  Chest and Lung Exam  Auscultation  Breath sounds - clear at anterior chest wall and clear at posterior chest wall. Adventitious sounds - No Adventitious sounds.  Cardiovascular  Auscultation  Rhythm - Regular rate and rhythm. Heart Sounds - S1 WNL and S2 WNL. Murmurs & Other Heart Sounds - Auscultation of the heart reveals - No Murmurs.  Abdomen  Palpation/Percussion  Tenderness - Abdomen is non-tender to palpation. Rigidity (guarding) - Abdomen is soft.  Auscultation  Auscultation of the abdomen reveals - Bowel sounds normal.  Female Genitourinary  Note: Not done, not pertinent to present illness  Musculoskeletal  Note: She is alert and oriented. No apparent distress. The right hip  flexed to 90. No internal rotation. About 20-30 external rotation and 30 abduction.  RADIOGRAPHS:  X-rays once again and she has bone on bone arthritis throughout the hip with subchondral cystic formation.   Assessment & Plan  Osteoarthritis of right hip, unspecified osteoarthritis type (715.95  M16.11)  Note: Plan is for a Right Total Hip Replacement - Anterior Approach by Dr. Wynelle Link.  Plan is to go to the skilled unit at [facility].  PCP - Dr. Delena Bali  The patient does not have any contraindications and will receive TXA (tranexamic acid) prior to surgery.  Signed electronically by Joelene Millin, III PA-C

## 2014-08-04 NOTE — Transfer of Care (Signed)
Immediate Anesthesia Transfer of Care Note  Patient: Christy Houston  Procedure(s) Performed: Procedure(s): RIGHT TOTAL HIP ARTHROPLASTY ANTERIOR APPROACH (Right)  Patient Location: PACU  Anesthesia Type:MAC and Spinal  Level of Consciousness: awake, alert , oriented and patient cooperative  Airway & Oxygen Therapy: Patient Spontanous Breathing and Patient connected to face mask oxygen  Post-op Assessment: Report given to PACU RN and Post -op Vital signs reviewed and stable  Post vital signs: Reviewed and stable  Complications: No apparent anesthesia complications

## 2014-08-04 NOTE — Plan of Care (Signed)
Problem: Consults Goal: Diagnosis- Total Joint Replacement Primary Total Hip     

## 2014-08-04 NOTE — Anesthesia Preprocedure Evaluation (Addendum)
Anesthesia Evaluation  Patient identified by MRN, date of birth, ID band Patient awake    Reviewed: Allergy & Precautions, H&P , NPO status , Patient's Chart, lab work & pertinent test results  Airway Mallampati: II TM Distance: >3 FB Neck ROM: Full    Dental no notable dental hx.    Pulmonary neg pulmonary ROS, former smoker,  breath sounds clear to auscultation  Pulmonary exam normal       Cardiovascular hypertension, Pt. on medications Rhythm:Regular Rate:Normal     Neuro/Psych negative neurological ROS  negative psych ROS   GI/Hepatic negative GI ROS, Neg liver ROS,   Endo/Other  negative endocrine ROS  Renal/GU negative Renal ROS  negative genitourinary   Musculoskeletal negative musculoskeletal ROS (+)   Abdominal   Peds negative pediatric ROS (+)  Hematology negative hematology ROS (+)   Anesthesia Other Findings   Reproductive/Obstetrics negative OB ROS                           Anesthesia Physical Anesthesia Plan  ASA: II  Anesthesia Plan: Spinal   Post-op Pain Management:    Induction: Intravenous  Airway Management Planned: Simple Face Mask  Additional Equipment:   Intra-op Plan:   Post-operative Plan:   Informed Consent: I have reviewed the patients History and Physical, chart, labs and discussed the procedure including the risks, benefits and alternatives for the proposed anesthesia with the patient or authorized representative who has indicated his/her understanding and acceptance.   Dental advisory given  Plan Discussed with: CRNA and Surgeon  Anesthesia Plan Comments:         Anesthesia Quick Evaluation  

## 2014-08-04 NOTE — Interval H&P Note (Signed)
History and Physical Interval Note:  08/04/2014 3:11 PM  Christy Houston  has presented today for surgery, with the diagnosis of OA RIGHT HIP   The various methods of treatment have been discussed with the patient and family. After consideration of risks, benefits and other options for treatment, the patient has consented to  Procedure(s): RIGHT TOTAL HIP ARTHROPLASTY ANTERIOR APPROACH (Right) as a surgical intervention .  The patient's history has been reviewed, patient examined, no change in status, stable for surgery.  I have reviewed the patient's chart and labs.  Questions were answered to the patient's satisfaction.     [name]

## 2014-08-04 NOTE — Anesthesia Postprocedure Evaluation (Signed)
  Anesthesia Post-op Note  Patient: Christy Houston  Procedure(s) Performed: Procedure(s) (LRB): RIGHT TOTAL HIP ARTHROPLASTY ANTERIOR APPROACH (Right)  Patient Location: PACU  Anesthesia Type: Spinal  Level of Consciousness: awake and alert   Airway and Oxygen Therapy: Patient Spontanous Breathing  Post-op Pain: mild  Post-op Assessment: Post-op Vital signs reviewed, Patient's Cardiovascular Status Stable, Respiratory Function Stable, Patent Airway and No signs of Nausea or vomiting  Last Vitals:  Filed Vitals:   08/04/14 1938  BP:   Pulse:   Temp:   Resp: 17    Post-op Vital Signs: stable   Complications: No apparent anesthesia complications

## 2014-08-04 NOTE — Anesthesia Procedure Notes (Signed)
Spinal  Patient location during procedure: OR Start time: 08/04/2014 3:55 PM End time: 08/04/2014 3:59 PM Staffing CRNA/Resident: Alistair Senft G Preanesthetic Checklist Completed: patient identified, site marked, surgical consent, pre-op evaluation, timeout performed, IV checked, risks and benefits discussed and monitors and equipment checked Spinal Block Patient position: sitting Prep: Betasept and site prepped and draped Patient monitoring: continuous pulse ox and blood pressure Approach: midline Location: L3-4 Injection technique: single-shot Needle Needle type: Spinocan  Needle gauge: 22 G Needle length: 9 cm Needle insertion depth: 4 cm Assessment Sensory level: T6

## 2014-08-05 ENCOUNTER — Encounter (HOSPITAL_COMMUNITY): Payer: Self-pay | Admitting: Orthopedic Surgery

## 2014-08-05 LAB — BASIC METABOLIC PANEL
Anion gap: 8 (ref 5–15)
BUN: 17 mg/dL (ref 6–23)
CALCIUM: 8.7 mg/dL (ref 8.4–10.5)
CO2: 25 meq/L (ref 19–32)
CREATININE: 0.71 mg/dL (ref 0.50–1.10)
Chloride: 103 mEq/L (ref 96–112)
GFR calc Af Amer: 88 mL/min — ABNORMAL LOW (ref >90)
GFR calc non Af Amer: 76 mL/min — ABNORMAL LOW (ref >90)
Glucose, Bld: 166 mg/dL — ABNORMAL HIGH (ref 70–99)
Potassium: 4.2 mEq/L (ref 3.7–5.3)
Sodium: 136 mEq/L — ABNORMAL LOW (ref 137–147)

## 2014-08-05 LAB — CBC
HEMATOCRIT: 30.6 % — AB (ref 36.0–46.0)
Hemoglobin: 10.1 g/dL — ABNORMAL LOW (ref 12.0–15.0)
MCH: 28.9 pg (ref 26.0–34.0)
MCHC: 33 g/dL (ref 30.0–36.0)
MCV: 87.7 fL (ref 78.0–100.0)
PLATELETS: 169 10*3/uL (ref 150–400)
RBC: 3.49 MIL/uL — AB (ref 3.87–5.11)
RDW: 12.8 % (ref 11.5–15.5)
WBC: 7 10*3/uL (ref 4.0–10.5)

## 2014-08-05 MED ORDER — ALUM & MAG HYDROXIDE-SIMETH 200-200-20 MG/5ML PO SUSP
ORAL | Status: AC
  Administered 2014-08-05: 30 mL
  Filled 2014-08-05: qty 30

## 2014-08-05 MED ORDER — ALUM & MAG HYDROXIDE-SIMETH 200-200-20 MG/5ML PO SUSP: 30.0000 mL | ORAL | Status: DC | PRN

## 2014-08-05 NOTE — Evaluation (Signed)
Occupational Therapy Evaluation Patient Details Name: NAREH MATZKE MRN: [id number] DOB: [birth year] Today's Date: 08/05/2014    History of Present Illness Pt is s/p R direct anterior THA. Pt with PMH glaucoma and HTN.   Clinical Impression   Pt requires increased time to transfer into bathroom with walker and transition on and off 3in1. She needs multimodal cues for safety with hand placement and walker use. She is fearful of falling and requests reassurance that therapist "wont let go." She will benefit from continued OT to progress ADL independence for next venue.    Follow Up Recommendations  SNF;Supervision/Assistance - 24 hour    Equipment Recommendations  3 in 1 bedside comode    Recommendations for Other Services       Precautions / Restrictions Precautions Precautions: Fall Precaution Comments: direct anterior approach Restrictions Weight Bearing Restrictions: No Other Position/Activity Restrictions: WBAT      Mobility Bed Mobility               General bed mobility comments: in chair when OT arrived.  Transfers Overall transfer level: Needs assistance Equipment used: Rolling walker (2 wheeled) Transfers: Sit to/from Stand Sit to Stand: Min assist         General transfer comment: verbal cues for hand placement and LE management.    Balance                                            ADL Overall ADL's : Needs assistance/impaired Eating/Feeding: Independent;Sitting   Grooming: Wash/dry hands;Set up;Sitting   Upper Body Bathing: Set up;Sitting   Lower Body Bathing: Moderate assistance;Sit to/from stand   Upper Body Dressing : Set up;Sitting   Lower Body Dressing: Maximal assistance;Sit to/from stand   Toilet Transfer: Minimal assistance;Ambulation;BSC;RW   Toileting- Clothing Manipulation and Hygiene: Moderate assistance;Sit to/from stand         General ADL Comments: Pt requires increased time and  multimodal cues for hand placement/LE management. Pt states she is fearful of falling and repeatedly states, "please dont let go of me." Introduced AE and pt did practice with reacher and sock aid. She states she will keep AE in mind in case she needs from rehab to d/c home. Pt limited by intermittent pain of 10/10 and down to 8/10 when still.      Vision                     Perception     Praxis      Pertinent Vitals/Pain Pain Assessment: 0-10 Pain Score: 4  Pain Location: R hip Pain Descriptors / Indicators: Aching Pain Intervention(s): Monitored during session;Premedicated before session;Repositioned;Ice applied     Hand Dominance     Extremity/Trunk Assessment Upper Extremity Assessment Upper Extremity Assessment: Overall WFL for tasks assessed           Communication Communication Communication: No difficulties   Cognition Arousal/Alertness: Awake/alert Behavior During Therapy: WFL for tasks assessed/performed Overall Cognitive Status: Within Functional Limits for tasks assessed                     General Comments       Exercises       Shoulder Instructions      Home Living Family/patient expects to be discharged to:: Skilled nursing facility Living Arrangements: Alone   Type of Home: Independent living facility  Additional Comments: from [facility] and plans to d/c to rehab section      Prior Functioning/Environment Level of Independence: Independent             OT Diagnosis: Generalized weakness;Acute pain   OT Problem List: Decreased strength;Decreased knowledge of use of DME or AE;Pain   OT Treatment/Interventions: Self-care/ADL training;Patient/family education;Therapeutic activities;DME and/or AE instruction    OT Goals(Current goals can be found in the care plan section) Acute Rehab OT Goals Patient Stated Goal: to return to independence.  OT Goal Formulation: With patient Time  For Goal Achievement: 08/12/14 Potential to Achieve Goals: Good  OT Frequency: Min 2X/week   Barriers to D/C:            Co-evaluation              End of Session Equipment Utilized During Treatment: Gait belt;Rolling walker  Activity Tolerance: Patient limited by pain Patient left: in chair;with call bell/phone within reach   Time: 6629-4765 OT Time Calculation (min): 43 min Charges:  OT General Charges $OT Visit: 1 Procedure OT Evaluation $Initial OT Evaluation Tier I: 1 Procedure OT Treatments $Self Care/Home Management : 8-22 mins $Therapeutic Activity: 23-37 mins G-Codes:    Jules Schick [phone] 08/05/2014, 12:15 PM

## 2014-08-05 NOTE — Progress Notes (Signed)
Clinical Social Work Department BRIEF PSYCHOSOCIAL ASSESSMENT 08/05/2014  Patient:  Christy Houston, Christy Houston     Account Number:  [id number]     Admit date:  08/04/2014  Clinical Social Worker:  Earlie Server  Date/Time:  08/05/2014 11:30 AM  Referred by:  Physician  Date Referred:  08/05/2014 Referred for  SNF Placement   Other Referral:   Interview type:  Patient Other interview type:    PSYCHOSOCIAL DATA Living Status:  FACILITY Admitted from facility:  [facility] Level of care:  Independent Living Primary support name:  Judeth Porch Primary support relationship to patient:  FAMILY Degree of support available:   Adequate    CURRENT CONCERNS Current Concerns  Post-Acute Placement   Other Concerns:    SOCIAL WORK ASSESSMENT / PLAN CSW received referral to assist with DC planning. CSW reviewed chart and met with patient at bedside. CSW introduced myself and explained role.    Patient reports she was living at [facility] and this was a planned surgery. Patient reports she had already spoken to facility about returning to SNF for rehab at DC. Patient reports family is aware and she is waiting to be medically DC. CSW explained DC process and CSW role. Patient reports she will update family when they come to visit.    CSW spoke with Altha Harm at [facility] who is aware of skilled needs and facility can accept at DC. CSW faxed FL2 and will continue to follow.   Assessment/plan status:  Psychosocial Support/Ongoing Assessment of Needs Other assessment/ plan:   Information/referral to community resources:   SNF information    PATIENT'S/FAMILY'S RESPONSE TO PLAN OF CARE: Patient alert and oriented and engaged in assessment. Patient thanked CSW for visit and reports she is happy that she will be familiar with where she is getting rehab. Patient reports she feels well prepared and glad she prepared everything ahead of time.       Sindy Messing,  LCSW (Coverage for [employer])

## 2014-08-05 NOTE — Progress Notes (Signed)
   Subjective: 1 Day Post-Op Procedure(s) (LRB): RIGHT TOTAL HIP ARTHROPLASTY ANTERIOR APPROACH (Right) Patient reports pain as mild.   Patient seen in rounds with Dr. Wynelle Link. Patient is well, but has had some minor complaints of pain in the hip, requiring pain medications We will start therapy today.  Plan is to go to [facility] in th next couple of days after hospital stay.  Objective: Vital signs in last 24 hours: Temp:  [97.3 F (36.3 C)-98.5 F (36.9 C)] 97.5 F (36.4 C) (09/17 0515) Pulse Rate:  [52-77] 52 (09/17 0515) Resp:  [13-20] 16 (09/17 0754) BP: (136-198)/(47-169) 136/47 mmHg (09/17 0515) SpO2:  [100 %] 100 % (09/17 0515) Weight:  [65.772 kg (145 lb)] 65.772 kg (145 lb) (09/16 1215)  Intake/Output from previous day:  Intake/Output Summary (Last 24 hours) at 08/05/14 0823 Last data filed at 08/05/14 0700  Gross per 24 hour  Intake 3263.75 ml  Output   1725 ml  Net 1538.75 ml    Labs:  Recent Labs  08/05/14 0535  HGB 10.1*    Recent Labs  08/05/14 0535  WBC 7.0  RBC 3.49*  HCT 30.6*  PLT 169    Recent Labs  08/05/14 0535  NA 136*  K 4.2  CL 103  CO2 25  BUN 17  CREATININE 0.71  GLUCOSE 166*  CALCIUM 8.7   No results found for this basename: LABPT, INR,  in the last 72 hours  EXAM General - Patient is Alert, Appropriate and Oriented Extremity - Neurovascular intact Sensation intact distally Dressing - dressing C/D/I Motor Function - intact, moving foot and toes well on exam.  Hemovac pulled without difficulty.  Past Medical History  Diagnosis Date  . Hypertension   . Hypercholesteremia   . Glaucoma     bilateral  . Arthritis     osteoarthritis hips  . Fractures, multiple     2015- Rt. femur, hx, left elbow, rt. shoulder  . Cancer     skin cancer "basal cell", Right  kidney cancer    Assessment/Plan: 1 Day Post-Op Procedure(s) (LRB): RIGHT TOTAL HIP ARTHROPLASTY ANTERIOR APPROACH (Right) Principal Problem:   OA  (osteoarthritis) of hip  Estimated body mass index is 23.41 kg/(m^2) as calculated from the following:   Height as of this encounter: 5\' 6"  (1.676 m).   Weight as of this encounter: 65.772 kg (145 lb). Advance diet Up with therapy Discharge to SNF - [facility]  DVT Prophylaxis - Xarelto Weight Bearing As Tolerated right Leg Hemovac Pulled Begin Therapy  [name], PA-C Orthopaedic Surgery 08/05/2014, 8:23 AM

## 2014-08-05 NOTE — Evaluation (Signed)
Physical Therapy Evaluation Patient Details Name: Christy Houston MRN: [id number] DOB: 1926/03/18 Today's Date: 08/05/2014   History of Present Illness  Pt is s/p R direct anterior THA. Pt with PMH glaucoma and HTN.  Clinical Impression  Pt is s/p R THA resulting in the deficits listed below (see PT Problem List).  Pt will benefit from skilled PT to increase their independence and safety with mobility to allow discharge to the venue listed below.   Pt a little nervous about OOB for first time however performing well and gaining confidence.  Pt plans to d/c to skilled section of [facility] for continuing rehab prior to return to independent living.     Follow Up Recommendations SNF    Equipment Recommendations  None recommended by PT    Recommendations for Other Services       Precautions / Restrictions Precautions Precautions: Fall Precaution Comments: direct anterior approach Restrictions Weight Bearing Restrictions: No Other Position/Activity Restrictions: WBAT      Mobility  Bed Mobility Overal bed mobility: Needs Assistance Bed Mobility: Supine to Sit     Supine to sit: Min assist     General bed mobility comments: required assist for R LE  Transfers Overall transfer level: Needs assistance Equipment used: Rolling walker (2 wheeled) Transfers: Sit to/from Stand Sit to Stand: Min assist         General transfer comment: verbal cues for UE and LE positioning, increased time and cues due to pt feeling nervous about first time OOB  Ambulation/Gait Ambulation/Gait assistance: Min assist Ambulation Distance (Feet): 40 Feet Assistive device: Rolling walker (2 wheeled) Gait Pattern/deviations: Step-to pattern;Antalgic;Decreased stance time - right Gait velocity: decr   General Gait Details: verbal cues for sequence, RW positioning, posture, step length  Stairs            Wheelchair Mobility    Modified Rankin (Stroke Patients Only)        Balance                                             Pertinent Vitals/Pain Pain Assessment: 0-10 Pain Score: 4  Pain Location: R hip Pain Descriptors / Indicators: Aching Pain Intervention(s): Monitored during session;Premedicated before session;Repositioned;Ice applied    Home Living Family/patient expects to be discharged to:: Skilled nursing facility Living Arrangements: Alone   Type of Home: Independent living facility           Additional Comments: from [facility] and plans to d/c to rehab section    Prior Function Level of Independence: Independent               Hand Dominance        Extremity/Trunk Assessment   Upper Extremity Assessment: Overall WFL for tasks assessed           Lower Extremity Assessment: RLE deficits/detail RLE Deficits / Details: decreased functional hip strength observed       Communication   Communication: No difficulties  Cognition Arousal/Alertness: Awake/alert Behavior During Therapy: WFL for tasks assessed/performed Overall Cognitive Status: Within Functional Limits for tasks assessed                      General Comments      Exercises Total Joint Exercises Ankle Circles/Pumps: AROM;Both;15 reps Quad Sets: AROM;Both;15 reps Short Arc Quad: AROM;Right;10 reps Heel Slides: AAROM;Right;10 reps Hip ABduction/ADduction:  AAROM;Right;10 reps      Assessment/Plan    PT Assessment Patient needs continued PT services  PT Diagnosis Difficulty walking;Acute pain   PT Problem List Decreased strength;Decreased mobility;Decreased knowledge of use of DME;Pain  PT Treatment Interventions Gait training;DME instruction;Functional mobility training;Patient/family education;Therapeutic activities;Therapeutic exercise   PT Goals (Current goals can be found in the Care Plan section) Acute Rehab PT Goals Patient Stated Goal: [facility] for rehab then back to ILF PT Goal Formulation: With  patient Time For Goal Achievement: 08/12/14 Potential to Achieve Goals: Good    Frequency 7X/week   Barriers to discharge        Co-evaluation               End of Session Equipment Utilized During Treatment: Gait belt Activity Tolerance: Patient tolerated treatment well Patient left: in chair;with call bell/phone within reach           Time: 0929-0958 PT Time Calculation (min): 29 min   Charges:   PT Evaluation $Initial PT Evaluation Tier I: 1 Procedure PT Treatments $Gait Training: 8-22 mins $Therapeutic Exercise: 8-22 mins   PT G Codes:          Brenin Heidelberger,[name] 08/05/2014, 12:32 PM [name], PT, DPT 08/05/2014 Pager: [phone]

## 2014-08-05 NOTE — Progress Notes (Signed)
Physical Therapy Treatment Note   08/05/14 1600  PT Visit Information  Last PT Received On 08/05/14  Assistance Needed +1  History of Present Illness Pt is s/p R direct anterior THA. Pt with PMH glaucoma and HTN.  PT Time Calculation  PT Start Time 1557  PT Stop Time 1613  PT Time Calculation (min) 16 min  Subjective Data  Subjective Pt assisted off BSC and ambulated short distance in hallway then assisted back to bed.  Precautions  Precautions Fall  Precaution Comments direct anterior approach  Restrictions  Other Position/Activity Restrictions WBAT  Pain Assessment  Pain Assessment 0-10  Pain Score 2  Pain Location R hip  Pain Descriptors / Indicators Aching;Sore  Pain Intervention(s) Monitored during session;Limited activity within patient's tolerance;Premedicated before session;Repositioned  Cognition  Arousal/Alertness Awake/alert  Behavior During Therapy WFL for tasks assessed/performed  Overall Cognitive Status Within Functional Limits for tasks assessed  Bed Mobility  Overal bed mobility Needs Assistance  Bed Mobility Sit to Supine  Sit to supine Mod assist  General bed mobility comments required assist for bil LEs  Transfers  Overall transfer level Needs assistance  Equipment used Rolling walker (2 wheeled)  Transfers Sit to/from Stand  Sit to Stand Min assist  General transfer comment verbal cues for UE and LE positioning, increased time to get BOS and bring hand to RW (fear of falling)  Ambulation/Gait  Ambulation/Gait assistance Min assist  Ambulation Distance (Feet) 25 Feet  Assistive device Rolling walker (2 wheeled)  Gait Pattern/deviations Step-to pattern;Antalgic  Gait velocity decr  General Gait Details verbal cues for sequence, RW positioning, posture, step length  PT - End of Session  Activity Tolerance Patient tolerated treatment well  Patient left in bed;with call bell/phone within reach  PT - Assessment/Plan  PT Plan Current plan remains  appropriate  PT Frequency 7X/week  Follow Up Recommendations SNF  PT equipment None recommended by PT  PT Goal Progression  Progress towards PT goals Progressing toward goals  PT General Charges  $$ ACUTE PT VISIT 1 Procedure  PT Treatments  $Gait Training 8-22 mins   [name], PT, DPT 08/05/2014 Pager: [phone]

## 2014-08-05 NOTE — Discharge Instructions (Addendum)
Dr. Gaynelle Arabian Total Joint Specialist [facility] [address] [place], [place] [address] [phone]    ANTERIOR APPROACH TOTAL HIP REPLACEMENT POSTOPERATIVE DIRECTIONS   Hip Rehabilitation, Guidelines Following Surgery  The results of a hip operation are greatly improved after range of motion and muscle strengthening exercises. Follow all safety measures which are given to protect your hip. If any of these exercises cause increased pain or swelling in your joint, decrease the amount until you are comfortable again. Then slowly increase the exercises. Call your caregiver if you have problems or questions.  HOME CARE INSTRUCTIONS  Most of the following instructions are designed to prevent the dislocation of your new hip.  Remove items at home which could result in a fall. This includes throw rugs or furniture in walking pathways.  Continue medications as instructed at time of discharge.  You may have some home medications which will be placed on hold until you complete the course of blood thinner medication.  You may start showering once you are discharged home but do not submerge the incision under water. Just pat the incision dry and apply a dry gauze dressing on daily. Do not put on socks or shoes without following the instructions of your caregivers.  Sit on high chairs which makes it easier to stand.  Sit on chairs with arms. Use the chair arms to help push yourself up when arising.  Keep your leg on the side of the operation out in front of you when standing up.  Arrange for the use of a toilet seat elevator so you are not sitting low.    Walk with walker as instructed.  You may resume a sexual relationship in one month or when given the OK by your caregiver.  Use walker as long as suggested by your caregivers.  You may put full weight on your legs and walk as much as is comfortable. Avoid periods of inactivity such as sitting longer than an hour  when not asleep. This helps prevent blood clots.  You may return to work once you are cleared by Engineer, production.  Do not drive a car for 6 weeks or until released by your surgeon.  Do not drive while taking narcotics.  Wear elastic stockings for three weeks following surgery during the day but you may remove then at night.  Make sure you keep all of your appointments after your operation with all of your doctors and caregivers. You should call the office at the above phone number and make an appointment for approximately two weeks after the date of your surgery. Change the dressing daily and reapply a dry dressing each time. Please pick up a stool softener and laxative for home use as long as you are requiring pain medications.  Continue to use ice on the hip for pain and swelling from surgery. You may notice swelling that will progress down to the foot and ankle.  This is normal after surgery.  Elevate the leg when you are not up walking on it.   It is important for you to complete the blood thinner medication as prescribed by your doctor.  Continue to use the breathing machine which will help keep your temperature down.  It is common for your temperature to cycle up and down following surgery, especially at night when you are not up moving around and exerting yourself.  The breathing machine keeps your lungs expanded and your temperature down.  RANGE OF MOTION AND STRENGTHENING EXERCISES  These exercises are designed to help you keep full movement of your hip joint. Follow your caregiver's or physical therapist's instructions. Perform all exercises about fifteen times, three times per day or as directed. Exercise both hips, even if you have had only one joint replacement. These exercises can be done on a training (exercise) mat, on the floor, on a table or on a bed. Use whatever works the best and is most comfortable for you. Use music or television while you are exercising so that the exercises are a  pleasant break in your day. This will make your life better with the exercises acting as a break in routine you can look forward to.  Lying on your back, slowly slide your foot toward your buttocks, raising your knee up off the floor. Then slowly slide your foot back down until your leg is straight again.  Lying on your back spread your legs as far apart as you can without causing discomfort.  Lying on your side, raise your upper leg and foot straight up from the floor as far as is comfortable. Slowly lower the leg and repeat.  Lying on your back, tighten up the muscle in the front of your thigh (quadriceps muscles). You can do this by keeping your leg straight and trying to raise your heel off the floor. This helps strengthen the largest muscle supporting your knee.  Lying on your back, tighten up the muscles of your buttocks both with the legs straight and with the knee bent at a comfortable angle while keeping your heel on the floor.   SKILLED REHAB INSTRUCTIONS: If the patient is transferred to a skilled rehab facility following release from the hospital, a list of the current medications will be sent to the facility for the patient to continue.  When discharged from the skilled rehab facility, please have the facility set up the patient's [place] prior to being released. Also, the skilled facility will be responsible for providing the patient with their medications at time of release from the facility to include their pain medication, the muscle relaxants, and their blood thinner medication. If the patient is still at the rehab facility at time of the two week follow up appointment, the skilled rehab facility will also need to assist the patient in arranging follow up appointment in our office and any transportation needs.  MAKE SURE YOU:  Understand these instructions.  Will watch your condition.  Will get help right away if you are not doing well or get worse.  Pick up  stool softner and laxative for home. Do not submerge incision under water. May shower. Continue to use ice for pain and swelling from surgery. Total Hip Protocol.  Take Xarelto for two and a half more weeks, then discontinue Xarelto. Once the patient has completed the Xarelto, they may resume the 81 mg Aspirin.  When discharged from the skilled rehab facility, please have the facility set up the patient's [place] prior to being released.  Also provide the patient with their medications at time of release from the facility to include their pain medication, the muscle relaxants, and their blood thinner medication.  If the patient is still at the rehab facility at time of follow up appointment, please also assist the patient in arranging follow up appointment in our office and any transportation needs.   Information on my medicine - XARELTO (Rivaroxaban)  This medication education was reviewed with me or my healthcare representative as part  of my discharge preparation.  The pharmacist that spoke with me during my hospital stay was:  Julio Sicks, [place]  Why was Xarelto prescribed for you? Xarelto was prescribed for you to reduce the risk of blood clots forming after orthopedic surgery. The medical term for these abnormal blood clots is venous thromboembolism (VTE).  What do you need to know about xarelto ? Take your Xarelto ONCE DAILY at the same time every day. You may take it either with or without food.  If you have difficulty swallowing the tablet whole, you may crush it and mix in applesauce just prior to taking your dose.  Take Xarelto exactly as prescribed by your doctor and DO NOT stop taking Xarelto without talking to the doctor who prescribed the medication.  Stopping without other VTE prevention medication to take the place of Xarelto may increase your risk of developing a clot.  After discharge, you should have regular check-up appointments with your  healthcare provider that is prescribing your Xarelto.    What do you do if you miss a dose? If you miss a dose, take it as soon as you remember on the same day then continue your regularly scheduled once daily regimen the next day. Do not take two doses of Xarelto on the same day.   Important Safety Information A possible side effect of Xarelto is bleeding. You should call your healthcare provider right away if you experience any of the following:   Bleeding from an injury or your nose that does not stop.   Unusual colored urine (red or dark brown) or unusual colored stools (red or black).   Unusual bruising for unknown reasons.   A serious fall or if you hit your head (even if there is no bleeding).  Some medicines may interact with Xarelto and might increase your risk of bleeding while on Xarelto. To help avoid this, consult your healthcare provider or pharmacist prior to using any new prescription or non-prescription medications, including herbals, vitamins, non-steroidal anti-inflammatory drugs (NSAIDs) and supplements.  This website has more information on Xarelto: [email or web address]

## 2014-08-05 NOTE — Care Management Note (Signed)
    Page 1 of 1   08/05/2014     2:07:56 PM CARE MANAGEMENT NOTE 08/05/2014  Patient:  Christy Houston, Christy Houston   Account Number:  [id number]  Date Initiated:  08/05/2014  Documentation initiated by:  [facility]  Subjective/Objective Assessment:   adm: RIGHT TOTAL HIP ARTHROPLASTY ANTERIOR APPROACH (Right)     Action/Plan:   SNF   Anticipated DC Date:  08/06/2014   Anticipated DC Plan:        [place]  CM consult      Choice offered to / List presented to:             Status of service:   Medicare Important Message given?   (If response is "NO", the following Medicare IM given date fields will be blank) Date Medicare IM given:   Medicare IM given by:   Date Additional Medicare IM given:   Additional Medicare IM given by:    Discharge Disposition:  [place]  Per UR Regulation:    If discussed at Long Length of Stay Meetings, dates discussed:    Comments:  08/05/14 14:00 CM notes pt going to SNF; CSW arranging.  No other CM needs were communicated.  Mariane Masters, BSN, CM [phone].

## 2014-08-05 NOTE — Progress Notes (Signed)
Clinical Social Work Department CLINICAL SOCIAL WORK PLACEMENT NOTE 08/05/2014  Patient:  LINEA, CALLES  Account Number:  [id number] Admit date:  08/04/2014  Clinical Social Worker:  Sindy Messing, LCSW  Date/time:  08/05/2014 11:30 AM  Clinical Social Work is seeking post-discharge placement for this patient at the following level of care:   [place]   (*CSW will update this form in Epic as items are completed)   08/05/2014  Patient/family provided with [place] Department of Clinical Social Work's list of facilities offering this level of care within the geographic area requested by the patient (or if unable, by the patient's family).  08/05/2014  Patient/family informed of their freedom to choose among providers that offer the needed level of care, that participate in Medicare, Medicaid or managed care program needed by the patient, have an available bed and are willing to accept the patient.  08/05/2014  Patient/family informed of [facility]' ownership interest in [facility], as well as of the fact that they are under no obligation to receive care at this facility.  PASARR submitted to EDS on 08/05/2014 PASARR number received on 08/05/2014  FL2 transmitted to all facilities in geographic area requested by pt/family on  08/05/2014 FL2 transmitted to all facilities within larger geographic area on   Patient informed that his/her managed care company has contracts with or will negotiate with  certain facilities, including the following:     Patient/family informed of bed offers received:   Patient chooses bed at  Physician recommends and patient chooses bed at    Patient to be transferred to  on   Patient to be transferred to facility by  Patient and family notified of transfer on  Name of family member notified:    The following physician request were entered in Epic:   Additional Comments:

## 2014-08-05 NOTE — Progress Notes (Signed)
Utilization review completed.  

## 2014-08-06 LAB — CBC
HCT: 28 % — ABNORMAL LOW (ref 36.0–46.0)
Hemoglobin: 9.5 g/dL — ABNORMAL LOW (ref 12.0–15.0)
MCH: 29.4 pg (ref 26.0–34.0)
MCHC: 33.9 g/dL (ref 30.0–36.0)
MCV: 86.7 fL (ref 78.0–100.0)
PLATELETS: 177 10*3/uL (ref 150–400)
RBC: 3.23 MIL/uL — ABNORMAL LOW (ref 3.87–5.11)
RDW: 12.8 % (ref 11.5–15.5)
WBC: 8.3 10*3/uL (ref 4.0–10.5)

## 2014-08-06 LAB — BASIC METABOLIC PANEL
ANION GAP: 8 (ref 5–15)
BUN: 18 mg/dL (ref 6–23)
CALCIUM: 8.6 mg/dL (ref 8.4–10.5)
CO2: 25 mEq/L (ref 19–32)
Chloride: 104 mEq/L (ref 96–112)
Creatinine, Ser: 0.67 mg/dL (ref 0.50–1.10)
GFR calc non Af Amer: 77 mL/min — ABNORMAL LOW (ref >90)
GFR, EST AFRICAN AMERICAN: 90 mL/min — AB (ref >90)
Glucose, Bld: 131 mg/dL — ABNORMAL HIGH (ref 70–99)
Potassium: 4.4 mEq/L (ref 3.7–5.3)
SODIUM: 137 meq/L (ref 137–147)

## 2014-08-06 MED ORDER — TRAMADOL HCL 50 MG PO TABS: 50.0000 mg | ORAL_TABLET | Freq: Four times a day (QID) | ORAL | Status: DC | PRN

## 2014-08-06 MED ORDER — RIVAROXABAN 10 MG PO TABS: 10.0000 mg | ORAL_TABLET | Freq: Every day | ORAL | Status: DC

## 2014-08-06 MED ORDER — METHOCARBAMOL 500 MG PO TABS: 500.0000 mg | ORAL_TABLET | Freq: Four times a day (QID) | ORAL | Status: DC | PRN

## 2014-08-06 MED ORDER — ALUM & MAG HYDROXIDE-SIMETH 200-200-20 MG/5ML PO SUSP: 30.0000 mL | ORAL | Status: DC | PRN

## 2014-08-06 MED ORDER — BISACODYL 10 MG RE SUPP
10.0000 mg | Freq: Once | RECTAL | Status: AC
  Administered 2014-08-06: 10 mg via RECTAL
  Filled 2014-08-06: qty 1

## 2014-08-06 MED ORDER — ONDANSETRON HCL 4 MG PO TABS: 4.0000 mg | ORAL_TABLET | Freq: Four times a day (QID) | ORAL | Status: DC | PRN

## 2014-08-06 MED ORDER — POLYETHYLENE GLYCOL 3350 17 G PO PACK: 17.0000 g | PACK | Freq: Every day | ORAL | Status: DC | PRN

## 2014-08-06 MED ORDER — METOCLOPRAMIDE HCL 5 MG PO TABS: 5.0000 mg | ORAL_TABLET | Freq: Three times a day (TID) | ORAL | Status: DC | PRN

## 2014-08-06 MED ORDER — DSS 100 MG PO CAPS
100.0000 mg | ORAL_CAPSULE | Freq: Two times a day (BID) | ORAL | Status: DC
Start: 2014-08-06 — End: 2015-04-28

## 2014-08-06 MED ORDER — BISACODYL 10 MG RE SUPP: 10.0000 mg | Freq: Every day | RECTAL | Status: DC | PRN

## 2014-08-06 MED ORDER — ACETAMINOPHEN 325 MG PO TABS: 650.0000 mg | ORAL_TABLET | Freq: Four times a day (QID) | ORAL | Status: DC | PRN

## 2014-08-06 MED ORDER — OXYCODONE HCL 5 MG PO TABS: 5.0000 mg | ORAL_TABLET | ORAL | Status: DC | PRN

## 2014-08-06 NOTE — Progress Notes (Signed)
   Subjective: 2 Days Post-Op Procedure(s) (LRB): RIGHT TOTAL HIP ARTHROPLASTY ANTERIOR APPROACH (Right) Patient reports pain as mild and moderate.   Patient seen in rounds with Dr. Wynelle Link. Patient is having problems with nausea and constipation today.  She does not feel good today. Will continue with therapy today. Keep today and plan on transfer tomorrow to [facility]. Plan is to go to [facility] after hospital stay.  Objective: Vital signs in last 24 hours: Temp:  [98.1 F (36.7 C)-98.6 F (37 C)] 98.6 F (37 C) (09/18 0625) Pulse Rate:  [50-61] 61 (09/18 0625) Resp:  [14-18] 14 (09/18 0755) BP: (119-141)/(42-60) 141/60 mmHg (09/18 0625) SpO2:  [98 %-100 %] 98 % (09/18 0625)  Intake/Output from previous day:  Intake/Output Summary (Last 24 hours) at 08/06/14 0823 Last data filed at 08/06/14 0755  Gross per 24 hour  Intake 1310.25 ml  Output    675 ml  Net 635.25 ml    Intake/Output this shift: Total I/O In: 850.3 [I.V.:850.3] Out: -   Labs:  Recent Labs  08/05/14 0535 08/06/14 0445  HGB 10.1* 9.5*    Recent Labs  08/05/14 0535 08/06/14 0445  WBC 7.0 8.3  RBC 3.49* 3.23*  HCT 30.6* 28.0*  PLT 169 177    Recent Labs  08/05/14 0535 08/06/14 0445  NA 136* 137  K 4.2 4.4  CL 103 104  CO2 25 25  BUN 17 18  CREATININE 0.71 0.67  GLUCOSE 166* 131*  CALCIUM 8.7 8.6   No results found for this basename: LABPT, INR,  in the last 72 hours  EXAM General - Patient is Alert and Appropriate Extremity - Neurovascular intact Sensation intact distally Dorsiflexion/Plantar flexion intact Dressing - dressing C/D/I, Incision healing well. Motor Function - intact, moving foot and toes well on exam.   Past Medical History  Diagnosis Date  . Hypertension   . Hypercholesteremia   . Glaucoma     bilateral  . Arthritis     osteoarthritis hips  . Fractures, multiple     2015- Rt. femur, hx, left elbow, rt. shoulder  . Cancer     skin cancer "basal cell",  Right  kidney cancer    Assessment/Plan: 2 Days Post-Op Procedure(s) (LRB): RIGHT TOTAL HIP ARTHROPLASTY ANTERIOR APPROACH (Right) Principal Problem:   OA (osteoarthritis) of hip  Estimated body mass index is 23.41 kg/(m^2) as calculated from the following:   Height as of this encounter: 5\' 6"  (1.676 m).   Weight as of this encounter: 65.772 kg (145 lb). Up with therapy Discharge to SNF likely tomorrow.  DVT Prophylaxis - Xarelto Weight Bearing As Tolerated right Leg Dulcolax suppository this morning. PRN nausea meds ordered. Continue therapy. Probably transfer tomorrow.  [name], PA-C Orthopaedic Surgery 08/06/2014, 8:23 AM

## 2014-08-06 NOTE — Progress Notes (Signed)
Physical Therapy Treatment Patient Details Name: Christy Houston MRN: [id number] DOB: [birth year] Today's Date: 08-09-14    History of Present Illness Pt is s/p R direct anterior THA. Pt with PMH glaucoma and HTN.    PT Comments    Pt continues to require increased time and cues to perform mobility and became nauseated today during ambulation (RN notified).  Follow Up Recommendations  SNF     Equipment Recommendations  None recommended by PT    Recommendations for Other Services       Precautions / Restrictions Precautions Precautions: Fall Precaution Comments: direct anterior approach Restrictions Weight Bearing Restrictions: No Other Position/Activity Restrictions: WBAT    Mobility  Bed Mobility Overal bed mobility: Needs Assistance Bed Mobility: Supine to Sit     Supine to sit: Min assist     General bed mobility comments: assist for R LE  Transfers Overall transfer level: Needs assistance Equipment used: Rolling walker (2 wheeled) Transfers: Sit to/from Stand Sit to Stand: Min assist         General transfer comment: verbal cues for UE and LE positioning, continues to require increased time to get BOS and perform hand placement  Ambulation/Gait Ambulation/Gait assistance: Min assist Ambulation Distance (Feet): 25 Feet Assistive device: Rolling walker (2 wheeled) Gait Pattern/deviations: Step-to pattern;Antalgic Gait velocity: decr   General Gait Details: verbal cues for sequence, RW positioning, posture, step length; pt reported nausea then dizziness then denied dizziness and just nausea so paused at sink upon return to room for safety then NT assist with ambulating over to recliner   Stairs            Wheelchair Mobility    Modified Rankin (Stroke Patients Only)       Balance                                    Cognition Arousal/Alertness: Awake/alert Behavior During Therapy: WFL for tasks  assessed/performed Overall Cognitive Status: Within Functional Limits for tasks assessed                      Exercises      General Comments        Pertinent Vitals/Pain Pain Assessment: 0-10 Pain Score: 3  Pain Location: R hip Pain Descriptors / Indicators: Aching;Sore Pain Intervention(s): Limited activity within patient's tolerance;Monitored during session;Premedicated before session;Repositioned    Home Living                      Prior Function            PT Goals (current goals can now be found in the care plan section) Progress towards PT goals: Progressing toward goals    Frequency  7X/week    PT Plan Current plan remains appropriate    Co-evaluation             End of Session   Activity Tolerance: Patient tolerated treatment well Patient left: with call bell/phone within reach;in chair     Time: 1020-1051 PT Time Calculation (min): 31 min  Charges:  $Gait Training: 23-37 mins                    G Codes:      Darren Caldron,[name] 09-Aug-2014, 11:53 AM [name], PT, DPT 08-09-2014 Pager: [phone]

## 2014-08-06 NOTE — Progress Notes (Signed)
Clinical Social Work  Per chart review, patient should DC on Saturday. CSW informed Altha Harm at [facility] plans and SNF is agreeable to accept over the weekend. Weekend CSW to fax DC summary to [phone] and call charge RN at [phone]. If any emergencies occur, then CSW can contact Christine at [phone]. CSW will continue to follow and assist as needed.  [place], [place] [phone]

## 2014-08-06 NOTE — Discharge Summary (Signed)
Physician Discharge Summary   Patient ID: Christy Houston MRN: [id number] DOB/AGE: 07-03-1926 80 y.o.  Admit date: 08/04/2014 Discharge date: Tentative Date of Discharge - Saturday - 08/07/2014  Primary Diagnosis:  Osteoarthritis of the Right hip.  Admission Diagnoses:  Past Medical History  Diagnosis Date  . Hypertension   . Hypercholesteremia   . Glaucoma     bilateral  . Arthritis     osteoarthritis hips  . Fractures, multiple     2015- Rt. femur, hx, left elbow, rt. shoulder  . Cancer     skin cancer "basal cell", Right  kidney cancer   Discharge Diagnoses:   Principal Problem:   OA (osteoarthritis) of hip  Estimated body mass index is 23.41 kg/(m^2) as calculated from the following:   Height as of this encounter: _0  (1.676 m).   Weight as of this encounter: 65.772 kg (145 lb).  Procedure(s) (LRB): RIGHT TOTAL HIP ARTHROPLASTY ANTERIOR APPROACH (Right)   Consults: None  HPI: Christy Houston is a 80 y.o. female who has advanced end-  stage arthritis of her Right hip with progressively worsening pain and  dysfunction.The patient has failed nonoperative management and presents for  total hip arthroplasty.   Laboratory Data: Admission on 08/04/2014  Component Date Value Ref Range Status  . WBC 08/05/2014 7.0  4.0 - 10.5 K/uL Final  . RBC 08/05/2014 3.49* 3.87 - 5.11 MIL/uL Final  . Hemoglobin 08/05/2014 10.1* 12.0 - 15.0 g/dL Final  . HCT 08/05/2014 30.6* 36.0 - 46.0 % Final  . MCV 08/05/2014 87.7  78.0 - 100.0 fL Final  . MCH 08/05/2014 28.9  26.0 - 34.0 pg Final  . MCHC 08/05/2014 33.0  30.0 - 36.0 g/dL Final  . RDW 08/05/2014 12.8  11.5 - 15.5 % Final  . Platelets 08/05/2014 169  150 - 400 K/uL Final  . Sodium 08/05/2014 136* 137 - 147 mEq/L Final  . Potassium 08/05/2014 4.2  3.7 - 5.3 mEq/L Final  . Chloride 08/05/2014 103  96 - 112 mEq/L Final  . CO2 08/05/2014 25  19 - 32 mEq/L Final  . Glucose, Bld 08/05/2014 166* 70 - 99 mg/dL Final    . BUN 08/05/2014 17  6 - 23 mg/dL Final  . Creatinine, Ser 08/05/2014 0.71  0.50 - 1.10 mg/dL Final  . Calcium 08/05/2014 8.7  8.4 - 10.5 mg/dL Final  . GFR calc non Af Amer 08/05/2014 76* >90 mL/min Final  . GFR calc Af Amer 08/05/2014 88* >90 mL/min Final   Comment: (NOTE)                          The eGFR has been calculated using the CKD EPI equation.                          This calculation has not been validated in all clinical situations.                          eGFR's persistently <90 mL/min signify possible Chronic Kidney                          Disease.  . Anion gap 08/05/2014 8  5 - 15 Final  . WBC 08/06/2014 8.3  4.0 - 10.5 K/uL Final  . RBC 08/06/2014 3.23* 3.87 - 5.11 MIL/uL Final  . Hemoglobin 08/06/2014  9.5* 12.0 - 15.0 g/dL Final  . HCT 08/06/2014 28.0* 36.0 - 46.0 % Final  . MCV 08/06/2014 86.7  78.0 - 100.0 fL Final  . MCH 08/06/2014 29.4  26.0 - 34.0 pg Final  . MCHC 08/06/2014 33.9  30.0 - 36.0 g/dL Final  . RDW 08/06/2014 12.8  11.5 - 15.5 % Final  . Platelets 08/06/2014 177  150 - 400 K/uL Final  . Sodium 08/06/2014 137  137 - 147 mEq/L Final  . Potassium 08/06/2014 4.4  3.7 - 5.3 mEq/L Final  . Chloride 08/06/2014 104  96 - 112 mEq/L Final  . CO2 08/06/2014 25  19 - 32 mEq/L Final  . Glucose, Bld 08/06/2014 131* 70 - 99 mg/dL Final  . BUN 08/06/2014 18  6 - 23 mg/dL Final  . Creatinine, Ser 08/06/2014 0.67  0.50 - 1.10 mg/dL Final  . Calcium 08/06/2014 8.6  8.4 - 10.5 mg/dL Final  . GFR calc non Af Amer 08/06/2014 77* >90 mL/min Final  . GFR calc Af Amer 08/06/2014 90* >90 mL/min Final   Comment: (NOTE)                          The eGFR has been calculated using the CKD EPI equation.                          This calculation has not been validated in all clinical situations.                          eGFR's persistently <90 mL/min signify possible Chronic Kidney                          Disease.  Christy Houston gap 08/06/2014 8  5 - 15 Final  Hospital  Outpatient Visit on 07/30/2014  Component Date Value Ref Range Status  . aPTT 07/30/2014 31  24 - 37 seconds Final  . WBC 07/30/2014 5.5  4.0 - 10.5 K/uL Final  . RBC 07/30/2014 4.52  3.87 - 5.11 MIL/uL Final  . Hemoglobin 07/30/2014 13.4  12.0 - 15.0 g/dL Final  . HCT 07/30/2014 40.0  36.0 - 46.0 % Final  . MCV 07/30/2014 88.5  78.0 - 100.0 fL Final  . MCH 07/30/2014 29.6  26.0 - 34.0 pg Final  . MCHC 07/30/2014 33.5  30.0 - 36.0 g/dL Final  . RDW 07/30/2014 13.0  11.5 - 15.5 % Final  . Platelets 07/30/2014 204  150 - 400 K/uL Final  . Sodium 07/30/2014 137  137 - 147 mEq/L Final  . Potassium 07/30/2014 4.9  3.7 - 5.3 mEq/L Final  . Chloride 07/30/2014 101  96 - 112 mEq/L Final  . CO2 07/30/2014 26  19 - 32 mEq/L Final  . Glucose, Bld 07/30/2014 90  70 - 99 mg/dL Final  . BUN 07/30/2014 18  6 - 23 mg/dL Final  . Creatinine, Ser 07/30/2014 0.72  0.50 - 1.10 mg/dL Final  . Calcium 07/30/2014 10.0  8.4 - 10.5 mg/dL Final  . Total Protein 07/30/2014 7.0  6.0 - 8.3 g/dL Final  . Albumin 07/30/2014 3.6  3.5 - 5.2 g/dL Final  . AST 07/30/2014 21  0 - 37 U/L Final  . ALT 07/30/2014 13  0 - 35 U/L Final  . Alkaline Phosphatase 07/30/2014 73  39 - 117 U/L Final  .  Total Bilirubin 07/30/2014 0.4  0.3 - 1.2 mg/dL Final  . GFR calc non Af Amer 07/30/2014 76* >90 mL/min Final  . GFR calc Af Amer 07/30/2014 88* >90 mL/min Final   Comment: (NOTE)                          The eGFR has been calculated using the CKD EPI equation.                          This calculation has not been validated in all clinical situations.                          eGFR's persistently <90 mL/min signify possible Chronic Kidney                          Disease.  . Anion gap 07/30/2014 10  5 - 15 Final  . Prothrombin Time 07/30/2014 13.6  11.6 - 15.2 seconds Final  . INR 07/30/2014 1.04  0.00 - 1.49 Final  . ABO/RH(D) 07/30/2014 A POS   Final  . Antibody Screen 07/30/2014 NEG   Final  . Sample Expiration 07/30/2014  08/07/2014   Final  . Color, Urine 07/30/2014 YELLOW  YELLOW Final  . APPearance 07/30/2014 CLEAR  CLEAR Final  . Specific Gravity, Urine 07/30/2014 1.019  1.005 - 1.030 Final  . pH 07/30/2014 6.0  5.0 - 8.0 Final  . Glucose, UA 07/30/2014 NEGATIVE  NEGATIVE mg/dL Final  . Hgb urine dipstick 07/30/2014 NEGATIVE  NEGATIVE Final  . Bilirubin Urine 07/30/2014 NEGATIVE  NEGATIVE Final  . Ketones, ur 07/30/2014 NEGATIVE  NEGATIVE mg/dL Final  . Protein, ur 07/30/2014 NEGATIVE  NEGATIVE mg/dL Final  . Urobilinogen, UA 07/30/2014 0.2  0.0 - 1.0 mg/dL Final  . Nitrite 07/30/2014 NEGATIVE  NEGATIVE Final  . Leukocytes, UA 07/30/2014 SMALL* NEGATIVE Final  . MRSA, PCR 07/30/2014 NEGATIVE  NEGATIVE Final  . Staphylococcus aureus 07/30/2014 NEGATIVE  NEGATIVE Final   Comment:                                 The Xpert SA Assay (FDA                          approved for NASAL specimens                          in patients over 78 years of age),                          is one component of                          a comprehensive surveillance                          program.  Test performance has                          been validated by [employer] for patients greater                          than or equal to 61 year old.                          It is not intended                          to diagnose infection nor to                          guide or monitor treatment.  . Squamous Epithelial / LPF 07/30/2014 RARE  RARE Final  . WBC, UA 07/30/2014 0-2  <3 WBC/hpf Final     X-Rays:Dg Hip Complete Right  07/30/2014   CLINICAL DATA:  RIGHT hip pain.  Arthritis.  EXAM: RIGHT HIP - COMPLETE 2+ VIEW  COMPARISON:  None.  FINDINGS: Severe RIGHT hip osteoarthritis is present. Complete loss of the joint space along the superior medial aspect. LEFT total hip arthroplasties noted. Pelvic rings appear intact. Atherosclerosis. Show lateral SI joint osteoarthritis.  IMPRESSION: Severe  RIGHT hip osteoarthritis.   Electronically Signed   By: Dereck Ligas M.D.   On: 07/30/2014 13:49   Dg Pelvis Portable  08/04/2014   CLINICAL DATA:  Status post right hip arthroplasty.  EXAM: PORTABLE PELVIS 1-2 VIEWS  COMPARISON:  04/01/2006  FINDINGS: Frontal projection shows normal alignment of a right hip arthroplasty. Left hip arthroplasty shows stable appearance. No evidence of fracture.  IMPRESSION: Anatomic alignment following right hip arthroplasty.   Electronically Signed   By: Aletta Edouard M.D.   On: 08/04/2014 18:33   Dg C-arm 1-60 Min-no Report  08/04/2014   CLINICAL DATA:  Status post right hip arthroplasty.  EXAM: PORTABLE PELVIS 1-2 VIEWS  COMPARISON:  04/01/2006  FINDINGS: Frontal projection shows normal alignment of a right hip arthroplasty. Left hip arthroplasty shows stable appearance. No evidence of fracture.  IMPRESSION: Anatomic alignment following right hip arthroplasty.   Electronically Signed   By: Aletta Edouard M.D.   On: 08/04/2014 18:33    EKG:No orders found for this or any previous visit.   Hospital Course: Patient was admitted to [facility] and taken to the OR and underwent the above state procedure without complications.  Patient tolerated the procedure well and was later transferred to the recovery room and then to the orthopaedic floor for postoperative care.  They were given PO and IV analgesics for pain control following their surgery.  They were given 24 hours of postoperative antibiotics of  Anti-infectives   Start     Dose/Rate Route Frequency Ordered Stop   08/04/14 2200  ceFAZolin (ANCEF) IVPB 2 g/50 mL premix     2 g 100 mL/hr over 30 Minutes Intravenous Every 6 hours 08/04/14 1851 08/05/14 0400   08/04/14 1215  ceFAZolin (ANCEF) IVPB 2 g/50 mL premix     2 g 100 mL/hr over 30 Minutes Intravenous On call to O.R. 08/04/14 1208 08/04/14 1603     and started on DVT prophylaxis in the form of Xarelto.   PT and OT were ordered for total hip  protocol.  The patient was allowed to be WBAT with therapy. Discharge planning was consulted to help with postop disposition and equipment needs.  Patient had a decent night on the evening of  surgery but with some pain.  They started to get up OOB with therapy on day one.  Hemovac drain was pulled without difficulty.  Continued to work with therapy into day two.  Dressing was changed on day two and the incision was healingwell.  Patient was seen in rounds on day two and was having problems with nausea and constipation that day. She did not feel good.  She was kept in house.   It was felt that as long as the patient improved, they might be ready to transfer the following day, Saturday 08/07/2014.  The patient will evaluated by the weekend coverage staff and will discharge if doing well.  This summary was prepared in anticipation of the the patient's transfer over the weekend.   Diet: Cardiac diet Activity:WBAT Follow-up:in 2 weeks Disposition - Skilled nursing facility - [facility] Discharged Condition: Pending at time of summary, Transfer on Saturday if doing well on weekend rounds.   Discharge Instructions   Call MD / Call 911    Complete by:  As directed   If you experience chest pain or shortness of breath, CALL 911 and be transported to the hospital emergency room.  If you develope a fever above 101 F, pus (white drainage) or increased drainage or redness at the wound, or calf pain, call your surgeon's office.     Change dressing    Complete by:  As directed   You may change your dressing dressing daily with sterile 4 x 4 inch gauze dressing and paper tape.  Do not submerge the incision under water.     Constipation Prevention    Complete by:  As directed   Drink plenty of fluids.  Prune juice may be helpful.  You may use a stool softener, such as Colace (over the counter) 100 mg twice a day.  Use MiraLax (over the counter) for constipation as needed.     Diet - low sodium heart healthy     Complete by:  As directed      Discharge instructions    Complete by:  As directed   Pick up stool softner and laxative for home. Do not submerge incision under water. May shower. Continue to use ice for pain and swelling from surgery.  Total Hip Protocol.  Take Xarelto for two and a half more weeks, then discontinue Xarelto. Once the patient has completed the Xarelto, they may resume the 81 mg Aspirin.  When discharged from the skilled rehab facility, please have the facility set up the patient's [place] prior to being released.  Also provide the patient with their medications at time of release from the facility to include their pain medication, the muscle relaxants, and their blood thinner medication.  If the patient is still at the rehab facility at time of follow up appointment, please also assist the patient in arranging follow up appointment in our office and any transportation needs.     Do not sit on low chairs, stoools or toilet seats, as it may be difficult to get up from low surfaces    Complete by:  As directed      Driving restrictions    Complete by:  As directed   No driving until released by the physician.     Increase activity slowly as tolerated    Complete by:  As directed      Lifting restrictions    Complete by:  As directed   No lifting until released by the physician.  Patient may shower    Complete by:  As directed   You may shower without a dressing once there is no drainage.  Do not wash over the wound.  If drainage remains, do not shower until drainage stops.     TED hose    Complete by:  As directed   Use stockings (TED hose) for 3 weeks on both leg(s).  You may remove them at night for sleeping.     Weight bearing as tolerated    Complete by:  As directed             Medication List    STOP taking these medications       aspirin EC 81 MG tablet     CRANBERRY FRUIT PO     GRAPE SEED EXTRACT PO     multivitamin with  minerals Tabs tablet      TAKE these medications       acetaminophen 325 MG tablet  Commonly known as:  TYLENOL  Take 2 tablets (650 mg total) by mouth every 6 (six) hours as needed for mild pain (or Fever >/= 101).     alum & mag hydroxide-simeth 200-200-20 MG/5ML suspension  Commonly known as:  MAALOX/MYLANTA  Take 30 mLs by mouth every 2 (two) hours as needed for indigestion or heartburn.     ASTEPRO 0.15 % Soln  Generic drug:  Azelastine HCl  Place into the nose. 2 sprays each nostril at bedtime     atorvastatin 10 MG tablet  Commonly known as:  LIPITOR  Take 10 mg by mouth every morning.     bisacodyl 10 MG suppository  Commonly known as:  DULCOLAX  Place 1 suppository (10 mg total) rectally daily as needed for moderate constipation.     DSS 100 MG Caps  Take 100 mg by mouth 2 (two) times daily.     FLORASTOR 250 MG capsule  Generic drug:  saccharomyces boulardii  Take 250 mg by mouth every morning.     methocarbamol 500 MG tablet  Commonly known as:  ROBAXIN  Take 1 tablet (500 mg total) by mouth every 6 (six) hours as needed for muscle spasms.     metoCLOPramide 5 MG tablet  Commonly known as:  REGLAN  Take 1-2 tablets (5-10 mg total) by mouth every 8 (eight) hours as needed for nausea (if ondansetron (ZOFRAN) ineffective.).     ondansetron 4 MG tablet  Commonly known as:  ZOFRAN  Take 1 tablet (4 mg total) by mouth every 6 (six) hours as needed for nausea.     oxyCODONE 5 MG immediate release tablet  Commonly known as:  Oxy IR/ROXICODONE  Take 1-2 tablets (5-10 mg total) by mouth every 3 (three) hours as needed for moderate pain, severe pain or breakthrough pain.     polyethylene glycol packet  Commonly known as:  MIRALAX / GLYCOLAX  Take 17 g by mouth daily as needed for mild constipation.     rivaroxaban 10 MG Tabs tablet  Commonly known as:  XARELTO  - Take 1 tablet (10 mg total) by mouth daily with breakfast. Take Xarelto for two and a half more  weeks, then discontinue Xarelto.  - Once the patient has completed the Xarelto, they may resume the 81 mg Aspirin.     traMADol 50 MG tablet  Commonly known as:  ULTRAM  Take 1-2 tablets (50-100 mg total) by mouth every 6 (six) hours as needed (mild pain).  travoprost (benzalkonium) 0.004 % ophthalmic solution  Commonly known as:  TRAVATAN  Place 1 drop into both eyes at bedtime.     valsartan 320 MG tablet  Commonly known as:  DIOVAN  Take 320 mg by mouth every evening.           Follow-up Information   Follow up with [name], MD. Schedule an appointment as soon as possible for a visit on 08/13/2014. (Call office at [phone] to set up for Friday appiointment follow up.)    Specialty:  Orthopedic Surgery   Contact information:   [address] [place] [address] [place] [address] [phone]       Signed: [name], PA-C Orthopaedic Surgery 08/06/2014, 8:42 AM

## 2014-08-06 NOTE — Progress Notes (Signed)
Physical Therapy Treatment Note   08/06/14 1553  PT Visit Information  Last PT Received On 08/06/14  Assistance Needed +1 (use gait belt)  History of Present Illness Pt is s/p R direct anterior THA. Pt with PMH glaucoma and HTN.  PT Time Calculation  PT Start Time 1532  PT Stop Time 1547  PT Time Calculation (min) 15 min  Subjective Data  Subjective Pt assisted out of chair and felt unable to tolerate ambulation this afternoon so assisted back to bed for exercises.  Precautions  Precautions Fall  Precaution Comments direct anterior approach  Restrictions  Other Position/Activity Restrictions WBAT  Pain Assessment  Pain Assessment 0-10  Pain Score 3  Pain Location R hip  Pain Descriptors / Indicators Aching;Sore  Pain Intervention(s) Monitored during session;Repositioned;Ice applied  Cognition  Arousal/Alertness Awake/alert  Behavior During Therapy WFL for tasks assessed/performed  Overall Cognitive Status Within Functional Limits for tasks assessed  Bed Mobility  Overal bed mobility Needs Assistance  Bed Mobility Sit to Supine  Sit to supine Min assist  General bed mobility comments assist for R LE  Transfers  Overall transfer level Needs assistance  Equipment used Rolling walker (2 wheeled)  Transfers Sit to/from [employer] Transfers  Sit to Stand Min assist  Stand pivot transfers Min assist  General transfer comment verbal cues for UE and LE positioning, continues to require increased time to get BOS and perform hand placement, increased time for small steps recliner to bed  Exercises  Exercises Total Joint  Total Joint Exercises  Ankle Circles/Pumps AROM;Both;15 reps  Quad Sets AROM;Both;15 reps  Short Arc Quad AROM;Right;10 reps  Heel Slides AAROM;Right;10 reps  Hip ABduction/ADduction AAROM;Right;10 reps  PT - End of Session  Equipment Utilized During Treatment Gait belt  Activity Tolerance Patient tolerated treatment well  Patient left in bed;with call  bell/phone within reach  PT - Assessment/Plan  PT Plan Current plan remains appropriate  PT Frequency 7X/week  Follow Up Recommendations SNF  PT equipment None recommended by PT  PT Goal Progression  Progress towards PT goals Progressing toward goals  PT General Charges  $$ ACUTE PT VISIT 1 Procedure  PT Treatments  $Therapeutic Exercise 8-22 mins   [name], PT, DPT 08/06/2014 Pager: [phone]

## 2014-08-07 DIAGNOSIS — R112 Nausea with vomiting, unspecified: Secondary | ICD-10-CM | POA: Diagnosis not present

## 2014-08-07 DIAGNOSIS — Z85528 Personal history of other malignant neoplasm of kidney: Secondary | ICD-10-CM | POA: Diagnosis not present

## 2014-08-07 DIAGNOSIS — S79929A Unspecified injury of unspecified thigh, initial encounter: Secondary | ICD-10-CM | POA: Diagnosis not present

## 2014-08-07 DIAGNOSIS — F419 Anxiety disorder, unspecified: Secondary | ICD-10-CM | POA: Diagnosis not present

## 2014-08-07 DIAGNOSIS — S79919A Unspecified injury of unspecified hip, initial encounter: Secondary | ICD-10-CM | POA: Diagnosis not present

## 2014-08-07 DIAGNOSIS — K5900 Constipation, unspecified: Secondary | ICD-10-CM | POA: Diagnosis not present

## 2014-08-07 DIAGNOSIS — Z85828 Personal history of other malignant neoplasm of skin: Secondary | ICD-10-CM | POA: Diagnosis not present

## 2014-08-07 DIAGNOSIS — Z5189 Encounter for other specified aftercare: Secondary | ICD-10-CM | POA: Diagnosis not present

## 2014-08-07 DIAGNOSIS — H409 Unspecified glaucoma: Secondary | ICD-10-CM | POA: Diagnosis not present

## 2014-08-07 DIAGNOSIS — E7800 Pure hypercholesterolemia, unspecified: Secondary | ICD-10-CM | POA: Diagnosis not present

## 2014-08-07 DIAGNOSIS — M169 Osteoarthritis of hip, unspecified: Secondary | ICD-10-CM | POA: Diagnosis not present

## 2014-08-07 DIAGNOSIS — M25559 Pain in unspecified hip: Secondary | ICD-10-CM | POA: Diagnosis not present

## 2014-08-07 DIAGNOSIS — R1084 Generalized abdominal pain: Secondary | ICD-10-CM | POA: Diagnosis not present

## 2014-08-07 DIAGNOSIS — Z471 Aftercare following joint replacement surgery: Secondary | ICD-10-CM | POA: Diagnosis not present

## 2014-08-07 DIAGNOSIS — R52 Pain, unspecified: Secondary | ICD-10-CM | POA: Diagnosis not present

## 2014-08-07 DIAGNOSIS — I10 Essential (primary) hypertension: Secondary | ICD-10-CM | POA: Diagnosis not present

## 2014-08-07 DIAGNOSIS — Z96649 Presence of unspecified artificial hip joint: Secondary | ICD-10-CM | POA: Diagnosis not present

## 2014-08-07 DIAGNOSIS — M1610 Unilateral primary osteoarthritis, unspecified hip: Secondary | ICD-10-CM | POA: Diagnosis not present

## 2014-08-07 DIAGNOSIS — Z96641 Presence of right artificial hip joint: Secondary | ICD-10-CM | POA: Diagnosis not present

## 2014-08-07 LAB — CBC
HCT: 29.3 % — ABNORMAL LOW (ref 36.0–46.0)
Hemoglobin: 9.4 g/dL — ABNORMAL LOW (ref 12.0–15.0)
MCH: 28.7 pg (ref 26.0–34.0)
MCHC: 32.1 g/dL (ref 30.0–36.0)
MCV: 89.6 fL (ref 78.0–100.0)
PLATELETS: 166 10*3/uL (ref 150–400)
RBC: 3.27 MIL/uL — ABNORMAL LOW (ref 3.87–5.11)
RDW: 13.2 % (ref 11.5–15.5)
WBC: 6.9 10*3/uL (ref 4.0–10.5)

## 2014-08-07 NOTE — Progress Notes (Signed)
Physical Therapy Treatment Patient Details Name: Christy Houston MRN: [id number] DOB: 1926/04/26 Today's Date: 08/07/2014    History of Present Illness Pt is s/p R direct anterior THA. Pt with PMH glaucoma and HTN.    PT Comments    POD # 3 am session.  Assisted pt OOB and amb to sink.  Pt required increased time and MAX VC's for safety and walker sequencing.  Amb to BR to void.  Assisted with hygiene as pt was unable to perform due to decreased balance.  Amb in hallway a limited distance then positioned in recliner with ICE.  Follow Up Recommendations  SNF ([place])     Equipment Recommendations       Recommendations for Other Services       Precautions / Restrictions Precautions Precautions: Fall Precaution Comments: direct anterior approach Restrictions Weight Bearing Restrictions: No Other Position/Activity Restrictions: WBAT    Mobility  Bed Mobility Overal bed mobility: Needs Assistance Bed Mobility: Supine to Sit     Supine to sit: Min assist     General bed mobility comments: assist for R LE and increased time  Transfers Overall transfer level: Needs assistance Equipment used: Rolling walker (2 wheeled) Transfers: Sit to/from Stand Sit to Stand: Min assist         General transfer comment: verbal cues for UE and LE positioning, continues to require increased time to get BOS and perform hand placement, increased time for small steps.  Ambulation/Gait Ambulation/Gait assistance: Min assist Ambulation Distance (Feet): 25 Feet Assistive device: Rolling walker (2 wheeled) Gait Pattern/deviations: Step-to pattern;Decreased step length - right;Decreased step length - left;Trunk flexed Gait velocity: decr   General Gait Details: 75% VC's on proper sequencing and increased time.  Amb from bed to sink.  Static standing x 4 min to brush teeth.  Required Min asssit for safety.  Amb to BR to void then amb limited distance in hallway.  Mild c/o nausea  with excersion.    Stairs            Wheelchair Mobility    Modified Rankin (Stroke Patients Only)       Balance                                    Cognition                            Exercises      General Comments        Pertinent Vitals/Pain      Home Living                      Prior Function            PT Goals (current goals can now be found in the care plan section) Progress towards PT goals: Progressing toward goals    Frequency  7X/week    PT Plan      Co-evaluation             End of Session Equipment Utilized During Treatment: Gait belt Activity Tolerance: Patient limited by fatigue Patient left: in chair;with call bell/phone within reach     Time: 3500-9381 PT Time Calculation (min): 32 min  Charges:  $Gait Training: 8-22 mins $Therapeutic Activity: 8-22 mins  G Codes:      Rica Koyanagi  PTA [facility]  Acute  Rehab Pager      [phone]

## 2014-08-07 NOTE — Progress Notes (Signed)
CARE MANAGEMENT NOTE 08/07/2014  Patient:  Christy Houston, Christy Houston   Account Number:  [id number]  Date Initiated:  08/05/2014  Documentation initiated by:  [facility]  Subjective/Objective Assessment:   adm: RIGHT TOTAL HIP ARTHROPLASTY ANTERIOR APPROACH (Right)     Action/Plan:   SNF   Anticipated DC Date:  08/07/2014   Anticipated DC Plan:  SKILLED NURSING FACILITY  In-house referral  Clinical Social Worker      DC Planning Services  CM consult      Choice offered to / List presented to:             Status of service:   Medicare Important Message given?  YES (If response is "NO", the following Medicare IM given date fields will be blank) Date Medicare IM given:  08/07/2014 Medicare IM given by:  [facility] Date Additional Medicare IM given:   Additional Medicare IM given by:    Discharge Disposition:  [place]  Per UR Regulation:    If discussed at Long Length of Stay Meetings, dates discussed:    Comments:  08/05/14 14:00 CM notes pt going to SNF; CSW arranging.  No other CM needs were communicated.  Mariane Masters, BSN, CM [phone].

## 2014-08-07 NOTE — Progress Notes (Signed)
Subjective: [name] reports feeling better today and has no new complaints. She has minimal pain into her R hip. She has been up with therapy and has done okay with that. She is ready for D/C to [place]. She denies any new HA, CP, SOB, N,V, fever, chills, calf pain or swelling. Her appetite has increased and she has had appropriate BMs overnight and this morning.   Objective: Vital signs in last 24 hours: Temp:  [98.3 F (36.8 C)-98.5 F (36.9 C)] 98.5 F (36.9 C) (09/19 0629) Pulse Rate:  [68-85] 72 (09/19 0629) Resp:  [14-18] 18 (09/19 0629) BP: (123-141)/(43-52) 137/52 mmHg (09/19 0629) SpO2:  [98 %-100 %] 99 % (09/19 0629)  Intake/Output from previous day: 09/18 0701 - 09/19 0700 In: 1450.3 [P.O.:600; I.V.:850.3] Out: 250 [Urine:250] Intake/Output this shift:     Recent Labs  08/05/14 0535 08/06/14 0445 08/07/14 0453  HGB 10.1* 9.5* 9.4*    Recent Labs  08/06/14 0445 08/07/14 0453  WBC 8.3 6.9  RBC 3.23* 3.27*  HCT 28.0* 29.3*  PLT 177 166    Recent Labs  08/05/14 0535 08/06/14 0445  NA 136* 137  K 4.2 4.4  CL 103 104  CO2 25 25  BUN 17 18  CREATININE 0.71 0.67  GLUCOSE 166* 131*  CALCIUM 8.7 8.6   No results found for this basename: LABPT, INR,  in the last 72 hours  WD/WN caucasian female in nad. A and O x4. Mood and affect appropriate. EOMI. Respirations normal and unlabored. Abdomen soft and non-tender. No tenderness to palpation of R hip. Good quad contractions bilaterally. NV intact with distal pulses at 2+ and 5/5 strength of toe extensors and flexors bilaterally. Distal sensation intact bilaterally. No lymphadenopathy. No calf swelling or palpable cords.   Assessment/Plan: -Plan for D/C to [place] today, 9/19.  -Continue WBAT to RLE, Up with PT -Follow instructions per discharge summary- Total hip protocol, Xarelto for 2.5 more weeks, followed by 81mg  ASA -Continue Tramadol and Tylenol prn pain    Christy Rawl  Houston 08/07/2014, 7:42 AM  [phone]

## 2014-08-25 DIAGNOSIS — F419 Anxiety disorder, unspecified: Secondary | ICD-10-CM | POA: Diagnosis not present

## 2014-08-25 DIAGNOSIS — R52 Pain, unspecified: Secondary | ICD-10-CM | POA: Diagnosis not present

## 2014-08-25 DIAGNOSIS — R112 Nausea with vomiting, unspecified: Secondary | ICD-10-CM | POA: Diagnosis not present

## 2014-09-03 DIAGNOSIS — R2689 Other abnormalities of gait and mobility: Secondary | ICD-10-CM | POA: Diagnosis not present

## 2014-09-06 DIAGNOSIS — R2689 Other abnormalities of gait and mobility: Secondary | ICD-10-CM | POA: Diagnosis not present

## 2014-09-07 DIAGNOSIS — Z471 Aftercare following joint replacement surgery: Secondary | ICD-10-CM | POA: Diagnosis not present

## 2014-09-07 DIAGNOSIS — Z96641 Presence of right artificial hip joint: Secondary | ICD-10-CM | POA: Diagnosis not present

## 2014-09-08 DIAGNOSIS — R2689 Other abnormalities of gait and mobility: Secondary | ICD-10-CM | POA: Diagnosis not present

## 2014-09-13 DIAGNOSIS — R2689 Other abnormalities of gait and mobility: Secondary | ICD-10-CM | POA: Diagnosis not present

## 2014-09-15 DIAGNOSIS — R2689 Other abnormalities of gait and mobility: Secondary | ICD-10-CM | POA: Diagnosis not present

## 2014-09-20 DIAGNOSIS — R2689 Other abnormalities of gait and mobility: Secondary | ICD-10-CM | POA: Diagnosis not present

## 2014-09-22 DIAGNOSIS — R2689 Other abnormalities of gait and mobility: Secondary | ICD-10-CM | POA: Diagnosis not present

## 2014-09-24 DIAGNOSIS — H4011X2 Primary open-angle glaucoma, moderate stage: Secondary | ICD-10-CM | POA: Diagnosis not present

## 2014-10-19 DIAGNOSIS — Z96641 Presence of right artificial hip joint: Secondary | ICD-10-CM | POA: Diagnosis not present

## 2014-10-19 DIAGNOSIS — Z471 Aftercare following joint replacement surgery: Secondary | ICD-10-CM | POA: Diagnosis not present

## 2014-11-18 DIAGNOSIS — Z96641 Presence of right artificial hip joint: Secondary | ICD-10-CM | POA: Diagnosis not present

## 2014-11-18 DIAGNOSIS — Z96642 Presence of left artificial hip joint: Secondary | ICD-10-CM | POA: Diagnosis not present

## 2014-11-18 DIAGNOSIS — Z471 Aftercare following joint replacement surgery: Secondary | ICD-10-CM | POA: Diagnosis not present

## 2014-11-23 DIAGNOSIS — L988 Other specified disorders of the skin and subcutaneous tissue: Secondary | ICD-10-CM | POA: Diagnosis not present

## 2014-11-23 DIAGNOSIS — L821 Other seborrheic keratosis: Secondary | ICD-10-CM | POA: Diagnosis not present

## 2014-11-23 DIAGNOSIS — D1801 Hemangioma of skin and subcutaneous tissue: Secondary | ICD-10-CM | POA: Diagnosis not present

## 2014-11-23 DIAGNOSIS — L814 Other melanin hyperpigmentation: Secondary | ICD-10-CM | POA: Diagnosis not present

## 2014-11-23 DIAGNOSIS — R2689 Other abnormalities of gait and mobility: Secondary | ICD-10-CM | POA: Diagnosis not present

## 2014-11-25 DIAGNOSIS — R2689 Other abnormalities of gait and mobility: Secondary | ICD-10-CM | POA: Diagnosis not present

## 2014-11-30 DIAGNOSIS — R2689 Other abnormalities of gait and mobility: Secondary | ICD-10-CM | POA: Diagnosis not present

## 2014-12-02 DIAGNOSIS — R2689 Other abnormalities of gait and mobility: Secondary | ICD-10-CM | POA: Diagnosis not present

## 2014-12-03 DIAGNOSIS — R2689 Other abnormalities of gait and mobility: Secondary | ICD-10-CM | POA: Diagnosis not present

## 2014-12-03 DIAGNOSIS — M79674 Pain in right toe(s): Secondary | ICD-10-CM | POA: Diagnosis not present

## 2014-12-03 DIAGNOSIS — M79671 Pain in right foot: Secondary | ICD-10-CM | POA: Diagnosis not present

## 2014-12-03 DIAGNOSIS — B351 Tinea unguium: Secondary | ICD-10-CM | POA: Diagnosis not present

## 2014-12-07 DIAGNOSIS — R2689 Other abnormalities of gait and mobility: Secondary | ICD-10-CM | POA: Diagnosis not present

## 2014-12-09 DIAGNOSIS — R2689 Other abnormalities of gait and mobility: Secondary | ICD-10-CM | POA: Diagnosis not present

## 2014-12-14 DIAGNOSIS — R2689 Other abnormalities of gait and mobility: Secondary | ICD-10-CM | POA: Diagnosis not present

## 2014-12-15 DIAGNOSIS — R2689 Other abnormalities of gait and mobility: Secondary | ICD-10-CM | POA: Diagnosis not present

## 2014-12-16 DIAGNOSIS — Z96642 Presence of left artificial hip joint: Secondary | ICD-10-CM | POA: Diagnosis not present

## 2014-12-16 DIAGNOSIS — Z471 Aftercare following joint replacement surgery: Secondary | ICD-10-CM | POA: Diagnosis not present

## 2014-12-16 DIAGNOSIS — Z96641 Presence of right artificial hip joint: Secondary | ICD-10-CM | POA: Diagnosis not present

## 2014-12-23 DIAGNOSIS — R2689 Other abnormalities of gait and mobility: Secondary | ICD-10-CM | POA: Diagnosis not present

## 2014-12-30 DIAGNOSIS — Z9181 History of falling: Secondary | ICD-10-CM | POA: Diagnosis not present

## 2014-12-30 DIAGNOSIS — J019 Acute sinusitis, unspecified: Secondary | ICD-10-CM | POA: Diagnosis not present

## 2014-12-30 DIAGNOSIS — Z6824 Body mass index (BMI) 24.0-24.9, adult: Secondary | ICD-10-CM | POA: Diagnosis not present

## 2014-12-30 DIAGNOSIS — C641 Malignant neoplasm of right kidney, except renal pelvis: Secondary | ICD-10-CM | POA: Diagnosis not present

## 2014-12-30 DIAGNOSIS — I10 Essential (primary) hypertension: Secondary | ICD-10-CM | POA: Diagnosis not present

## 2014-12-30 DIAGNOSIS — E785 Hyperlipidemia, unspecified: Secondary | ICD-10-CM | POA: Diagnosis not present

## 2014-12-30 DIAGNOSIS — R05 Cough: Secondary | ICD-10-CM | POA: Diagnosis not present

## 2014-12-30 DIAGNOSIS — Z1389 Encounter for screening for other disorder: Secondary | ICD-10-CM | POA: Diagnosis not present

## 2014-12-30 DIAGNOSIS — E559 Vitamin D deficiency, unspecified: Secondary | ICD-10-CM | POA: Diagnosis not present

## 2015-01-27 DIAGNOSIS — Z471 Aftercare following joint replacement surgery: Secondary | ICD-10-CM | POA: Diagnosis not present

## 2015-01-27 DIAGNOSIS — Z96641 Presence of right artificial hip joint: Secondary | ICD-10-CM | POA: Diagnosis not present

## 2015-02-04 DIAGNOSIS — M79675 Pain in left toe(s): Secondary | ICD-10-CM | POA: Diagnosis not present

## 2015-02-04 DIAGNOSIS — B351 Tinea unguium: Secondary | ICD-10-CM | POA: Diagnosis not present

## 2015-02-04 DIAGNOSIS — M79674 Pain in right toe(s): Secondary | ICD-10-CM | POA: Diagnosis not present

## 2015-02-08 DIAGNOSIS — H4011X2 Primary open-angle glaucoma, moderate stage: Secondary | ICD-10-CM | POA: Diagnosis not present

## 2015-02-27 IMAGING — CR DG HIP COMPLETE 2+V*R*
3 series · 3 of 3 positions shown · non-contrast
Comparison: None.

CLINICAL DATA: RIGHT hip pain.  Arthritis.

EXAM:
RIGHT HIP - COMPLETE 2+ VIEW

[t pelvis a.p.]
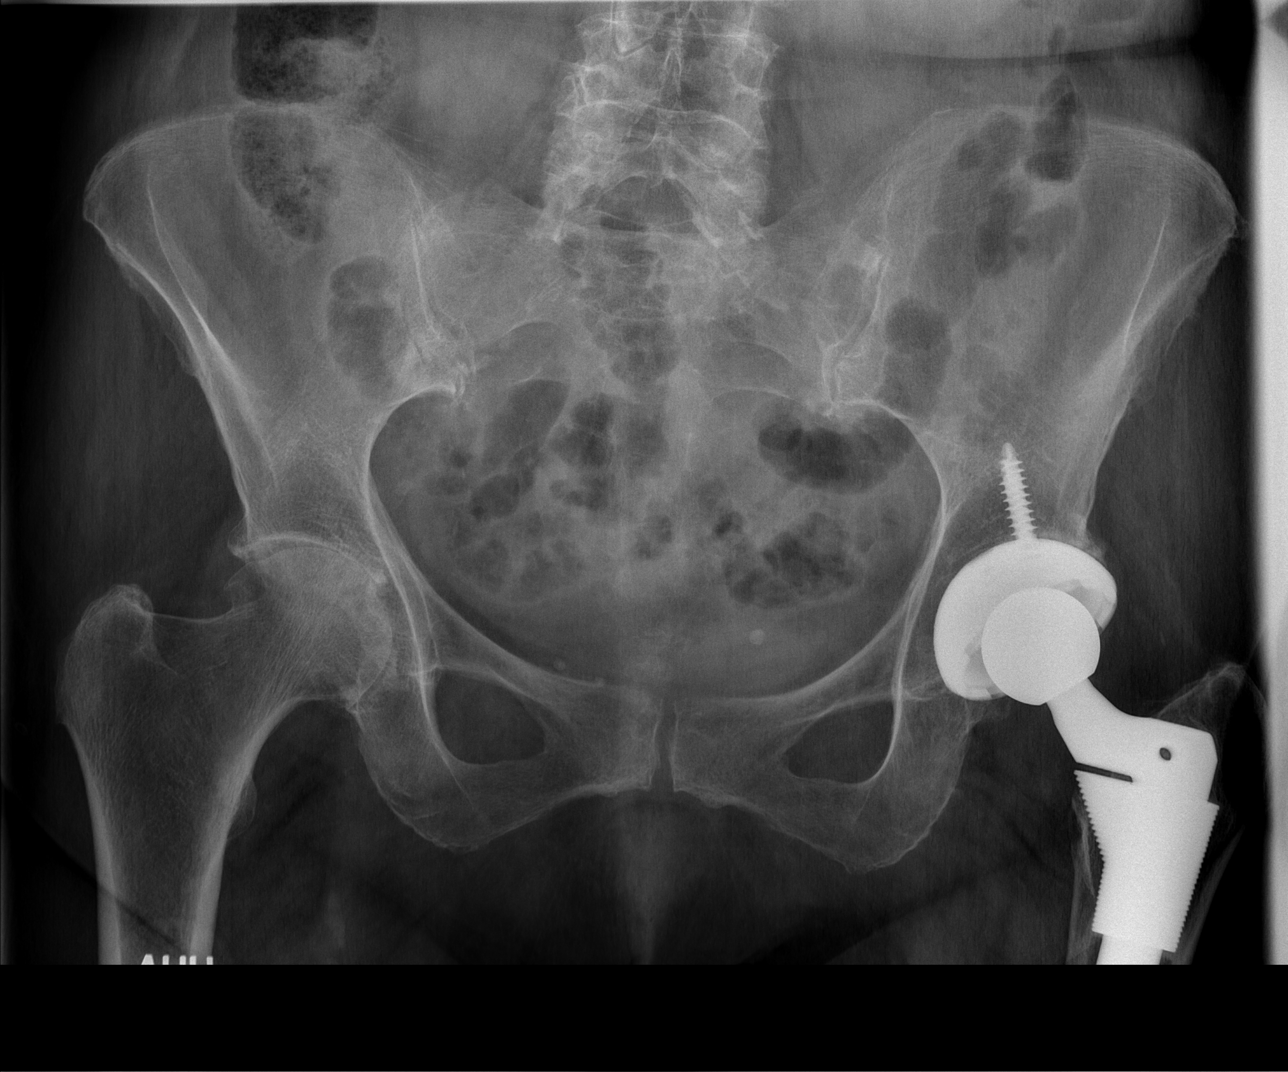

[t hip ap right]
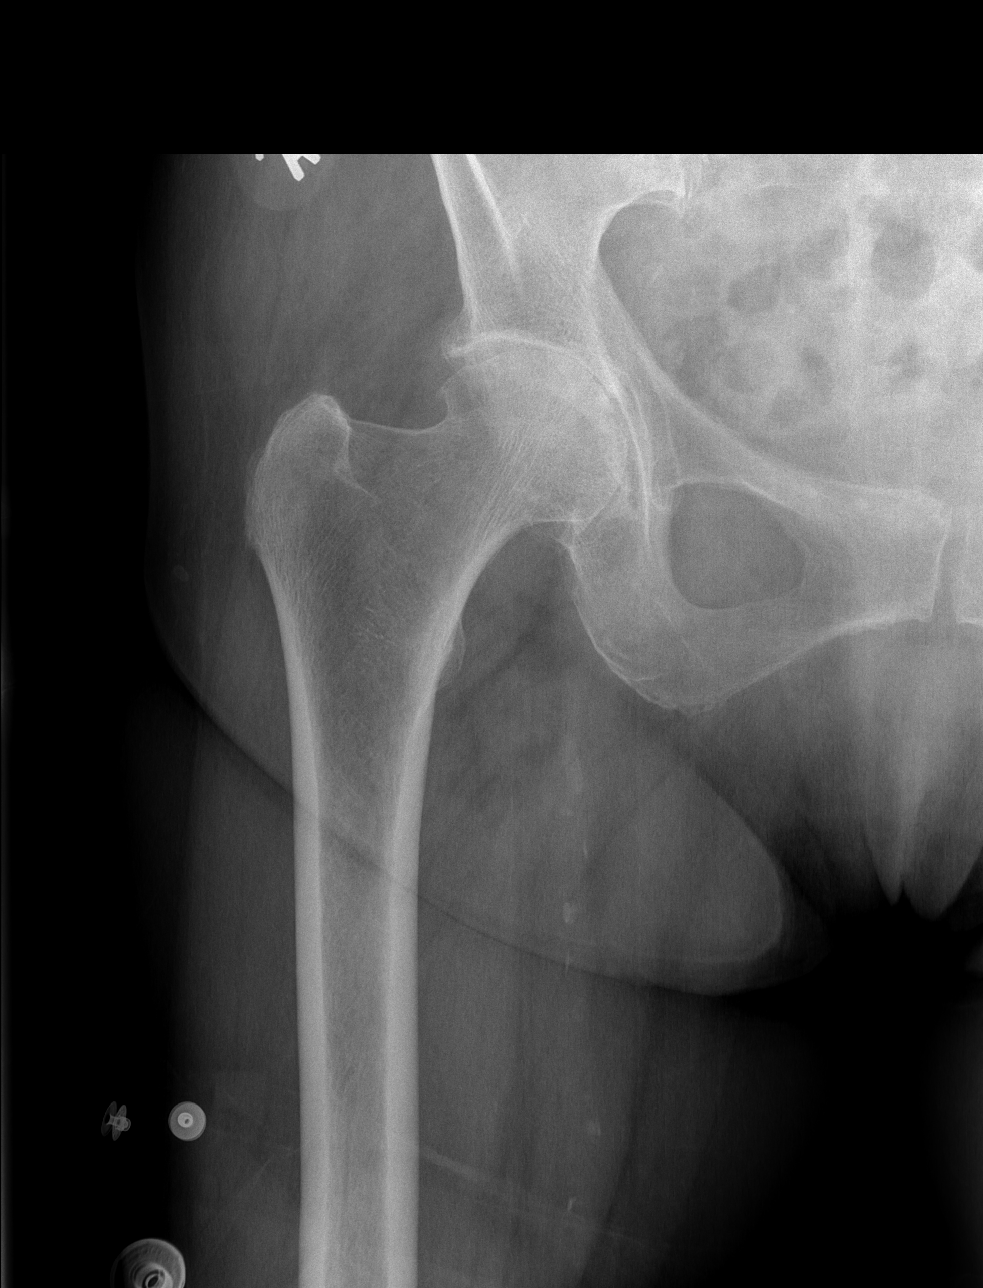

[t hip frog leg right]
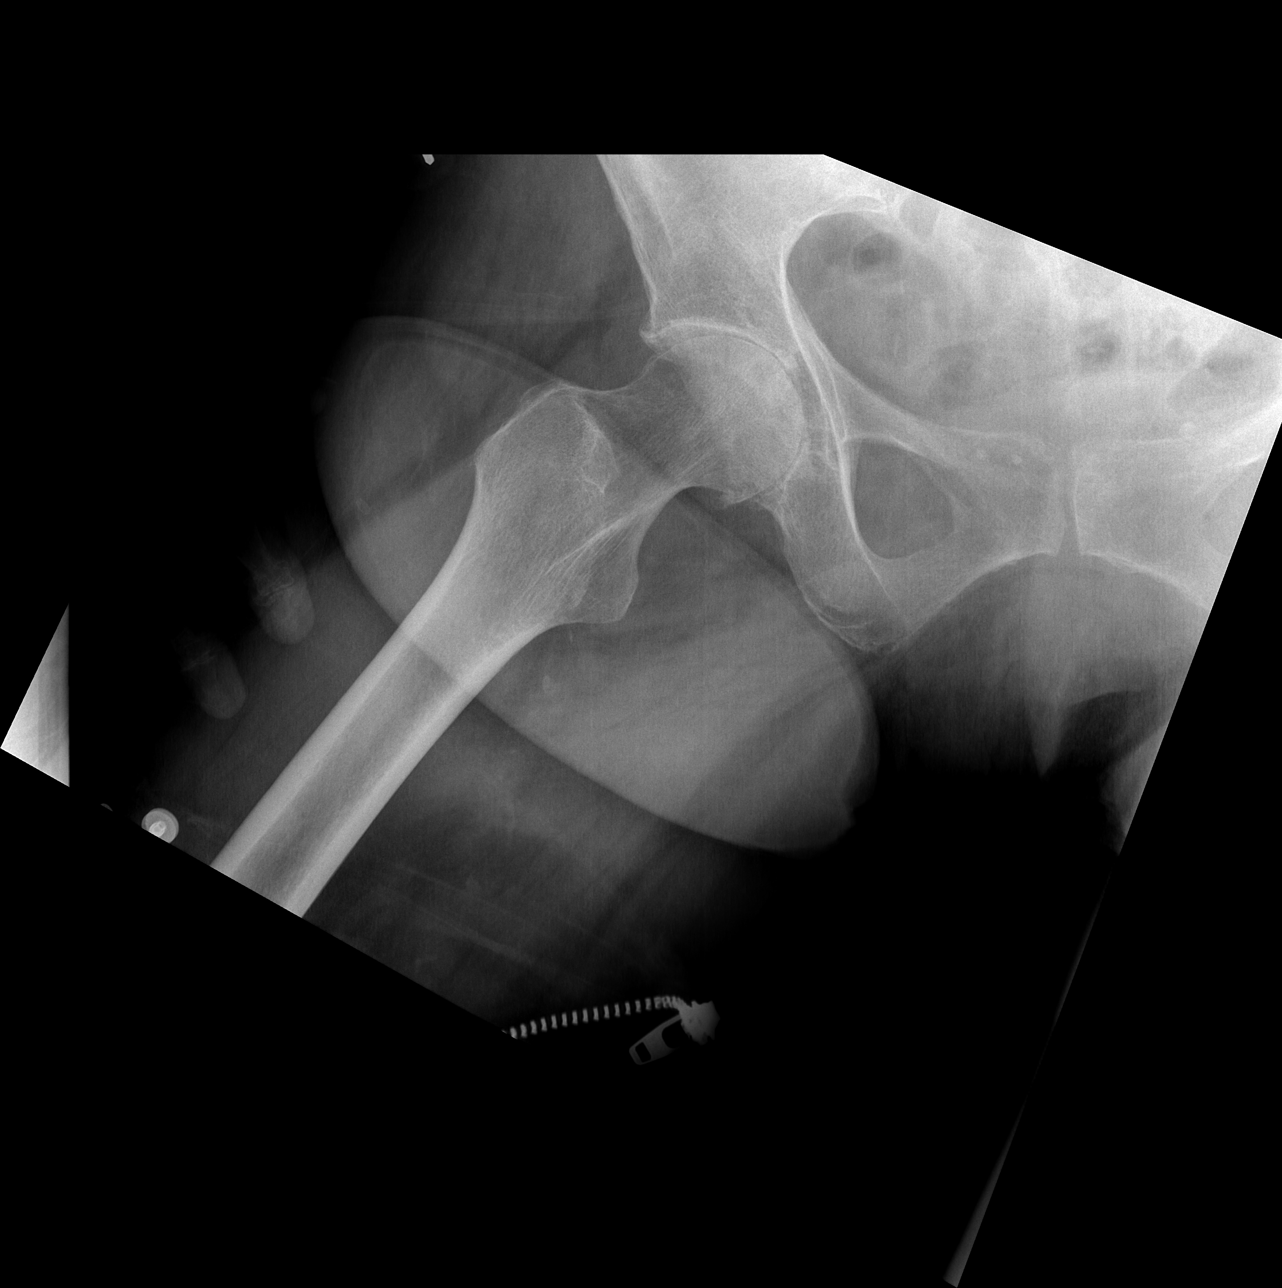

[3 of 3 positions shown; findings below may reference images not displayed]

FINDINGS: Severe RIGHT hip osteoarthritis is present. Complete loss of the
joint space along the superior medial aspect. LEFT total hip
arthroplasties noted. Pelvic rings appear intact. Atherosclerosis.
Show lateral SI joint osteoarthritis.
IMPRESSION: Severe RIGHT hip osteoarthritis.

## 2015-03-21 DIAGNOSIS — Z6824 Body mass index (BMI) 24.0-24.9, adult: Secondary | ICD-10-CM | POA: Diagnosis not present

## 2015-03-21 DIAGNOSIS — L719 Rosacea, unspecified: Secondary | ICD-10-CM | POA: Diagnosis not present

## 2015-03-21 DIAGNOSIS — R197 Diarrhea, unspecified: Secondary | ICD-10-CM | POA: Diagnosis not present

## 2015-04-15 DIAGNOSIS — B351 Tinea unguium: Secondary | ICD-10-CM | POA: Diagnosis not present

## 2015-04-15 DIAGNOSIS — M79672 Pain in left foot: Secondary | ICD-10-CM | POA: Diagnosis not present

## 2015-04-15 DIAGNOSIS — M79675 Pain in left toe(s): Secondary | ICD-10-CM | POA: Diagnosis not present

## 2015-04-19 ENCOUNTER — Ambulatory Visit: Payer: Self-pay | Admitting: Orthopedic Surgery

## 2015-04-19 NOTE — Progress Notes (Signed)
Preoperative surgical orders have been place into the Epic hospital system for Robert Bellow on 04/19/2015, 5:35 PM  by Mickel Crow for surgery on 05-09-2015.  Preop Hip orders including Experel Injecion, IV Tylenol, and IV Decadron as long as there are no contraindications to the above medications. [name], PA-C

## 2015-04-25 DIAGNOSIS — H43391 Other vitreous opacities, right eye: Secondary | ICD-10-CM | POA: Diagnosis not present

## 2015-04-25 DIAGNOSIS — H5203 Hypermetropia, bilateral: Secondary | ICD-10-CM | POA: Diagnosis not present

## 2015-04-25 DIAGNOSIS — Z83511 Family history of glaucoma: Secondary | ICD-10-CM | POA: Diagnosis not present

## 2015-04-25 DIAGNOSIS — H4011X2 Primary open-angle glaucoma, moderate stage: Secondary | ICD-10-CM | POA: Diagnosis not present

## 2015-05-02 NOTE — Patient Instructions (Addendum)
Christy Houston  05/02/2015   Your procedure is scheduled on:    05/09/2015    Report to [facility] Main  Entrance and follow signs to               [place] at     1150 am    Call this number if you have problems the morning of surgery [phone]   Remember: ONLY 1 PERSON MAY GO WITH YOU TO SHORT STAY TO GET  READY MORNING OF [place].  Do not eat foodafter midnite.  May have clear liquids until 0730am then nothing by mouth.       Take these medicines the morning of surgery with A SIP OF WATER: none                                You may not have any metal on your body including hair pins and              piercings  Do not wear jewelry, make-up, lotions, powders or perfumes, deodorant             Do not wear nail polish.  Do not shave  48 hours prior to surgery.     Do not bring valuables to the hospital. [place].  Contacts, dentures or bridgework may not be worn into surgery.  Leave suitcase in the car. After surgery it may be brought to your room.        Special Instructions: coughing and deep breathing exercises, leg exercises               Please read over the following fact sheets you were given: _____________________________________________________________________             [facility] - Preparing for Surgery Before surgery, you can play an important role.  Because skin is not sterile, your skin needs to be as free of germs as possible.  You can reduce the number of germs on your skin by washing with CHG (chlorahexidine gluconate) soap before surgery.  CHG is an antiseptic cleaner which kills germs and bonds with the skin to continue killing germs even after washing. Please DO NOT use if you have an allergy to CHG or antibacterial soaps.  If your skin becomes reddened/irritated stop using the CHG and inform your nurse when you arrive at Short Stay. Do not shave (including  legs and underarms) for at least 48 hours prior to the first CHG shower.  You may shave your face/neck. Please follow these instructions carefully:  1.  Shower with CHG Soap the night before surgery and the  morning of Surgery.  2.  If you choose to wash your hair, wash your hair first as usual with your  normal  shampoo.  3.  After you shampoo, rinse your hair and body thoroughly to remove the  shampoo.                           4.  Use CHG as you would any other liquid soap.  You can apply chg directly  to the skin and wash  Gently with a scrungie or clean washcloth.  5.  Apply the CHG Soap to your body ONLY FROM THE NECK DOWN.   Do not use on face/ open                           Wound or open sores. Avoid contact with eyes, ears mouth and genitals (private parts).                       Wash face,  Genitals (private parts) with your normal soap.             6.  Wash thoroughly, paying special attention to the area where your surgery  will be performed.  7.  Thoroughly rinse your body with warm water from the neck down.  8.  DO NOT shower/wash with your normal soap after using and rinsing off  the CHG Soap.                9.  Pat yourself dry with a clean towel.            10.  Wear clean pajamas.            11.  Place clean sheets on your bed the night of your first shower and do not  sleep with pets. Day of Surgery : Do not apply any lotions/deodorants the morning of surgery.  Please wear clean clothes to the hospital/surgery center.  FAILURE TO FOLLOW THESE INSTRUCTIONS MAY RESULT IN THE CANCELLATION OF YOUR SURGERY PATIENT SIGNATURE_________________________________  NURSE SIGNATURE__________________________________  ________________________________________________________________________   Christy Houston  An incentive spirometer is a tool that can help keep your lungs clear and active. This tool measures how well you are filling your lungs with each breath.  Taking long deep breaths may help reverse or decrease the chance of developing breathing (pulmonary) problems (especially infection) following:  A long period of time when you are unable to move or be active. BEFORE THE PROCEDURE   If the spirometer includes an indicator to show your best effort, your nurse or respiratory therapist will set it to a desired goal.  If possible, sit up straight or lean slightly forward. Try not to slouch.  Hold the incentive spirometer in an upright position. INSTRUCTIONS FOR USE  1. Sit on the edge of your bed if possible, or sit up as far as you can in bed or on a chair. 2. Hold the incentive spirometer in an upright position. 3. Breathe out normally. 4. Place the mouthpiece in your mouth and seal your lips tightly around it. 5. Breathe in slowly and as deeply as possible, raising the piston or the ball toward the top of the column. 6. Hold your breath for 3-5 seconds or for as long as possible. Allow the piston or ball to fall to the bottom of the column. 7. Remove the mouthpiece from your mouth and breathe out normally. 8. Rest for a few seconds and repeat Steps 1 through 7 at least 10 times every 1-2 hours when you are awake. Take your time and take a few normal breaths between deep breaths. 9. The spirometer may include an indicator to show your best effort. Use the indicator as a goal to work toward during each repetition. 10. After each set of 10 deep breaths, practice coughing to be sure your lungs are clear. If you have an incision (the cut made at the time of surgery),  support your incision when coughing by placing a pillow or rolled up towels firmly against it. Once you are able to get out of bed, walk around indoors and cough well. You may stop using the incentive spirometer when instructed by your caregiver.  RISKS AND COMPLICATIONS  Take your time so you do not get dizzy or light-headed.  If you are in pain, you may need to take or ask for pain  medication before doing incentive spirometry. It is harder to take a deep breath if you are having pain. AFTER USE  Rest and breathe slowly and easily.  It can be helpful to keep track of a log of your progress. Your caregiver can provide you with a simple table to help with this. If you are using the spirometer at home, follow these instructions: [place] IF:   You are having difficultly using the spirometer.  You have trouble using the spirometer as often as instructed.  Your pain medication is not giving enough relief while using the spirometer.  You develop fever of 100.5 F (38.1 C) or higher. SEEK IMMEDIATE MEDICAL CARE IF:   You cough up bloody sputum that had not been present before.  You develop fever of 102 F (38.9 C) or greater.  You develop worsening pain at or near the incision site. MAKE SURE YOU:   Understand these instructions.  Will watch your condition.  Will get help right away if you are not doing well or get worse. Document Released: 03/18/2007 Document Revised: 01/28/2012 Document Reviewed: 05/19/2007 ExitCare Patient Information 2014 [place].   ________________________________________________________________________    CLEAR LIQUID DIET   Foods Allowed                                                                     Foods Excluded  Coffee and tea, regular and decaf                             liquids that you cannot  Plain Jell-O in any flavor                                             see through such as: Fruit ices (not with fruit pulp)                                     milk, soups, orange juice  Iced Popsicles                                    All solid food Carbonated beverages, regular and diet                                    Cranberry, grape and apple juices Sports drinks like Gatorade Lightly seasoned clear broth or consume(fat free) Sugar, honey syrup  Sample Menu Breakfast  Lunch                                     Supper Cranberry juice                    Beef broth                            Chicken broth Jell-O                                     Grape juice                           Apple juice Coffee or tea                        Jell-O                                      Popsicle                                                Coffee or tea                        Coffee or tea  _____________________________________________________________________

## 2015-05-05 ENCOUNTER — Encounter (HOSPITAL_COMMUNITY)
Admission: RE | Admit: 2015-05-05 | Discharge: 2015-05-05 | Disposition: A | Discharge status: Home / Self Care | Payer: Medicare Other | Source: Ambulatory Visit | Attending: Orthopedic Surgery | Admitting: Orthopedic Surgery

## 2015-05-05 ENCOUNTER — Encounter (HOSPITAL_COMMUNITY): Payer: Self-pay

## 2015-05-05 DIAGNOSIS — M948X9 Other specified disorders of cartilage, unspecified sites: Secondary | ICD-10-CM | POA: Insufficient documentation

## 2015-05-05 DIAGNOSIS — Z01818 Encounter for other preprocedural examination: Secondary | ICD-10-CM | POA: Insufficient documentation

## 2015-05-05 DIAGNOSIS — M169 Osteoarthritis of hip, unspecified: Secondary | ICD-10-CM | POA: Insufficient documentation

## 2015-05-05 LAB — CBC
HEMATOCRIT: 41.6 % (ref 36.0–46.0)
Hemoglobin: 13.6 g/dL (ref 12.0–15.0)
MCH: 28 pg (ref 26.0–34.0)
MCHC: 32.7 g/dL (ref 30.0–36.0)
MCV: 85.8 fL (ref 78.0–100.0)
Platelets: 205 10*3/uL (ref 150–400)
RBC: 4.85 MIL/uL (ref 3.87–5.11)
RDW: 13.7 % (ref 11.5–15.5)
WBC: 6.3 10*3/uL (ref 4.0–10.5)

## 2015-05-05 LAB — BASIC METABOLIC PANEL
Anion gap: 6 (ref 5–15)
BUN: 21 mg/dL — ABNORMAL HIGH (ref 6–20)
CHLORIDE: 105 mmol/L (ref 101–111)
CO2: 29 mmol/L (ref 22–32)
Calcium: 9.9 mg/dL (ref 8.9–10.3)
Creatinine, Ser: 0.73 mg/dL (ref 0.44–1.00)
GFR calc Af Amer: >60 mL/min (ref >60)
GLUCOSE: 88 mg/dL (ref 65–99)
POTASSIUM: 4.5 mmol/L (ref 3.5–5.1)
SODIUM: 140 mmol/L (ref 135–145)

## 2015-05-05 NOTE — Progress Notes (Signed)
EKG 05/2014 on chart  LOV with DR Delena Bali ( PCP) - 03/21/2015 on chart

## 2015-05-05 NOTE — Progress Notes (Signed)
BMP results done 05/05/2015 faxed via EPIC to Dr Wynelle Link.

## 2015-05-08 DIAGNOSIS — M898X9 Other specified disorders of bone, unspecified site: Secondary | ICD-10-CM | POA: Diagnosis present

## 2015-05-08 NOTE — H&P (Signed)
  CC- Christy Houston is a 81 y.o. female who presents with right hip pain  Hip Pain: Patient complains of right hip pain. Onset of the symptoms was several months ago. 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 She presents now for excision of the heterotopic bone. Current symptoms include pain and stiffness right hip    Past Medical History  Diagnosis Date  . Hypertension   . Hypercholesteremia   . Glaucoma     bilateral  . Arthritis     osteoarthritis hips  . Fractures, multiple     2015- Rt. femur, hx, left elbow, rt. shoulder  . Cancer     skin cancer "basal cell", Right  kidney cancer    Past Surgical History  Procedure Laterality Date  . Abdominal hysterectomy    . Joint replacement Left     LTHA '07  . Cataracts Bilateral   . Total hip arthroplasty Right 08/04/2014    Procedure: RIGHT TOTAL HIP ARTHROPLASTY ANTERIOR APPROACH;  Surgeon: [name], MD;  Location: [facility];  Service: Orthopedics;  Laterality: Right;    Prior to Admission medications   Medication Sig Start Date End Date Taking? Authorizing Provider  acetaminophen (TYLENOL) 325 MG tablet Take 2 tablets (650 mg total) by mouth every 6 (six) hours as needed for mild pain (or Fever >/= 101). 08/06/14  Yes [name], PA-C  amoxicillin (AMOXIL) 500 MG capsule Take 4 capsules by mouth as directed. 1 hour before dental appointment 04/13/15  Yes Historical Provider, MD  aspirin 81 MG tablet Take 81 mg by mouth daily.   Yes Historical Provider, MD  atorvastatin (LIPITOR) 10 MG tablet Take 10 mg by mouth every morning.   Yes Historical Provider, MD  Azelastine HCl (ASTEPRO) 0.15 % SOLN Place 2 sprays into the nose at bedtime as needed (post nasal drip).    Yes Historical Provider, MD  cholecalciferol (VITAMIN D) 1000 UNITS tablet Take 1,000 Units by mouth daily.   Yes Historical Provider, MD   GRAPE SEED EXTRACT PO Take 1 tablet by mouth 2 (two) times daily.   Yes Historical Provider, MD  travoprost, benzalkonium, (TRAVATAN) 0.004 % ophthalmic solution Place 1 drop into both eyes at bedtime.   Yes Historical Provider, MD  valsartan (DIOVAN) 320 MG tablet Take 320 mg by mouth every evening.   Yes Historical Provider, MD  bisacodyl (DULCOLAX) 10 MG suppository Place 1 suppository (10 mg total) rectally daily as needed for moderate constipation. Patient not taking: Reported on 04/28/2015 08/06/14   [name], PA-C    Physical Examination: General appearance - alert, well appearing, and in no distress Mental status - alert, oriented to person, place, and time Chest - clear to auscultation, no wheezes, rales or rhonchi, symmetric air entry Heart - normal rate, regular rhythm, normal S1, S2, no murmurs, rubs, clicks or gallops Abdomen - soft, nontender, nondistended, no masses or organomegaly Neurological - alert, oriented, normal speech, no focal findings or movement disorder noted  A right hip exam was performed. GENERAL: no acute distress SWELLING: none WARMTH: no warmth TENDERNESS: mild and maximal at greater trochanter ROM: diminished in all ranges STRENGTH: normal GAIT: antalgic  ASSESSMENT:Right hip heterotopic bone  Plan- Excision heterotopic bone right hip. Discussed procedure, risks and potential complications and she elects to proceed  Dione Plover. Deara Bober, MD    05/08/2015, 10:30 PM

## 2015-05-09 ENCOUNTER — Encounter (HOSPITAL_COMMUNITY): Payer: Self-pay | Admitting: *Deleted

## 2015-05-09 ENCOUNTER — Encounter (HOSPITAL_COMMUNITY)
Admission: RE | Disposition: A | Discharge status: Home / Self Care | Payer: Self-pay | Source: Ambulatory Visit | Attending: Orthopedic Surgery

## 2015-05-09 ENCOUNTER — Ambulatory Visit (HOSPITAL_COMMUNITY): Payer: Medicare Other | Admitting: Certified Registered Nurse Anesthetist

## 2015-05-09 ENCOUNTER — Observation Stay (HOSPITAL_COMMUNITY)
Admission: RE | Admit: 2015-05-09 | Discharge: 2015-05-10 | Disposition: A | Discharge status: Home / Self Care | Payer: Medicare Other | Source: Ambulatory Visit | Attending: Orthopedic Surgery | Admitting: Orthopedic Surgery

## 2015-05-09 DIAGNOSIS — Z96641 Presence of right artificial hip joint: Secondary | ICD-10-CM | POA: Insufficient documentation

## 2015-05-09 DIAGNOSIS — M898X8 Other specified disorders of bone, other site: Secondary | ICD-10-CM | POA: Insufficient documentation

## 2015-05-09 DIAGNOSIS — M899 Disorder of bone, unspecified: Secondary | ICD-10-CM | POA: Diagnosis not present

## 2015-05-09 DIAGNOSIS — M160 Bilateral primary osteoarthritis of hip: Secondary | ICD-10-CM | POA: Insufficient documentation

## 2015-05-09 DIAGNOSIS — E780 Pure hypercholesterolemia: Secondary | ICD-10-CM | POA: Insufficient documentation

## 2015-05-09 DIAGNOSIS — M6158 Other ossification of muscle, other site: Secondary | ICD-10-CM | POA: Diagnosis not present

## 2015-05-09 DIAGNOSIS — M1990 Unspecified osteoarthritis, unspecified site: Secondary | ICD-10-CM | POA: Diagnosis not present

## 2015-05-09 DIAGNOSIS — Z7982 Long term (current) use of aspirin: Secondary | ICD-10-CM | POA: Insufficient documentation

## 2015-05-09 DIAGNOSIS — N189 Chronic kidney disease, unspecified: Secondary | ICD-10-CM | POA: Diagnosis not present

## 2015-05-09 DIAGNOSIS — Z85528 Personal history of other malignant neoplasm of kidney: Secondary | ICD-10-CM | POA: Diagnosis not present

## 2015-05-09 DIAGNOSIS — I129 Hypertensive chronic kidney disease with stage 1 through stage 4 chronic kidney disease, or unspecified chronic kidney disease: Secondary | ICD-10-CM | POA: Diagnosis not present

## 2015-05-09 DIAGNOSIS — M898X9 Other specified disorders of bone, unspecified site: Secondary | ICD-10-CM

## 2015-05-09 DIAGNOSIS — Z87891 Personal history of nicotine dependence: Secondary | ICD-10-CM | POA: Insufficient documentation

## 2015-05-09 DIAGNOSIS — M61561 Other ossification of muscle, right lower leg: Principal | ICD-10-CM | POA: Insufficient documentation

## 2015-05-09 DIAGNOSIS — H4089 Other specified glaucoma: Secondary | ICD-10-CM | POA: Insufficient documentation

## 2015-05-09 DIAGNOSIS — Z85828 Personal history of other malignant neoplasm of skin: Secondary | ICD-10-CM | POA: Diagnosis not present

## 2015-05-09 HISTORY — PX: MASS EXCISION: SHX2000

## 2015-05-09 HISTORY — DX: Nausea with vomiting, unspecified: R11.2

## 2015-05-09 HISTORY — DX: Other specified postprocedural states: Z98.890

## 2015-05-09 HISTORY — DX: Chronic kidney disease, unspecified: N18.9

## 2015-05-09 SURGERY — EXCISION MASS
Anesthesia: Spinal | Site: Hip | Laterality: Right

## 2015-05-09 MED ORDER — BUPIVACAINE HCL (PF) 0.25 % IJ SOLN
INTRAMUSCULAR | Status: AC
  Filled 2015-05-09: qty 30

## 2015-05-09 MED ORDER — ASPIRIN EC 81 MG PO TBEC
81.0000 mg | DELAYED_RELEASE_TABLET | Freq: Every day | ORAL | Status: DC
  Administered 2015-05-10: 81 mg via ORAL
  Filled 2015-05-09: qty 1

## 2015-05-09 MED ORDER — PROPOFOL 10 MG/ML IV BOLUS
INTRAVENOUS | Status: DC | PRN
  Administered 2015-05-09: 20 mg via INTRAVENOUS

## 2015-05-09 MED ORDER — HYDRALAZINE HCL 20 MG/ML IJ SOLN
INTRAMUSCULAR | Status: AC
  Administered 2015-05-09: 5 mg
  Filled 2015-05-09: qty 1

## 2015-05-09 MED ORDER — ONDANSETRON HCL 4 MG/2ML IJ SOLN
INTRAMUSCULAR | Status: AC
  Filled 2015-05-09: qty 2

## 2015-05-09 MED ORDER — ENOXAPARIN SODIUM 40 MG/0.4ML ~~LOC~~ SOLN
40.0000 mg | SUBCUTANEOUS | Status: DC
  Administered 2015-05-10: 40 mg via SUBCUTANEOUS
  Filled 2015-05-09 (×2): qty 0.4

## 2015-05-09 MED ORDER — ONDANSETRON HCL 4 MG PO TABS
4.0000 mg | ORAL_TABLET | Freq: Four times a day (QID) | ORAL | Status: DC | PRN
  Administered 2015-05-10: 4 mg via ORAL
  Filled 2015-05-09 (×2): qty 1

## 2015-05-09 MED ORDER — METHOCARBAMOL 1000 MG/10ML IJ SOLN
500.0000 mg | Freq: Four times a day (QID) | INTRAVENOUS | Status: DC | PRN
  Administered 2015-05-09: 500 mg via INTRAVENOUS
  Filled 2015-05-09 (×2): qty 5

## 2015-05-09 MED ORDER — TRAVOPROST 0.004 % OP SOLN: 1.0000 [drp] | Freq: Every day | OPHTHALMIC | Status: DC

## 2015-05-09 MED ORDER — ONDANSETRON HCL 4 MG/2ML IJ SOLN
INTRAMUSCULAR | Status: AC
  Administered 2015-05-09: 2 mg
  Filled 2015-05-09: qty 2

## 2015-05-09 MED ORDER — METHOCARBAMOL 500 MG PO TABS
500.0000 mg | ORAL_TABLET | Freq: Four times a day (QID) | ORAL | Status: DC | PRN
  Administered 2015-05-10: 500 mg via ORAL
  Filled 2015-05-09: qty 1

## 2015-05-09 MED ORDER — FENTANYL CITRATE (PF) 100 MCG/2ML IJ SOLN
25.0000 ug | INTRAMUSCULAR | Status: DC | PRN
  Administered 2015-05-09 (×3): 50 ug via INTRAVENOUS

## 2015-05-09 MED ORDER — FENTANYL CITRATE (PF) 100 MCG/2ML IJ SOLN
INTRAMUSCULAR | Status: AC
  Filled 2015-05-09: qty 2

## 2015-05-09 MED ORDER — ACETAMINOPHEN 325 MG PO TABS: 650.0000 mg | ORAL_TABLET | Freq: Four times a day (QID) | ORAL | Status: DC | PRN

## 2015-05-09 MED ORDER — TRAMADOL HCL 50 MG PO TABS: 50.0000 mg | ORAL_TABLET | Freq: Four times a day (QID) | ORAL | Status: DC | PRN

## 2015-05-09 MED ORDER — INDOMETHACIN ER 75 MG PO CPCR
75.0000 mg | ORAL_CAPSULE | Freq: Two times a day (BID) | ORAL | Status: DC
  Administered 2015-05-10: 75 mg via ORAL
  Filled 2015-05-09 (×4): qty 1

## 2015-05-09 MED ORDER — METOCLOPRAMIDE HCL 10 MG PO TABS: 5.0000 mg | ORAL_TABLET | Freq: Three times a day (TID) | ORAL | Status: DC | PRN

## 2015-05-09 MED ORDER — LATANOPROST 0.005 % OP SOLN
1.0000 [drp] | Freq: Every day | OPHTHALMIC | Status: DC
  Administered 2015-05-09: 1 [drp] via OPHTHALMIC
  Filled 2015-05-09: qty 2.5

## 2015-05-09 MED ORDER — DEXAMETHASONE SODIUM PHOSPHATE 10 MG/ML IJ SOLN
10.0000 mg | Freq: Once | INTRAMUSCULAR | Status: AC
  Administered 2015-05-09: 10 mg via INTRAVENOUS

## 2015-05-09 MED ORDER — METOCLOPRAMIDE HCL 5 MG/ML IJ SOLN
5.0000 mg | Freq: Three times a day (TID) | INTRAMUSCULAR | Status: DC | PRN
  Administered 2015-05-09: 10 mg via INTRAVENOUS
  Filled 2015-05-09: qty 2

## 2015-05-09 MED ORDER — ACETAMINOPHEN 10 MG/ML IV SOLN
INTRAVENOUS | Status: AC
  Filled 2015-05-09: qty 100

## 2015-05-09 MED ORDER — PROPOFOL 10 MG/ML IV BOLUS
INTRAVENOUS | Status: AC
  Filled 2015-05-09: qty 20

## 2015-05-09 MED ORDER — CEFAZOLIN SODIUM-DEXTROSE 2-3 GM-% IV SOLR
2.0000 g | Freq: Four times a day (QID) | INTRAVENOUS | Status: AC
Start: 2015-05-09 — End: 2015-05-10
  Administered 2015-05-09 – 2015-05-10 (×3): 2 g via INTRAVENOUS
  Filled 2015-05-09 (×3): qty 50

## 2015-05-09 MED ORDER — HYDRALAZINE HCL 20 MG/ML IJ SOLN
3.0000 mg | Freq: Once | INTRAMUSCULAR | Status: AC
  Administered 2015-05-09: 3 mg via INTRAVENOUS

## 2015-05-09 MED ORDER — BUPIVACAINE HCL (PF) 0.25 % IJ SOLN
INTRAMUSCULAR | Status: DC | PRN
  Administered 2015-05-09: 30 mL

## 2015-05-09 MED ORDER — ACETAMINOPHEN 500 MG PO TABS
1000.0000 mg | ORAL_TABLET | Freq: Four times a day (QID) | ORAL | Status: DC
  Administered 2015-05-10 (×2): 1000 mg via ORAL
  Filled 2015-05-09 (×5): qty 2

## 2015-05-09 MED ORDER — SODIUM CHLORIDE 0.9 % IV SOLN
INTRAVENOUS | Status: DC
  Administered 2015-05-09: 20:00:00 via INTRAVENOUS

## 2015-05-09 MED ORDER — CHLORHEXIDINE GLUCONATE 4 % EX LIQD: 60.0000 mL | Freq: Once | CUTANEOUS | Status: DC

## 2015-05-09 MED ORDER — BUPIVACAINE IN DEXTROSE 0.75-8.25 % IT SOLN
INTRATHECAL | Status: DC | PRN
  Administered 2015-05-09: 1.8 mL via INTRATHECAL

## 2015-05-09 MED ORDER — ACETAMINOPHEN 650 MG RE SUPP: 650.0000 mg | Freq: Four times a day (QID) | RECTAL | Status: DC | PRN

## 2015-05-09 MED ORDER — DEXAMETHASONE SODIUM PHOSPHATE 10 MG/ML IJ SOLN
INTRAMUSCULAR | Status: AC
  Filled 2015-05-09: qty 1

## 2015-05-09 MED ORDER — LACTATED RINGERS IV SOLN
INTRAVENOUS | Status: DC
  Administered 2015-05-09: 14:00:00 via INTRAVENOUS
  Administered 2015-05-09: 1000 mL via INTRAVENOUS

## 2015-05-09 MED ORDER — OXYCODONE HCL 5 MG PO TABS
5.0000 mg | ORAL_TABLET | ORAL | Status: DC | PRN
  Administered 2015-05-10 (×4): 5 mg via ORAL
  Filled 2015-05-09 (×4): qty 1

## 2015-05-09 MED ORDER — ATORVASTATIN CALCIUM 10 MG PO TABS
10.0000 mg | ORAL_TABLET | Freq: Every morning | ORAL | Status: DC
  Administered 2015-05-10: 10 mg via ORAL
  Filled 2015-05-09: qty 1

## 2015-05-09 MED ORDER — CEFAZOLIN SODIUM-DEXTROSE 2-3 GM-% IV SOLR
2.0000 g | INTRAVENOUS | Status: AC
  Administered 2015-05-09: 2 g via INTRAVENOUS

## 2015-05-09 MED ORDER — MORPHINE SULFATE 2 MG/ML IJ SOLN: 1.0000 mg | INTRAMUSCULAR | Status: DC | PRN

## 2015-05-09 MED ORDER — ACETAMINOPHEN 10 MG/ML IV SOLN
1000.0000 mg | Freq: Once | INTRAVENOUS | Status: DC
  Filled 2015-05-09: qty 100

## 2015-05-09 MED ORDER — SODIUM CHLORIDE 0.9 % IV SOLN: INTRAVENOUS | Status: DC

## 2015-05-09 MED ORDER — SODIUM CHLORIDE 0.9 % IJ SOLN
INTRAMUSCULAR | Status: AC
  Filled 2015-05-09: qty 50

## 2015-05-09 MED ORDER — 0.9 % SODIUM CHLORIDE (POUR BTL) OPTIME
TOPICAL | Status: DC | PRN
  Administered 2015-05-09: 1000 mL

## 2015-05-09 MED ORDER — PROPOFOL INFUSION 10 MG/ML OPTIME
INTRAVENOUS | Status: DC | PRN
  Administered 2015-05-09: 75 ug/kg/min via INTRAVENOUS

## 2015-05-09 MED ORDER — CEFAZOLIN SODIUM-DEXTROSE 2-3 GM-% IV SOLR
INTRAVENOUS | Status: AC
  Filled 2015-05-09: qty 50

## 2015-05-09 MED ORDER — IRBESARTAN 300 MG PO TABS
300.0000 mg | ORAL_TABLET | Freq: Every day | ORAL | Status: DC
  Filled 2015-05-09 (×2): qty 1

## 2015-05-09 MED ORDER — ONDANSETRON HCL 4 MG/2ML IJ SOLN
4.0000 mg | Freq: Four times a day (QID) | INTRAMUSCULAR | Status: DC | PRN
  Administered 2015-05-09: 4 mg via INTRAVENOUS

## 2015-05-09 MED ORDER — ONDANSETRON HCL 4 MG/2ML IJ SOLN
INTRAMUSCULAR | Status: DC | PRN
  Administered 2015-05-09: 4 mg via INTRAVENOUS

## 2015-05-09 MED ORDER — BUPIVACAINE LIPOSOME 1.3 % IJ SUSP
20.0000 mL | Freq: Once | INTRAMUSCULAR | Status: DC
  Filled 2015-05-09: qty 20

## 2015-05-09 SURGICAL SUPPLY — 31 items
BAG SPEC THK2 15X12 ZIP CLS (MISCELLANEOUS) ×1
BAG ZIPLOCK 12X15 (MISCELLANEOUS) ×3 IMPLANT
CLOSURE WOUND 1/2 X4 (GAUZE/BANDAGES/DRESSINGS) ×1
DRAPE ORTHO SPLIT 77X108 STRL (DRAPES) ×3
DRAPE SURG ORHT 6 SPLT 77X108 (DRAPES) ×1 IMPLANT
DRAPE U-SHAPE 47X51 STRL (DRAPES) ×3 IMPLANT
DRSG MEPITEL 4X7.2 (GAUZE/BANDAGES/DRESSINGS) ×2 IMPLANT
ELECT REM PT RETURN 9FT ADLT (ELECTROSURGICAL) ×3
ELECTRODE REM PT RTRN 9FT ADLT (ELECTROSURGICAL) ×1 IMPLANT
EVACUATOR 1/8 PVC DRAIN (DRAIN) ×2 IMPLANT
GLOVE BIO SURGEON STRL SZ7.5 (GLOVE) ×3 IMPLANT
GLOVE BIO SURGEON STRL SZ8 (GLOVE) ×6 IMPLANT
GLOVE BIOGEL PI IND STRL 8 (GLOVE) ×1 IMPLANT
GLOVE BIOGEL PI INDICATOR 8 (GLOVE) ×2
GOWN STRL REUS W/TWL LRG LVL3 (GOWN DISPOSABLE) ×3 IMPLANT
GOWN STRL REUS W/TWL XL LVL3 (GOWN DISPOSABLE) ×3 IMPLANT
MANIFOLD NEPTUNE II (INSTRUMENTS) ×3 IMPLANT
NS IRRIG 1000ML POUR BTL (IV SOLUTION) ×3 IMPLANT
PACK TOTAL JOINT (CUSTOM PROCEDURE TRAY) ×3 IMPLANT
PADDING CAST COTTON 6X4 STRL (CAST SUPPLIES) ×6 IMPLANT
POSITIONER SURGICAL ARM (MISCELLANEOUS) ×3 IMPLANT
STAPLER VISISTAT 35W (STAPLE) ×3 IMPLANT
STRIP CLOSURE SKIN 1/2X4 (GAUZE/BANDAGES/DRESSINGS) ×2 IMPLANT
SUT MNCRL AB 4-0 PS2 18 (SUTURE) ×3 IMPLANT
SUT VIC AB 1 CT1 27 (SUTURE) ×9
SUT VIC AB 1 CT1 27XBRD ANTBC (SUTURE) ×3 IMPLANT
SUT VIC AB 2-0 CT1 27 (SUTURE) ×9
SUT VIC AB 2-0 CT1 TAPERPNT 27 (SUTURE) ×3 IMPLANT
TAPE STRIPS DRAPE STRL (GAUZE/BANDAGES/DRESSINGS) ×2 IMPLANT
TOWEL OR 17X26 10 PK STRL BLUE (TOWEL DISPOSABLE) ×6 IMPLANT
WATER STERILE IRR 1500ML POUR (IV SOLUTION) ×3 IMPLANT

## 2015-05-09 NOTE — Brief Op Note (Signed)
05/09/2015  3:10 PM  PATIENT:  Christy Houston  81 y.o. female  PRE-OPERATIVE DIAGNOSIS:  right hip heterotopic ossification  POST-OPERATIVE DIAGNOSIS:  right hip heterotopic ossification  PROCEDURE:  Procedure(s): EXCISION HETEROTOPIC OSSIFICATION RIGHT HIP (Right)  SURGEON:  Surgeon(s) and Role:    * Gaynelle Arabian, MD - Primary  PHYSICIAN ASSISTANT:   ASSISTANTS: [name], PA-C   ANESTHESIA:   spinal  DRAINS: (Medium) Hemovact drain(s) in the right hip with  Suction Open   LOCAL MEDICATIONS USED:  MARCAINE     COUNTS:  YES  TOURNIQUET:  * No tourniquets in log *  DICTATION: .Other Dictation: Dictation Number [id number]  PLAN OF CARE: Admit for overnight observation  PATIENT DISPOSITION:  PACU - hemodynamically stable.

## 2015-05-09 NOTE — Interval H&P Note (Signed)
History and Physical Interval Note:  05/09/2015 2:03 PM  Christy Houston  has presented today for surgery, with the diagnosis of right hip heterotopic ossification  The various methods of treatment have been discussed with the patient and family. After consideration of risks, benefits and other options for treatment, the patient has consented to  Procedure(s): [place] (Right) as a surgical intervention .  The patient's history has been reviewed, patient examined, no change in status, stable for surgery.  I have reviewed the patient's chart and labs.  Questions were answered to the patient's satisfaction.     [name]

## 2015-05-09 NOTE — Transfer of Care (Signed)
Immediate Anesthesia Transfer of Care Note  Patient: Christy Houston  Procedure(s) Performed: Procedure(s): EXCISION HETEROTOPIC OSSIFICATION RIGHT HIP (Right)  Patient Location: PACU  Anesthesia Type:Spinal  Level of Consciousness:  sedated, patient cooperative and responds to stimulation  Airway & Oxygen Therapy:Patient Spontanous Breathing and Patient connected to face mask oxgen  Post-op Assessment:  Report given to PACU RN and Post -op Vital signs reviewed and stable  Post vital signs:  Reviewed and stable  Last Vitals:  Filed Vitals:   05/09/15 1111  BP: 159/76  Pulse: 80  Temp: 36.8 C  Resp: 18    Complications: No apparent anesthesia complications

## 2015-05-09 NOTE — Anesthesia Procedure Notes (Signed)
Spinal Patient location during procedure: OR Start time: 05/09/2015 2:16 PM Staffing Anesthesiologist: Franne Grip Resident/CRNA: [place] Waver Dibiasio C Performed by: resident/CRNA  Preanesthetic Checklist Completed: patient identified, site marked, surgical consent, pre-op evaluation, timeout performed, IV checked, risks and benefits discussed and monitors and equipment checked Spinal Block Patient position: sitting Prep: Betadine and site prepped and draped Patient monitoring: heart rate, cardiac monitor, continuous pulse ox and blood pressure Approach: right paramedian Location: L3-4 Injection technique: single-shot Needle Needle gauge: 22 G Needle length: 9 cm Assessment Sensory level: T6

## 2015-05-09 NOTE — Anesthesia Preprocedure Evaluation (Signed)
Anesthesia Evaluation  Patient identified by MRN, date of birth, ID band Patient awake    Reviewed: Allergy & Precautions, NPO status , Patient's Chart, lab work & pertinent test results  History of Anesthesia Complications (+) PONV and history of anesthetic complications  Airway Mallampati: II  TM Distance: >3 FB Neck ROM: Full    Dental no notable dental hx.    Pulmonary former smoker,  breath sounds clear to auscultation  Pulmonary exam normal       Cardiovascular Exercise Tolerance: Good hypertension, Pt. on medications Normal cardiovascular examRhythm:Regular Rate:Normal     Neuro/Psych negative neurological ROS  negative psych ROS   GI/Hepatic negative GI ROS, Neg liver ROS,   Endo/Other  negative endocrine ROS  Renal/GU Renal disease  negative genitourinary   Musculoskeletal  (+) Arthritis -,   Abdominal   Peds negative pediatric ROS (+)  Hematology negative hematology ROS (+)   Anesthesia Other Findings   Reproductive/Obstetrics negative OB ROS                             Anesthesia Physical Anesthesia Plan  ASA: III  Anesthesia Plan: Spinal   Post-op Pain Management:    Induction: Intravenous  Airway Management Planned: Natural Airway  Additional Equipment:   Intra-op Plan:   Post-operative Plan:   Informed Consent: I have reviewed the patients History and Physical, chart, labs and discussed the procedure including the risks, benefits and alternatives for the proposed anesthesia with the patient or authorized representative who has indicated his/her understanding and acceptance.   Dental advisory given  Plan Discussed with: CRNA  Anesthesia Plan Comments: (Discussed risks and benefits of and differences between spinal and general. Discussed risks of spinal including headache, backache, failure, bleeding, infection, and nerve damage. Patient consents to spinal.  Questions answered. Coagulation studies and platelet count acceptable.)        Anesthesia Quick Evaluation

## 2015-05-09 NOTE — Progress Notes (Signed)
Give hydralazine 3 mg per Dr Osie Cheeks

## 2015-05-09 NOTE — Progress Notes (Signed)
Give hydralazine per Dr Delma Post

## 2015-05-10 ENCOUNTER — Encounter (HOSPITAL_COMMUNITY): Payer: Self-pay | Admitting: Orthopedic Surgery

## 2015-05-10 DIAGNOSIS — E780 Pure hypercholesterolemia: Secondary | ICD-10-CM | POA: Diagnosis not present

## 2015-05-10 DIAGNOSIS — I129 Hypertensive chronic kidney disease with stage 1 through stage 4 chronic kidney disease, or unspecified chronic kidney disease: Secondary | ICD-10-CM | POA: Diagnosis not present

## 2015-05-10 DIAGNOSIS — M61561 Other ossification of muscle, right lower leg: Secondary | ICD-10-CM | POA: Diagnosis not present

## 2015-05-10 DIAGNOSIS — N189 Chronic kidney disease, unspecified: Secondary | ICD-10-CM | POA: Diagnosis not present

## 2015-05-10 DIAGNOSIS — M25551 Pain in right hip: Secondary | ICD-10-CM | POA: Diagnosis not present

## 2015-05-10 DIAGNOSIS — M160 Bilateral primary osteoarthritis of hip: Secondary | ICD-10-CM | POA: Diagnosis not present

## 2015-05-10 DIAGNOSIS — M898X8 Other specified disorders of bone, other site: Secondary | ICD-10-CM | POA: Diagnosis not present

## 2015-05-10 MED ORDER — INDOMETHACIN ER 75 MG PO CPCR: 75.0000 mg | ORAL_CAPSULE | Freq: Two times a day (BID) | ORAL | Status: DC

## 2015-05-10 MED ORDER — OXYCODONE HCL 5 MG PO TABS: 5.0000 mg | ORAL_TABLET | ORAL | Status: DC | PRN

## 2015-05-10 MED ORDER — TRAMADOL HCL 50 MG PO TABS: 50.0000 mg | ORAL_TABLET | Freq: Four times a day (QID) | ORAL | Status: DC | PRN

## 2015-05-10 MED ORDER — METHOCARBAMOL 500 MG PO TABS: 500.0000 mg | ORAL_TABLET | Freq: Four times a day (QID) | ORAL | Status: DC | PRN

## 2015-05-10 NOTE — Care Management Note (Signed)
Case Management Note  Patient Details  Name: ABBYGAYLE HELFAND MRN: [id number] Date of Birth: [birth year]  Subjective/Objective:                   Primary Diagnosis: Right hip heterotopic ossification. Action/Plan:  [place] planning Expected Discharge Date:  05/10/15               Expected Discharge Plan:  Skilled Nursing Facility  In-House Referral:     Discharge planning Services  CM Consult  Post Acute Care Choice:    Choice offered to:     DME Arranged:    DME Agency:     HH Arranged:    [place] Agency:     Status of Service:  Completed, signed off  Medicare Important Message Given:    Date Medicare IM Given:    Medicare IM give by:    Date Additional Medicare IM Given:    Additional Medicare Important Message give by:     If discussed at [place] of Stay Meetings, dates discussed:    Additional Comments: CM notes pt to go back to [place]; [place] arranging.  No other CM needs were communicated. Dellie Catholic, RN 05/10/2015, 11:44 AM

## 2015-05-10 NOTE — Discharge Instructions (Signed)
Dr. Gaynelle Arabian [facility] [address], [place],  [address] [phone]  Discharge Instructions  [place] items at home which could result in a fall. This includes throw rugs or furniture in walking pathways.   ICE to the affected hip every three hours for 30 minutes at a time and then as needed for pain and swelling.  Continue to use ice on the hip for pain and swelling from surgery. You may notice swelling that will progress down to the foot and ankle.  This is normal after surgery.  Elevate the leg when you are not up walking on it.    Continue to use the breathing machine which will help keep your temperature down.  It is common for your temperature to cycle up and down following surgery, especially at night when you are not up moving around and exerting yourself.  The breathing machine keeps your lungs expanded and your temperature down. Sit on high chairs which makes it easier to stand.  Sit on chairs with arms. Use the chair arms to help push yourself up when arising.   Use your walker for first several days until comfortable ambulating.  DIET You may resume your previous home diet once your are discharged from the hospital.  DRESSING / WOUND CARE / SHOWERING You may shower 3 days after surgery, but keep the wounds dry during showering.  You may use an occlusive plastic wrap (Press'n Seal for example), NO SOAKING/SUBMERGING IN THE BATHTUB.  If the bandage gets wet, change with a clean dry gauze.  If the incision gets wet, pat the wound dry with a clean towel. You may start showering three days following surgery but do not submerge the incision under water.Just pat the incision dry and apply a dry gauze dressing on daily. Change the surgical dressing daily and reapply a dry dressing each time.  ACTIVITY Walk with your walker as instructed. Use walker as long as suggested by your  caregivers. Avoid periods of inactivity such as sitting longer than an hour when not asleep. This helps prevent blood clots.  You may resume a sexual relationship in one month or when given the OK by your doctor.  You may return to work once you are cleared by your doctor.  Do not drive a car for 6 weeks or until released by you surgeon.  Do not drive while taking narcotics.  WEIGHT BEARING Weight bearing as tolerated with assist device (walker, cane, etc) as needed for mobility.  POSTOPERATIVE CONSTIPATION PROTOCOL Constipation - defined medically as fewer than three stools per week and severe constipation as less than one stool per week.  One of the most common issues patients have following surgery is constipation.  Even if you have a regular bowel pattern at home, your normal regimen is likely to be disrupted due to multiple reasons following surgery.  Combination of anesthesia, postoperative narcotics, change in appetite and fluid intake all can affect your bowels.  In order to avoid complications following surgery, here are some recommendations in order to help you during your recovery period.  Colace (docusate) - Pick up an over-the-counter form of Colace or another stool softener and take twice a day as long as you are requiring postoperative pain medications.  Take with a full glass of water daily.  If you experience loose stools or diarrhea, hold the colace until you stool forms back up.  If your symptoms do not get better  within 1 week or if they get worse, check with your doctor.  Dulcolax (bisacodyl) - Pick up over-the-counter and take as directed by the product packaging as needed to assist with the movement of your bowels.  Take with a full glass of water.  Use this product as needed if not relieved by Colace only.   MiraLax (polyethylene glycol) - Pick up over-the-counter to have on hand.  MiraLax is a solution that will increase the amount of water in your bowels to assist with  bowel movements.  Take as directed and can mix with a glass of water, juice, soda, coffee, or tea.  Take if you go more than two days without a movement. Do not use MiraLax more than once per day. Call your doctor if you are still constipated or irregular after using this medication for 7 days in a row.  If you continue to have problems with postoperative constipation, please contact the office for further assistance and recommendations.  If you experience "the worst abdominal pain ever" or develop nausea or vomiting, please contact the office immediatly for further recommendations for treatment.  ITCHING  If you experience itching with your medications, try taking only a single pain pill, or even half a pain pill at a time.  You can also use Benadryl over the counter for itching or also to help with sleep.   TED HOSE STOCKINGS Wear the elastic stockings on both legs for three weeks following surgery during the day but you may remove then at night for sleeping.  MEDICATIONS See your medication summary on the After Visit Summary that the nursing staff will review with you prior to discharge.  You may have some home medications which will be placed on hold until you complete the course of blood thinner medication.  It is important for you to complete the blood thinner medication as prescribed by your surgeon.  Continue your approved medications as instructed at time of discharge.  PRECAUTIONS If you experience chest pain or shortness of breath - call 911 immediately for transfer to the hospital emergency department.  If you develop a fever greater that 101 F, purulent drainage from wound, increased redness or drainage from wound, foul odor from the wound/dressing, or calf pain - CONTACT YOUR SURGEON.                                                   FOLLOW-UP APPOINTMENTS Make sure you keep all of your appointments after your operation with your surgeon and caregivers. You should call the office  at the above phone number and make an appointment for approximately two weeks after the date of your surgery or on the date instructed by your surgeon outlined in the "After Visit Summary".   Pick up stool softner and laxative for home use following surgery while on pain medications. Do not submerge incision under water. Please use good hand washing techniques while changing dressing each day. May shower starting three days after surgery. Please use a clean towel to pat the incision dry following showers. Continue to use ice for pain and swelling after surgery. Do not use any lotions or creams on the incision until instructed by your surgeon.

## 2015-05-10 NOTE — Anesthesia Postprocedure Evaluation (Addendum)
  Anesthesia Post-op Note  Patient: Christy Houston  Procedure(s) Performed: Procedure(s) (LRB): EXCISION HETEROTOPIC OSSIFICATION RIGHT HIP (Right)  Patient Location: PACU  Anesthesia Type: spinal  Level of Consciousness: awake and alert   Airway and Oxygen Therapy: Patient Spontanous Breathing  Post-op Pain: mild  Post-op Assessment: Post-op Vital signs reviewed, Patient's Cardiovascular Status Stable, Respiratory Function Stable, Patent Airway and No signs of Nausea or vomiting  Last Vitals:  Filed Vitals:   05/10/15 1001  BP: 125/60  Pulse: 61  Temp: 37.2 C  Resp: 16    Post-op Vital Signs: stable   Complications: No apparent anesthesia complications

## 2015-05-10 NOTE — Evaluation (Signed)
Physical Therapy Evaluation Patient Details Name: Christy Houston MRN: [id number] DOB: [birth year] Today's Date: 05/10/2015   History of Present Illness  Excision heterotopic bone from R hip; s/p R THR (2015)  Clinical Impression  Pt admitted as above and presenting with decreased R LE strength/ROM, post op pain and ambulatory balance deficits.  Pt would benefit from follow up rehab at SNF level to maximize IND and safety prior to return to IND living.    Follow Up Recommendations SNF    Equipment Recommendations  None recommended by PT    Recommendations for Other Services OT consult     Precautions / Restrictions Precautions Precautions: Fall Precaution Comments: Pt retropulsive with standing and with hx of falls at home Restrictions Weight Bearing Restrictions: No Other Position/Activity Restrictions: WBAT      Mobility  Bed Mobility Overal bed mobility: Needs Assistance Bed Mobility: Supine to Sit     Supine to sit: Min assist     General bed mobility comments: cues for sequence and use of L LE to self assist  Transfers Overall transfer level: Needs assistance Equipment used: Rolling walker (2 wheeled) Transfers: Sit to/from Stand Sit to Stand: Mod assist;+2 physical assistance;+2 safety/equipment         General transfer comment: cues for LE management and use of UEs to self assist  Ambulation/Gait Ambulation/Gait assistance: Mod assist;+2 physical assistance;+2 safety/equipment Ambulation Distance (Feet): 75 Feet (twice) Assistive device: Rolling walker (2 wheeled) Gait Pattern/deviations: Step-to pattern;Step-through pattern;Decreased step length - right;Decreased step length - left;Shuffle;Trunk flexed     General Gait Details: cues for posture, position from RW, inital sequence.  Physical assist for balance and correction of posterior drift  Stairs            Wheelchair Mobility    Modified Rankin (Stroke Patients Only)        Balance                                             Pertinent Vitals/Pain Pain Assessment: 0-10 Pain Score: 5  Pain Location: R hip Pain Descriptors / Indicators: Aching;Burning Pain Intervention(s): Limited activity within patient's tolerance;Monitored during session;Premedicated before session;Ice applied    Home Living Family/patient expects to be discharged to:: Skilled nursing facility Living Arrangements: Alone               Additional Comments: from [facility] and plans to d/c to rehab section    Prior Function Level of Independence: Independent with assistive device(s)               Hand Dominance        Extremity/Trunk Assessment   Upper Extremity Assessment: Overall WFL for tasks assessed           Lower Extremity Assessment: RLE deficits/detail RLE Deficits / Details: Hip strength 2+/5 with AAROM at hip to 90 flex and 25 abd    Cervical / Trunk Assessment: Kyphotic  Communication   Communication: HOH  Cognition Arousal/Alertness: Awake/alert Behavior During Therapy: WFL for tasks assessed/performed Overall Cognitive Status: Within Functional Limits for tasks assessed                      General Comments      Exercises Total Joint Exercises Ankle Circles/Pumps: AROM;Both;15 reps;Supine Quad Sets: AROM;Both;10 reps;Supine Heel Slides: AAROM;Right;20 reps;Supine Hip ABduction/ADduction: AAROM;Right;15 reps;Supine  Assessment/Plan    PT Assessment Patient needs continued PT services  PT Diagnosis Difficulty walking   PT Problem List Decreased strength;Decreased range of motion;Decreased activity tolerance;Decreased mobility;Decreased knowledge of use of DME;Pain;Decreased safety awareness  PT Treatment Interventions DME instruction;Gait training;Functional mobility training;Therapeutic activities;Therapeutic exercise;Patient/family education   PT Goals (Current goals can be found in the Care Plan  section) Acute Rehab PT Goals Patient Stated Goal: Rehab and then back to IND living with decreased pain PT Goal Formulation: With patient Time For Goal Achievement: 05/14/15 Potential to Achieve Goals: Good    Frequency 7X/week   Barriers to discharge        Co-evaluation               End of Session Equipment Utilized During Treatment: Gait belt Activity Tolerance: Patient tolerated treatment well Patient left: in chair;with call bell/phone within reach Nurse Communication: Mobility status         Time: 8768-1157 PT Time Calculation (min) (ACUTE ONLY): 38 min   Charges:   PT Evaluation $Initial PT Evaluation Tier I: 1 Procedure PT Treatments $Gait Training: 8-22 mins $Therapeutic Exercise: 8-22 mins   PT G Codes:        Christy Houston 24-May-2015, 12:18 PM

## 2015-05-10 NOTE — Op Note (Signed)
NAME:  [name], [name] NO.:  [id number]  MEDICAL RECORD NO.:  [id number]  LOCATION:  7829                         FACILITY:  [facility]  PHYSICIAN:  Gaynelle Arabian, M.D.    DATE OF BIRTH:  Mar 14, 1926  DATE OF PROCEDURE:  05/09/2015 DATE OF DISCHARGE:                              OPERATIVE REPORT   PREOPERATIVE DIAGNOSIS:  Right hip heterotopic ossification.  POSTOPERATIVE DIAGNOSIS:  Right hip heterotopic ossification.  PROCEDURE:  Excision of heterotopic ossification, right hip.  SURGEON:  Gaynelle Arabian, M.D.  ASSISTANT:  Alexzandrew L. Perkins, P.A.C.  ANESTHESIA:  Spinal.  ESTIMATED BLOOD LOSS:  Minimal.  DRAINS:  Hemovac x1.  COMPLICATIONS:  None.  CONDITION:  Stable to recovery.  BRIEF CLINICAL NOTE:  [name] is an 81 year old female who had a right total hip arthroplasty done 8 months ago.  She did well initially, then suddenly developed limited motion of the hip.  Radiograph showed a large amount of heterotopic bone present.  This was stable over 3 successive x-rays as it was felt that they were not performing any further.  Given her pain and stiffness, we elected to remove the heterotopic bone.  PROCEDURE IN DETAIL:  After successful administration of general anesthetic, the patient was placed supine on the operating table and her perineum was isolated from her right lower extremity with plastic drapes, and the right lower extremity was prepped and draped in the usual sterile fashion.  I utilized her previous anterior hip incision. Skin cut with a 10 blade through subcutaneous tissue to the superficial fascia.  Incision was made in the fascia and then the interval between the tensor fascia lata and the rectus femoris was identified and dissected down to the hip capsule.  The heterotopic bone was actually covering the hip capsule all the way up to the iliac crest.  I subperiosteally elevated the soft tissue from the heterotopic bone  and this was excised at piecemeal with an osteotome and rongeur.  I was then able to identify the hip joint and she had free mobility once the heterotopic bone was removed.  I inspected the wound bed and did not see any other excess bone and anatomy looked normal once we got this bone out.  Wound was then copiously irrigated with saline solution and the fascia closed over Hemovac drain with interrupted #1 Vicryl suture.  I injected 30 mL of Marcaine 0.25% into the wound bed and the subcutaneous tissues.  Subcu was then closed with interrupted 2-0 Vicryl and subcuticular running 4-0 Monocryl. Incisions were cleaned and dried and Steri-Strips and a bulky sterile dressing applied.  She was then awakened and transported to recovery in stable condition.  Note, that a surgical assistant was a medical necessity for this procedure to retract the vital neurovascular structures with careful dissection of the tissues off the heterotopic bone.     Gaynelle Arabian, M.D.     FA/MEDQ  D:  05/09/2015  T:  05/10/2015  Job:  [id number]

## 2015-05-10 NOTE — Discharge Summary (Signed)
Physician Discharge Summary   Patient ID: Christy Houston MRN: [id number] DOB/AGE: 1926/05/15 81 y.o.  Admit date: 05/09/2015 Discharge date: 05-10-2015  Primary Diagnosis:  Right hip heterotopic ossification.  Admission Diagnoses:  Past Medical History  Diagnosis Date  . Hypertension   . Hypercholesteremia   . Glaucoma     bilateral  . Arthritis     osteoarthritis hips  . Fractures, multiple     2015- Rt. femur, hx, left elbow, rt. shoulder  . Cancer     skin cancer "basal cell", Right  kidney cancer  . PONV (postoperative nausea and vomiting) sept 2015    severe nausea x several weeks after surgery  . Chronic kidney disease 2010    right nephrectomy   Discharge Diagnoses:   Principal Problem:   Heterotopic ossification of bone  Estimated body mass index is 23.25 kg/(m^2) as calculated from the following:   Height as of this encounter: _0  (1.676 m).   Weight as of this encounter: 65.318 kg (144 lb).  Procedure(s) (LRB): EXCISION HETEROTOPIC OSSIFICATION RIGHT HIP (Right)   Consults: None  HPI: Christy Houston is an 81 year old female who had a right total hip arthroplasty done 8 months ago. She did well initially, then suddenly developed limited motion of the hip. Radiograph showed a large amount of heterotopic bone present. This was stable over 3 successive x-rays as it was felt that they were not performing any further. Given her pain and stiffness, we elected to remove the heterotopic bone.  Laboratory Data: Hospital Outpatient Visit on 05/05/2015  Component Date Value Ref Range Status  . WBC 05/05/2015 6.3  4.0 - 10.5 K/uL Final  . RBC 05/05/2015 4.85  3.87 - 5.11 MIL/uL Final  . Hemoglobin 05/05/2015 13.6  12.0 - 15.0 g/dL Final  . HCT 05/05/2015 41.6  36.0 - 46.0 % Final  . MCV 05/05/2015 85.8  78.0 - 100.0 fL Final  . MCH 05/05/2015 28.0  26.0 - 34.0 pg Final  . MCHC 05/05/2015 32.7  30.0 - 36.0 g/dL Final  . RDW 05/05/2015 13.7  11.5 -  15.5 % Final  . Platelets 05/05/2015 205  150 - 400 K/uL Final  . Sodium 05/05/2015 140  135 - 145 mmol/L Final  . Potassium 05/05/2015 4.5  3.5 - 5.1 mmol/L Final  . Chloride 05/05/2015 105  101 - 111 mmol/L Final  . CO2 05/05/2015 29  22 - 32 mmol/L Final  . Glucose, Bld 05/05/2015 88  65 - 99 mg/dL Final  . BUN 05/05/2015 21* 6 - 20 mg/dL Final  . Creatinine, Ser 05/05/2015 0.73  0.44 - 1.00 mg/dL Final  . Calcium 05/05/2015 9.9  8.9 - 10.3 mg/dL Final  . GFR calc non Af Amer 05/05/2015 >60  >60 mL/min Final  . GFR calc Af Amer 05/05/2015 >60  >60 mL/min Final   Comment: (NOTE) The eGFR has been calculated using the CKD EPI equation. This calculation has not been validated in all clinical situations. eGFR's persistently <60 mL/min signify possible Chronic Kidney Disease.   . Anion gap 05/05/2015 6  5 - 15 Final     X-Rays:No results found.  EKG:No orders found for this or any previous visit.   Hospital Course: Patient was admitted to [facility] and taken to the OR and underwent the above state procedure without complications.  Patient tolerated the procedure well and was later transferred to the recovery room and then to the orthopaedic floor for postoperative care.  They were  given PO and IV analgesics for pain control following their surgery.  They were given 24 hours of postoperative antibiotics of  Anti-infectives    Start     Dose/Rate Route Frequency Ordered Stop   05/09/15 2000  ceFAZolin (ANCEF) IVPB 2 g/50 mL premix     2 g 100 mL/hr over 30 Minutes Intravenous Every 6 hours 05/09/15 1751 05/10/15 0818   05/09/15 1129  ceFAZolin (ANCEF) IVPB 2 g/50 mL premix     2 g 100 mL/hr over 30 Minutes Intravenous On call to O.R. 05/09/15 1129 05/09/15 1418     and started on DVT prophylaxis in the form of Lovenox. Patient was encouraged to get up and ambulate the day after surgery.  The patient was allowed to be WBAT with therapy. Discharge planning was consulted to  help with postop disposition and equipment needs.  Patient had a good night on the evening of surgery.  They started to get up OOB with therapy on day one.   Patient was seen in rounds and was ready to go home or the skilled unit at [facility] depending upon arrangements following morning ambulation.  Diet - Cardiac diet Follow up - in two and a half weeks. Call office for appointment at [phone] fr appointment on 05/26/2015. Activity - Weight bearing as tolerated to the surgical leg.  Walker for first several days until comfortable ambulating. May start showering three days following surgery but do not submerge incision under water. Continue to use ice for pain and swelling from surgery.  Resume Baby Aspirin 81 mg daily at home. Please use walker for the first couple of days until comfortable ambulating.  May start changing dressing tomorrow with dry gauze and tape.  Disposition - Home versus Rehab Unit at [facility] Upon Discharge - Good D/C Meds - See DC Summary DVT Prophylaxis - Aspirin   Discharge Instructions    Call MD / Call 911    Complete by:  As directed   If you experience chest pain or shortness of breath, CALL 911 and be transported to the hospital emergency room.  If you develope a fever above 101 F, pus (white drainage) or increased drainage or redness at the wound, or calf pain, call your surgeon's office.     Change dressing    Complete by:  As directed   May start changing dressing tomorrow, Wednesday 05/11/2015.  You may change your dressing dressing daily with sterile 4 x 4 inch gauze dressing and paper tape.  Do not submerge the incision under water.     Constipation Prevention    Complete by:  As directed   Drink plenty of fluids.  Prune juice may be helpful.  You may use a stool softener, such as Colace (over the counter) 100 mg twice a day.  Use MiraLax (over the counter) for constipation as needed.     Diet - low sodium heart healthy    Complete by:  As  directed      Discharge instructions    Complete by:  As directed   Pick up stool softner and laxative for home use following surgery while on pain medications. Do not submerge incision under water. Please use good hand washing techniques while changing dressing each day. May shower starting three days after surgery. Please use a clean towel to pat the incision dry following showers. Continue to use ice for pain and swelling after surgery. Do not use any lotions or creams on the incision until instructed  by your surgeon.  Resume Aspirin 81 mg daily at home.  Weight bearing as tolerated to right leg. Gait training and ambulation.  Postoperative Constipation Protocol  Constipation - defined medically as fewer than three stools per week and severe constipation as less than one stool per week.  One of the most common issues patients have following surgery is constipation.  Even if you have a regular bowel pattern at home, your normal regimen is likely to be disrupted due to multiple reasons following surgery.  Combination of anesthesia, postoperative narcotics, change in appetite and fluid intake all can affect your bowels.  In order to avoid complications following surgery, here are some recommendations in order to help you during your recovery period.  Colace (docusate) - Pick up an over-the-counter form of Colace or another stool softener and take twice a day as long as you are requiring postoperative pain medications.  Take with a full glass of water daily.  If you experience loose stools or diarrhea, hold the colace until you stool forms back up.  If your symptoms do not get better within 1 week or if they get worse, check with your doctor.  Dulcolax (bisacodyl) - Pick up over-the-counter and take as directed by the product packaging as needed to assist with the movement of your bowels.  Take with a full glass of water.  Use this product as needed if not relieved by Colace only.   MiraLax  (polyethylene glycol) - Pick up over-the-counter to have on hand.  MiraLax is a solution that will increase the amount of water in your bowels to assist with bowel movements.  Take as directed and can mix with a glass of water, juice, soda, coffee, or tea.  Take if you go more than two days without a movement. Do not use MiraLax more than once per day. Call your doctor if you are still constipated or irregular after using this medication for 7 days in a row.  If you continue to have problems with postoperative constipation, please contact the office for further assistance and recommendations.  If you experience "the worst abdominal pain ever" or develop nausea or vomiting, please contact the office immediatly for further recommendations for treatment.     Do not sit on low chairs, stoools or toilet seats, as it may be difficult to get up from low surfaces    Complete by:  As directed      Driving restrictions    Complete by:  As directed   No driving until released by the physician.     Increase activity slowly as tolerated    Complete by:  As directed      Lifting restrictions    Complete by:  As directed   No lifting until released by the physician.     Patient may shower    Complete by:  As directed   You may shower without a dressing once there is no drainage.  Do not wash over the wound.  If drainage remains, do not shower until drainage stops.     TED hose    Complete by:  As directed   Use stockings (TED hose) for 3 weeks on both leg(s).  You may remove them at night for sleeping.     Weight bearing as tolerated    Complete by:  As directed   Laterality:  right  Extremity:  Lower            Medication List    TAKE  these medications        acetaminophen 325 MG tablet  Commonly known as:  TYLENOL  Take 2 tablets (650 mg total) by mouth every 6 (six) hours as needed for mild pain (or Fever >/= 101).     amoxicillin 500 MG capsule  Commonly known as:  AMOXIL  Take 4 capsules  by mouth as directed. 1 hour before dental appointment     aspirin 81 MG tablet  Take 81 mg by mouth daily.     ASTEPRO 0.15 % Soln  Generic drug:  Azelastine HCl  Place 2 sprays into the nose at bedtime as needed (post nasal drip).     atorvastatin 10 MG tablet  Commonly known as:  LIPITOR  Take 10 mg by mouth every morning.     bisacodyl 10 MG suppository  Commonly known as:  DULCOLAX  Place 1 suppository (10 mg total) rectally daily as needed for moderate constipation.     cholecalciferol 1000 UNITS tablet  Commonly known as:  VITAMIN D  Take 1,000 Units by mouth daily.     GRAPE SEED EXTRACT PO  Take 1 tablet by mouth 2 (two) times daily.     indomethacin 75 MG CR capsule  Commonly known as:  INDOCIN SR  Take 1 capsule (75 mg total) by mouth 2 (two) times daily with a meal. Take twice a day for 30 days and then discontinue.     methocarbamol 500 MG tablet  Commonly known as:  ROBAXIN  Take 1 tablet (500 mg total) by mouth every 6 (six) hours as needed for muscle spasms.     oxyCODONE 5 MG immediate release tablet  Commonly known as:  Oxy IR/ROXICODONE  Take 1-2 tablets (5-10 mg total) by mouth every 3 (three) hours as needed for moderate pain, severe pain or breakthrough pain.     traMADol 50 MG tablet  Commonly known as:  ULTRAM  Take 1-2 tablets (50-100 mg total) by mouth every 6 (six) hours as needed (mild pain).     travoprost (benzalkonium) 0.004 % ophthalmic solution  Commonly known as:  TRAVATAN  Place 1 drop into both eyes at bedtime.     valsartan 320 MG tablet  Commonly known as:  DIOVAN  Take 320 mg by mouth every evening.           Follow-up Information    Follow up with [name], MD. Schedule an appointment as soon as possible for a visit on 05/26/2015.   Specialty:  Orthopedic Surgery   Why:  Call office at [phone] to setup appointment on Thursday 7.7.2016 with Dr. Wynelle Link.   Contact information:   [address] [place] [address] [phone]       Signed: [name], PA-C Orthopaedic Surgery 05/10/2015, 9:20 AM

## 2015-05-10 NOTE — Clinical Social Work Note (Signed)
Clinical Social Work Assessment  Patient Details  Name: Christy Houston MRN: [id number] Date of Birth: 11-28-1925  Date of referral:  05/10/15               Reason for consult:  Facility Placement, Discharge Planning                Permission sought to share information with:    Permission granted to share information::     Name::        Agency::     Relationship::     Contact Information:     Housing/Transportation Living arrangements for the past 2 months:  Apartment Source of Information:  Patient Patient Interpreter Needed:  None Criminal Activity/Legal Involvement Pertinent to Current Situation/Hospitalization:  No - Comment as needed Significant Relationships:  Adult Children Lives with:  Self Do you feel safe going back to the place where you live?   (Rehab placement needed.) Need for family participation in patient care:  No (Coment)  Care giving concerns: Pt cannot manage her care at home following hospital d/c.   Social Worker assessment / plan:  Pt hospitalized on 05/09/15 for pre planned hip surgery. Pt is from [facility] ( independent living community ). CSW met with pt to assist with d/c planning. Pt has made prior arrangements to have [facility] at [facility] CSW has confirmed d/c plan with SNF. Pt / SNF are aware pt's medicare insurance will not cover rehab stay due to hospital status of Observation. Pt will use her " grace days " to cover cost of placement. Pt will be d/c to SNF this afternoon. Pt is in agreement with this d/c plan.D/C Summary has been sent to SNF for review. NSG reviewed d/c summary, scripts,avs. Scripts are included in d/c packet. [facility]  transport is needed. Pt is aware out of pocket cost may be associated with [facility] transport. Pt has notified her family of today's d/c .  Employment status:  Retired Forensic scientist:  Medicare PT Recommendations:  [place] / Referral to community resources:  [place]  Patient/Family's Response to care:  Pt feels rehab is needed.  Patient/Family's Understanding of and Emotional Response to Diagnosis, Current Treatment, and Prognosis:  Pt is motivated to work with therapy. She is looking forward to having rehab at [facility].  Emotional Assessment Appearance:  Appears younger than stated age Attitude/Demeanor/Rapport:  Other (cooperative) Affect (typically observed):  Pleasant, Accepting Orientation:  Oriented to Self, Oriented to Place, Oriented to  Time, Oriented to Situation Alcohol / Substance use:  Not Applicable Psych involvement (Current and /or in the community):  No (Comment)  Discharge Needs  Concerns to be addressed:  No discharge needs identified Readmission within the last 30 days:  No Current discharge risk:  None Barriers to Discharge:  No Barriers Identified   Saory Carriero, Randall An, LCSW 05/10/2015, 1:19 PM

## 2015-05-10 NOTE — Progress Notes (Signed)
Physical Therapy Treatment Patient Details Name: NEISHA HINGER MRN: [id number] DOB: Jun 14, 1926 Today's Date: 2015-05-23    History of Present Illness Excision heterotopic bone from R hip; s/p R THR (2015)    PT Comments    Marked  Improvement in ambulatory stability.  Follow Up Recommendations  SNF     Equipment Recommendations  None recommended by PT    Recommendations for Other Services OT consult     Precautions / Restrictions Precautions Precautions: Fall Restrictions Weight Bearing Restrictions: No Other Position/Activity Restrictions: WBAT    Mobility  Bed Mobility Overal bed mobility: Needs Assistance Bed Mobility: Sit to Supine       Sit to supine: Min assist   General bed mobility comments: cues for sequence and use of L LE to self assist  Transfers Overall transfer level: Needs assistance Equipment used: Rolling walker (2 wheeled) Transfers: Sit to/from Stand Sit to Stand: Min assist         General transfer comment: cues for LE management and use of UEs to self assist  Ambulation/Gait Ambulation/Gait assistance: Min assist Ambulation Distance (Feet): 75 Feet (75' twice and 15' into bathroom) Assistive device: Rolling walker (2 wheeled) Gait Pattern/deviations: Step-to pattern;Step-through pattern;Decreased step length - right;Decreased step length - left;Shuffle;Trunk flexed     General Gait Details: cues for posture, position from RW, inital sequence.     Stairs            Wheelchair Mobility    Modified Rankin (Stroke Patients Only)       Balance                                    Cognition Arousal/Alertness: Awake/alert Behavior During Therapy: WFL for tasks assessed/performed Overall Cognitive Status: Within Functional Limits for tasks assessed                      Exercises      General Comments        Pertinent Vitals/Pain Pain Assessment: 0-10 Pain Score: 4  Pain Location:  R hip Pain Descriptors / Indicators: Aching;Burning Pain Intervention(s): Limited activity within patient's tolerance;Monitored during session;Premedicated before session    Home Living                      Prior Function            PT Goals (current goals can now be found in the care plan section) Acute Rehab PT Goals Patient Stated Goal: Rehab and then back to IND living with decreased pain PT Goal Formulation: With patient Time For Goal Achievement: 05/14/15 Potential to Achieve Goals: Good Progress towards PT goals: Progressing toward goals    Frequency  7X/week    PT Plan Current plan remains appropriate    Co-evaluation             End of Session Equipment Utilized During Treatment: Gait belt Activity Tolerance: Patient tolerated treatment well Patient left: in bed;with call bell/phone within reach     Time: 1328-1350 PT Time Calculation (min) (ACUTE ONLY): 22 min  Charges:  $Gait Training: 8-22 mins                    G Codes:      Romelle Muldoon 05-23-2015, 1:53 PM

## 2015-05-10 NOTE — Progress Notes (Addendum)
   Subjective: 1 Day Post-Op Procedure(s) (LRB): EXCISION HETEROTOPIC OSSIFICATION RIGHT HIP (Right) Patient reports pain as mild.   Patient seen in rounds with Dr. Wynelle Link.  Briefly discussed the surgical findings with the patient. Patient is well, but has had some minor complaints of pain in the hip, requiring pain medications Patient is ready to go home following one session of therapy as long as they can accommodate the patient at the skilled area of [facility], otherwise may have to keep due to insurance reasons.  Objective: Vital signs in last 24 hours: Temp:  [97.2 F (36.2 C)-98.9 F (37.2 C)] 97.9 F (36.6 C) (06/21 0513) Pulse Rate:  [42-100] 60 (06/21 0513) Resp:  [10-19] 16 (06/21 0513) BP: (111-197)/(44-95) 113/49 mmHg (06/21 0513) SpO2:  [99 %-100 %] 99 % (06/21 0513) Weight:  [65.318 kg (144 lb)] 65.318 kg (144 lb) (06/20 1111)  Intake/Output from previous day:  Intake/Output Summary (Last 24 hours) at 05/10/15 0848 Last data filed at 05/10/15 0543  Gross per 24 hour  Intake   2400 ml  Output   1220 ml  Net   1180 ml   Labs: No results for input(s): HGB in the last 72 hours. No results for input(s): WBC, RBC, HCT, PLT in the last 72 hours. No results for input(s): NA, K, CL, CO2, BUN, CREATININE, GLUCOSE, CALCIUM in the last 72 hours. No results for input(s): LABPT, INR in the last 72 hours.  EXAM: General - Patient is Alert, Appropriate and Oriented Extremity - Neurovascular intact Sensation intact distally Dorsiflexion/Plantar flexion intact Dressing - clean, dry Motor Function - intact, moving foot and toes well on exam.  Hemovac pulled without difficulty.  Assessment/Plan: 1 Day Post-Op Procedure(s) (LRB): EXCISION HETEROTOPIC OSSIFICATION RIGHT HIP (Right) Procedure(s) (LRB): EXCISION HETEROTOPIC OSSIFICATION RIGHT HIP (Right) Past Medical History  Diagnosis Date  . Hypertension   . Hypercholesteremia   . Glaucoma     bilateral  . Arthritis       osteoarthritis hips  . Fractures, multiple     2015- Rt. femur, hx, left elbow, rt. shoulder  . Cancer     skin cancer "basal cell", Right  kidney cancer  . PONV (postoperative nausea and vomiting) sept 2015    severe nausea x several weeks after surgery  . Chronic kidney disease 2010    right nephrectomy   Principal Problem:   Heterotopic ossification of bone  Estimated body mass index is 23.25 kg/(m^2) as calculated from the following:   Height as of this encounter: 5\' 6"  (1.676 m).   Weight as of this encounter: 65.318 kg (144 lb). Up with therapy Discharge to SNF if able to transfer to the SNF re[facility] otherwise  Diet - Cardiac diet Follow up - in 2 weeks on Thursday 7.7.2016 Activity - WBAT Disposition - Skilled nursing facility Condition Upon Discharge - Good D/C Meds - See DC Summary DVT Prophylaxis - Aspirin She will also take Indocin twice a day for 30 days to help prevent reoccurrence of heterotopic bone formation.  [name], PA-C Orthopaedic Surgery 05/10/2015, 8:48 AM

## 2015-05-11 DIAGNOSIS — R2689 Other abnormalities of gait and mobility: Secondary | ICD-10-CM | POA: Diagnosis not present

## 2015-05-11 DIAGNOSIS — M25551 Pain in right hip: Secondary | ICD-10-CM | POA: Diagnosis not present

## 2015-05-11 DIAGNOSIS — K5900 Constipation, unspecified: Secondary | ICD-10-CM | POA: Diagnosis not present

## 2015-05-11 DIAGNOSIS — I10 Essential (primary) hypertension: Secondary | ICD-10-CM | POA: Diagnosis not present

## 2015-05-11 DIAGNOSIS — Z7409 Other reduced mobility: Secondary | ICD-10-CM | POA: Diagnosis not present

## 2015-05-11 DIAGNOSIS — R278 Other lack of coordination: Secondary | ICD-10-CM | POA: Diagnosis not present

## 2015-05-11 DIAGNOSIS — M6281 Muscle weakness (generalized): Secondary | ICD-10-CM | POA: Diagnosis not present

## 2015-05-11 DIAGNOSIS — L918 Other hypertrophic disorders of the skin: Secondary | ICD-10-CM | POA: Diagnosis not present

## 2015-05-12 DIAGNOSIS — R2689 Other abnormalities of gait and mobility: Secondary | ICD-10-CM | POA: Diagnosis not present

## 2015-05-12 DIAGNOSIS — I10 Essential (primary) hypertension: Secondary | ICD-10-CM | POA: Diagnosis not present

## 2015-05-12 DIAGNOSIS — D649 Anemia, unspecified: Secondary | ICD-10-CM | POA: Diagnosis not present

## 2015-05-12 DIAGNOSIS — M6281 Muscle weakness (generalized): Secondary | ICD-10-CM | POA: Diagnosis not present

## 2015-05-12 DIAGNOSIS — R278 Other lack of coordination: Secondary | ICD-10-CM | POA: Diagnosis not present

## 2015-05-12 DIAGNOSIS — E785 Hyperlipidemia, unspecified: Secondary | ICD-10-CM | POA: Diagnosis not present

## 2015-05-12 DIAGNOSIS — E559 Vitamin D deficiency, unspecified: Secondary | ICD-10-CM | POA: Diagnosis not present

## 2015-05-13 DIAGNOSIS — R2689 Other abnormalities of gait and mobility: Secondary | ICD-10-CM | POA: Diagnosis not present

## 2015-05-13 DIAGNOSIS — R278 Other lack of coordination: Secondary | ICD-10-CM | POA: Diagnosis not present

## 2015-05-13 DIAGNOSIS — M6281 Muscle weakness (generalized): Secondary | ICD-10-CM | POA: Diagnosis not present

## 2015-05-16 DIAGNOSIS — M6281 Muscle weakness (generalized): Secondary | ICD-10-CM | POA: Diagnosis not present

## 2015-05-16 DIAGNOSIS — R278 Other lack of coordination: Secondary | ICD-10-CM | POA: Diagnosis not present

## 2015-05-16 DIAGNOSIS — M25552 Pain in left hip: Secondary | ICD-10-CM | POA: Diagnosis not present

## 2015-05-16 DIAGNOSIS — R2689 Other abnormalities of gait and mobility: Secondary | ICD-10-CM | POA: Diagnosis not present

## 2015-05-17 DIAGNOSIS — R2689 Other abnormalities of gait and mobility: Secondary | ICD-10-CM | POA: Diagnosis not present

## 2015-05-17 DIAGNOSIS — R278 Other lack of coordination: Secondary | ICD-10-CM | POA: Diagnosis not present

## 2015-05-17 DIAGNOSIS — M6281 Muscle weakness (generalized): Secondary | ICD-10-CM | POA: Diagnosis not present

## 2015-05-18 DIAGNOSIS — M6281 Muscle weakness (generalized): Secondary | ICD-10-CM | POA: Diagnosis not present

## 2015-05-18 DIAGNOSIS — R2689 Other abnormalities of gait and mobility: Secondary | ICD-10-CM | POA: Diagnosis not present

## 2015-05-18 DIAGNOSIS — R278 Other lack of coordination: Secondary | ICD-10-CM | POA: Diagnosis not present

## 2015-05-19 DIAGNOSIS — D649 Anemia, unspecified: Secondary | ICD-10-CM | POA: Diagnosis not present

## 2015-05-19 DIAGNOSIS — M6281 Muscle weakness (generalized): Secondary | ICD-10-CM | POA: Diagnosis not present

## 2015-05-19 DIAGNOSIS — I10 Essential (primary) hypertension: Secondary | ICD-10-CM | POA: Diagnosis not present

## 2015-05-19 DIAGNOSIS — R278 Other lack of coordination: Secondary | ICD-10-CM | POA: Diagnosis not present

## 2015-05-19 DIAGNOSIS — R2689 Other abnormalities of gait and mobility: Secondary | ICD-10-CM | POA: Diagnosis not present

## 2015-05-20 DIAGNOSIS — R278 Other lack of coordination: Secondary | ICD-10-CM | POA: Diagnosis not present

## 2015-05-20 DIAGNOSIS — R2689 Other abnormalities of gait and mobility: Secondary | ICD-10-CM | POA: Diagnosis not present

## 2015-05-20 DIAGNOSIS — M6281 Muscle weakness (generalized): Secondary | ICD-10-CM | POA: Diagnosis not present

## 2015-05-21 DIAGNOSIS — I10 Essential (primary) hypertension: Secondary | ICD-10-CM | POA: Diagnosis not present

## 2015-05-23 DIAGNOSIS — M6281 Muscle weakness (generalized): Secondary | ICD-10-CM | POA: Diagnosis not present

## 2015-05-23 DIAGNOSIS — R278 Other lack of coordination: Secondary | ICD-10-CM | POA: Diagnosis not present

## 2015-05-23 DIAGNOSIS — R2689 Other abnormalities of gait and mobility: Secondary | ICD-10-CM | POA: Diagnosis not present

## 2015-05-24 DIAGNOSIS — R2689 Other abnormalities of gait and mobility: Secondary | ICD-10-CM | POA: Diagnosis not present

## 2015-05-24 DIAGNOSIS — R278 Other lack of coordination: Secondary | ICD-10-CM | POA: Diagnosis not present

## 2015-05-24 DIAGNOSIS — M6281 Muscle weakness (generalized): Secondary | ICD-10-CM | POA: Diagnosis not present

## 2015-05-25 DIAGNOSIS — R2689 Other abnormalities of gait and mobility: Secondary | ICD-10-CM | POA: Diagnosis not present

## 2015-05-25 DIAGNOSIS — M6281 Muscle weakness (generalized): Secondary | ICD-10-CM | POA: Diagnosis not present

## 2015-05-25 DIAGNOSIS — R278 Other lack of coordination: Secondary | ICD-10-CM | POA: Diagnosis not present

## 2015-05-26 DIAGNOSIS — Z96641 Presence of right artificial hip joint: Secondary | ICD-10-CM | POA: Diagnosis not present

## 2015-05-26 DIAGNOSIS — R278 Other lack of coordination: Secondary | ICD-10-CM | POA: Diagnosis not present

## 2015-05-26 DIAGNOSIS — Z471 Aftercare following joint replacement surgery: Secondary | ICD-10-CM | POA: Diagnosis not present

## 2015-05-26 DIAGNOSIS — R2689 Other abnormalities of gait and mobility: Secondary | ICD-10-CM | POA: Diagnosis not present

## 2015-05-26 DIAGNOSIS — M6281 Muscle weakness (generalized): Secondary | ICD-10-CM | POA: Diagnosis not present

## 2015-05-27 DIAGNOSIS — R278 Other lack of coordination: Secondary | ICD-10-CM | POA: Diagnosis not present

## 2015-05-27 DIAGNOSIS — R2689 Other abnormalities of gait and mobility: Secondary | ICD-10-CM | POA: Diagnosis not present

## 2015-05-27 DIAGNOSIS — M6281 Muscle weakness (generalized): Secondary | ICD-10-CM | POA: Diagnosis not present

## 2015-05-30 DIAGNOSIS — M6281 Muscle weakness (generalized): Secondary | ICD-10-CM | POA: Diagnosis not present

## 2015-05-30 DIAGNOSIS — I10 Essential (primary) hypertension: Secondary | ICD-10-CM | POA: Diagnosis not present

## 2015-05-30 DIAGNOSIS — R278 Other lack of coordination: Secondary | ICD-10-CM | POA: Diagnosis not present

## 2015-05-30 DIAGNOSIS — R112 Nausea with vomiting, unspecified: Secondary | ICD-10-CM | POA: Diagnosis not present

## 2015-05-30 DIAGNOSIS — R2689 Other abnormalities of gait and mobility: Secondary | ICD-10-CM | POA: Diagnosis not present

## 2015-05-30 DIAGNOSIS — S32302D Unspecified fracture of left ilium, subsequent encounter for fracture with routine healing: Secondary | ICD-10-CM | POA: Diagnosis not present

## 2015-05-30 DIAGNOSIS — R0981 Nasal congestion: Secondary | ICD-10-CM | POA: Diagnosis not present

## 2015-05-31 DIAGNOSIS — M6281 Muscle weakness (generalized): Secondary | ICD-10-CM | POA: Diagnosis not present

## 2015-05-31 DIAGNOSIS — R2689 Other abnormalities of gait and mobility: Secondary | ICD-10-CM | POA: Diagnosis not present

## 2015-05-31 DIAGNOSIS — R112 Nausea with vomiting, unspecified: Secondary | ICD-10-CM | POA: Diagnosis not present

## 2015-05-31 DIAGNOSIS — R350 Frequency of micturition: Secondary | ICD-10-CM | POA: Diagnosis not present

## 2015-05-31 DIAGNOSIS — R278 Other lack of coordination: Secondary | ICD-10-CM | POA: Diagnosis not present

## 2015-06-01 DIAGNOSIS — R2689 Other abnormalities of gait and mobility: Secondary | ICD-10-CM | POA: Diagnosis not present

## 2015-06-01 DIAGNOSIS — R278 Other lack of coordination: Secondary | ICD-10-CM | POA: Diagnosis not present

## 2015-06-01 DIAGNOSIS — M6281 Muscle weakness (generalized): Secondary | ICD-10-CM | POA: Diagnosis not present

## 2015-06-02 DIAGNOSIS — D649 Anemia, unspecified: Secondary | ICD-10-CM | POA: Diagnosis not present

## 2015-06-02 DIAGNOSIS — R42 Dizziness and giddiness: Secondary | ICD-10-CM | POA: Diagnosis not present

## 2015-06-02 DIAGNOSIS — M6281 Muscle weakness (generalized): Secondary | ICD-10-CM | POA: Diagnosis not present

## 2015-06-02 DIAGNOSIS — I10 Essential (primary) hypertension: Secondary | ICD-10-CM | POA: Diagnosis not present

## 2015-06-02 DIAGNOSIS — R278 Other lack of coordination: Secondary | ICD-10-CM | POA: Diagnosis not present

## 2015-06-02 DIAGNOSIS — R112 Nausea with vomiting, unspecified: Secondary | ICD-10-CM | POA: Diagnosis not present

## 2015-06-02 DIAGNOSIS — H8149 Vertigo of central origin, unspecified ear: Secondary | ICD-10-CM | POA: Diagnosis not present

## 2015-06-02 DIAGNOSIS — R2689 Other abnormalities of gait and mobility: Secondary | ICD-10-CM | POA: Diagnosis not present

## 2015-06-03 DIAGNOSIS — D649 Anemia, unspecified: Secondary | ICD-10-CM | POA: Diagnosis not present

## 2015-06-06 DIAGNOSIS — R278 Other lack of coordination: Secondary | ICD-10-CM | POA: Diagnosis not present

## 2015-06-06 DIAGNOSIS — R2689 Other abnormalities of gait and mobility: Secondary | ICD-10-CM | POA: Diagnosis not present

## 2015-06-06 DIAGNOSIS — M6281 Muscle weakness (generalized): Secondary | ICD-10-CM | POA: Diagnosis not present

## 2015-06-07 DIAGNOSIS — R2689 Other abnormalities of gait and mobility: Secondary | ICD-10-CM | POA: Diagnosis not present

## 2015-06-07 DIAGNOSIS — M6281 Muscle weakness (generalized): Secondary | ICD-10-CM | POA: Diagnosis not present

## 2015-06-07 DIAGNOSIS — R278 Other lack of coordination: Secondary | ICD-10-CM | POA: Diagnosis not present

## 2015-06-08 DIAGNOSIS — R278 Other lack of coordination: Secondary | ICD-10-CM | POA: Diagnosis not present

## 2015-06-08 DIAGNOSIS — M6281 Muscle weakness (generalized): Secondary | ICD-10-CM | POA: Diagnosis not present

## 2015-06-08 DIAGNOSIS — R2689 Other abnormalities of gait and mobility: Secondary | ICD-10-CM | POA: Diagnosis not present

## 2015-06-09 DIAGNOSIS — M6281 Muscle weakness (generalized): Secondary | ICD-10-CM | POA: Diagnosis not present

## 2015-06-09 DIAGNOSIS — R2689 Other abnormalities of gait and mobility: Secondary | ICD-10-CM | POA: Diagnosis not present

## 2015-06-09 DIAGNOSIS — R278 Other lack of coordination: Secondary | ICD-10-CM | POA: Diagnosis not present

## 2015-06-10 DIAGNOSIS — M6281 Muscle weakness (generalized): Secondary | ICD-10-CM | POA: Diagnosis not present

## 2015-06-10 DIAGNOSIS — R2689 Other abnormalities of gait and mobility: Secondary | ICD-10-CM | POA: Diagnosis not present

## 2015-06-10 DIAGNOSIS — R278 Other lack of coordination: Secondary | ICD-10-CM | POA: Diagnosis not present

## 2015-06-23 DIAGNOSIS — Z471 Aftercare following joint replacement surgery: Secondary | ICD-10-CM | POA: Diagnosis not present

## 2015-06-23 DIAGNOSIS — Z96641 Presence of right artificial hip joint: Secondary | ICD-10-CM | POA: Diagnosis not present

## 2015-06-24 DIAGNOSIS — B351 Tinea unguium: Secondary | ICD-10-CM | POA: Diagnosis not present

## 2015-06-24 DIAGNOSIS — M79675 Pain in left toe(s): Secondary | ICD-10-CM | POA: Diagnosis not present

## 2015-06-24 DIAGNOSIS — M79674 Pain in right toe(s): Secondary | ICD-10-CM | POA: Diagnosis not present

## 2015-07-04 DIAGNOSIS — Z23 Encounter for immunization: Secondary | ICD-10-CM | POA: Diagnosis not present

## 2015-07-04 DIAGNOSIS — R05 Cough: Secondary | ICD-10-CM | POA: Diagnosis not present

## 2015-07-04 DIAGNOSIS — Z6823 Body mass index (BMI) 23.0-23.9, adult: Secondary | ICD-10-CM | POA: Diagnosis not present

## 2015-07-04 DIAGNOSIS — Z9181 History of falling: Secondary | ICD-10-CM | POA: Diagnosis not present

## 2015-07-04 DIAGNOSIS — Z1389 Encounter for screening for other disorder: Secondary | ICD-10-CM | POA: Diagnosis not present

## 2015-07-04 DIAGNOSIS — L719 Rosacea, unspecified: Secondary | ICD-10-CM | POA: Diagnosis not present

## 2015-07-04 DIAGNOSIS — Z1231 Encounter for screening mammogram for malignant neoplasm of breast: Secondary | ICD-10-CM | POA: Diagnosis not present

## 2015-07-04 DIAGNOSIS — E785 Hyperlipidemia, unspecified: Secondary | ICD-10-CM | POA: Diagnosis not present

## 2015-07-04 DIAGNOSIS — I10 Essential (primary) hypertension: Secondary | ICD-10-CM | POA: Diagnosis not present

## 2015-07-04 DIAGNOSIS — J449 Chronic obstructive pulmonary disease, unspecified: Secondary | ICD-10-CM | POA: Diagnosis not present

## 2015-07-04 DIAGNOSIS — Z0000 Encounter for general adult medical examination without abnormal findings: Secondary | ICD-10-CM | POA: Diagnosis not present

## 2015-07-04 DIAGNOSIS — C641 Malignant neoplasm of right kidney, except renal pelvis: Secondary | ICD-10-CM | POA: Diagnosis not present

## 2015-07-04 DIAGNOSIS — E559 Vitamin D deficiency, unspecified: Secondary | ICD-10-CM | POA: Diagnosis not present

## 2015-07-06 DIAGNOSIS — R2689 Other abnormalities of gait and mobility: Secondary | ICD-10-CM | POA: Diagnosis not present

## 2015-07-06 DIAGNOSIS — M6281 Muscle weakness (generalized): Secondary | ICD-10-CM | POA: Diagnosis not present

## 2015-07-08 DIAGNOSIS — M6281 Muscle weakness (generalized): Secondary | ICD-10-CM | POA: Diagnosis not present

## 2015-07-08 DIAGNOSIS — R2689 Other abnormalities of gait and mobility: Secondary | ICD-10-CM | POA: Diagnosis not present

## 2015-07-11 DIAGNOSIS — N3090 Cystitis, unspecified without hematuria: Secondary | ICD-10-CM | POA: Diagnosis not present

## 2015-07-11 DIAGNOSIS — M6281 Muscle weakness (generalized): Secondary | ICD-10-CM | POA: Diagnosis not present

## 2015-07-11 DIAGNOSIS — R2689 Other abnormalities of gait and mobility: Secondary | ICD-10-CM | POA: Diagnosis not present

## 2015-07-11 DIAGNOSIS — D4101 Neoplasm of uncertain behavior of right kidney: Secondary | ICD-10-CM | POA: Diagnosis not present

## 2015-07-13 DIAGNOSIS — R2689 Other abnormalities of gait and mobility: Secondary | ICD-10-CM | POA: Diagnosis not present

## 2015-07-13 DIAGNOSIS — M6281 Muscle weakness (generalized): Secondary | ICD-10-CM | POA: Diagnosis not present

## 2015-07-20 DIAGNOSIS — M6281 Muscle weakness (generalized): Secondary | ICD-10-CM | POA: Diagnosis not present

## 2015-07-20 DIAGNOSIS — R2689 Other abnormalities of gait and mobility: Secondary | ICD-10-CM | POA: Diagnosis not present

## 2015-07-25 DIAGNOSIS — M6281 Muscle weakness (generalized): Secondary | ICD-10-CM | POA: Diagnosis not present

## 2015-07-25 DIAGNOSIS — R2689 Other abnormalities of gait and mobility: Secondary | ICD-10-CM | POA: Diagnosis not present

## 2015-07-27 DIAGNOSIS — M6281 Muscle weakness (generalized): Secondary | ICD-10-CM | POA: Diagnosis not present

## 2015-07-27 DIAGNOSIS — R2689 Other abnormalities of gait and mobility: Secondary | ICD-10-CM | POA: Diagnosis not present

## 2015-08-01 DIAGNOSIS — R2689 Other abnormalities of gait and mobility: Secondary | ICD-10-CM | POA: Diagnosis not present

## 2015-08-01 DIAGNOSIS — M6281 Muscle weakness (generalized): Secondary | ICD-10-CM | POA: Diagnosis not present

## 2015-08-03 DIAGNOSIS — M6281 Muscle weakness (generalized): Secondary | ICD-10-CM | POA: Diagnosis not present

## 2015-08-03 DIAGNOSIS — R2689 Other abnormalities of gait and mobility: Secondary | ICD-10-CM | POA: Diagnosis not present

## 2015-08-05 DIAGNOSIS — Z471 Aftercare following joint replacement surgery: Secondary | ICD-10-CM | POA: Diagnosis not present

## 2015-08-05 DIAGNOSIS — Z96641 Presence of right artificial hip joint: Secondary | ICD-10-CM | POA: Diagnosis not present

## 2015-08-08 DIAGNOSIS — R2689 Other abnormalities of gait and mobility: Secondary | ICD-10-CM | POA: Diagnosis not present

## 2015-08-08 DIAGNOSIS — M6281 Muscle weakness (generalized): Secondary | ICD-10-CM | POA: Diagnosis not present

## 2015-08-10 DIAGNOSIS — Z83511 Family history of glaucoma: Secondary | ICD-10-CM | POA: Diagnosis not present

## 2015-08-10 DIAGNOSIS — H4011X2 Primary open-angle glaucoma, moderate stage: Secondary | ICD-10-CM | POA: Diagnosis not present

## 2015-08-10 DIAGNOSIS — H5203 Hypermetropia, bilateral: Secondary | ICD-10-CM | POA: Diagnosis not present

## 2015-08-11 DIAGNOSIS — M6281 Muscle weakness (generalized): Secondary | ICD-10-CM | POA: Diagnosis not present

## 2015-08-11 DIAGNOSIS — R2689 Other abnormalities of gait and mobility: Secondary | ICD-10-CM | POA: Diagnosis not present

## 2015-08-12 DIAGNOSIS — M6281 Muscle weakness (generalized): Secondary | ICD-10-CM | POA: Diagnosis not present

## 2015-08-12 DIAGNOSIS — R2689 Other abnormalities of gait and mobility: Secondary | ICD-10-CM | POA: Diagnosis not present

## 2015-08-15 DIAGNOSIS — M6281 Muscle weakness (generalized): Secondary | ICD-10-CM | POA: Diagnosis not present

## 2015-08-15 DIAGNOSIS — R2689 Other abnormalities of gait and mobility: Secondary | ICD-10-CM | POA: Diagnosis not present

## 2015-08-17 DIAGNOSIS — M6281 Muscle weakness (generalized): Secondary | ICD-10-CM | POA: Diagnosis not present

## 2015-08-17 DIAGNOSIS — R2689 Other abnormalities of gait and mobility: Secondary | ICD-10-CM | POA: Diagnosis not present

## 2015-08-19 ENCOUNTER — Encounter: Payer: Self-pay | Admitting: Internal Medicine

## 2015-08-19 DIAGNOSIS — M6281 Muscle weakness (generalized): Secondary | ICD-10-CM | POA: Diagnosis not present

## 2015-08-19 DIAGNOSIS — R2689 Other abnormalities of gait and mobility: Secondary | ICD-10-CM | POA: Diagnosis not present

## 2015-08-22 DIAGNOSIS — R2689 Other abnormalities of gait and mobility: Secondary | ICD-10-CM | POA: Diagnosis not present

## 2015-08-22 DIAGNOSIS — M6281 Muscle weakness (generalized): Secondary | ICD-10-CM | POA: Diagnosis not present

## 2015-08-24 DIAGNOSIS — R2689 Other abnormalities of gait and mobility: Secondary | ICD-10-CM | POA: Diagnosis not present

## 2015-08-24 DIAGNOSIS — M6281 Muscle weakness (generalized): Secondary | ICD-10-CM | POA: Diagnosis not present

## 2015-08-26 DIAGNOSIS — M6281 Muscle weakness (generalized): Secondary | ICD-10-CM | POA: Diagnosis not present

## 2015-08-26 DIAGNOSIS — R2689 Other abnormalities of gait and mobility: Secondary | ICD-10-CM | POA: Diagnosis not present

## 2015-08-29 DIAGNOSIS — M6281 Muscle weakness (generalized): Secondary | ICD-10-CM | POA: Diagnosis not present

## 2015-08-29 DIAGNOSIS — R2689 Other abnormalities of gait and mobility: Secondary | ICD-10-CM | POA: Diagnosis not present

## 2015-08-31 DIAGNOSIS — M6281 Muscle weakness (generalized): Secondary | ICD-10-CM | POA: Diagnosis not present

## 2015-08-31 DIAGNOSIS — R2689 Other abnormalities of gait and mobility: Secondary | ICD-10-CM | POA: Diagnosis not present

## 2015-09-01 DIAGNOSIS — R2689 Other abnormalities of gait and mobility: Secondary | ICD-10-CM | POA: Diagnosis not present

## 2015-09-01 DIAGNOSIS — M6281 Muscle weakness (generalized): Secondary | ICD-10-CM | POA: Diagnosis not present

## 2015-09-02 DIAGNOSIS — M79674 Pain in right toe(s): Secondary | ICD-10-CM | POA: Diagnosis not present

## 2015-09-02 DIAGNOSIS — B351 Tinea unguium: Secondary | ICD-10-CM | POA: Diagnosis not present

## 2015-09-02 DIAGNOSIS — M79675 Pain in left toe(s): Secondary | ICD-10-CM | POA: Diagnosis not present

## 2015-09-06 DIAGNOSIS — R2689 Other abnormalities of gait and mobility: Secondary | ICD-10-CM | POA: Diagnosis not present

## 2015-09-06 DIAGNOSIS — M6281 Muscle weakness (generalized): Secondary | ICD-10-CM | POA: Diagnosis not present

## 2015-09-08 DIAGNOSIS — M6281 Muscle weakness (generalized): Secondary | ICD-10-CM | POA: Diagnosis not present

## 2015-09-08 DIAGNOSIS — R2689 Other abnormalities of gait and mobility: Secondary | ICD-10-CM | POA: Diagnosis not present

## 2015-09-09 DIAGNOSIS — M6281 Muscle weakness (generalized): Secondary | ICD-10-CM | POA: Diagnosis not present

## 2015-09-09 DIAGNOSIS — R2689 Other abnormalities of gait and mobility: Secondary | ICD-10-CM | POA: Diagnosis not present

## 2015-09-12 DIAGNOSIS — M6281 Muscle weakness (generalized): Secondary | ICD-10-CM | POA: Diagnosis not present

## 2015-09-12 DIAGNOSIS — R2689 Other abnormalities of gait and mobility: Secondary | ICD-10-CM | POA: Diagnosis not present

## 2015-09-14 DIAGNOSIS — R2689 Other abnormalities of gait and mobility: Secondary | ICD-10-CM | POA: Diagnosis not present

## 2015-09-14 DIAGNOSIS — M6281 Muscle weakness (generalized): Secondary | ICD-10-CM | POA: Diagnosis not present

## 2015-09-21 DIAGNOSIS — M6281 Muscle weakness (generalized): Secondary | ICD-10-CM | POA: Diagnosis not present

## 2015-09-21 DIAGNOSIS — R2689 Other abnormalities of gait and mobility: Secondary | ICD-10-CM | POA: Diagnosis not present

## 2015-09-22 DIAGNOSIS — M6281 Muscle weakness (generalized): Secondary | ICD-10-CM | POA: Diagnosis not present

## 2015-09-22 DIAGNOSIS — R2689 Other abnormalities of gait and mobility: Secondary | ICD-10-CM | POA: Diagnosis not present

## 2015-09-23 DIAGNOSIS — M1712 Unilateral primary osteoarthritis, left knee: Secondary | ICD-10-CM | POA: Diagnosis not present

## 2015-09-23 DIAGNOSIS — M1711 Unilateral primary osteoarthritis, right knee: Secondary | ICD-10-CM | POA: Diagnosis not present

## 2015-09-23 DIAGNOSIS — M170 Bilateral primary osteoarthritis of knee: Secondary | ICD-10-CM | POA: Diagnosis not present

## 2015-09-27 DIAGNOSIS — R2689 Other abnormalities of gait and mobility: Secondary | ICD-10-CM | POA: Diagnosis not present

## 2015-09-27 DIAGNOSIS — M6281 Muscle weakness (generalized): Secondary | ICD-10-CM | POA: Diagnosis not present

## 2015-10-07 DIAGNOSIS — M2042 Other hammer toe(s) (acquired), left foot: Secondary | ICD-10-CM | POA: Diagnosis not present

## 2015-10-07 DIAGNOSIS — M2012 Hallux valgus (acquired), left foot: Secondary | ICD-10-CM | POA: Diagnosis not present

## 2015-10-07 DIAGNOSIS — M2011 Hallux valgus (acquired), right foot: Secondary | ICD-10-CM | POA: Diagnosis not present

## 2015-10-07 DIAGNOSIS — M2041 Other hammer toe(s) (acquired), right foot: Secondary | ICD-10-CM | POA: Diagnosis not present

## 2015-10-31 DIAGNOSIS — H5203 Hypermetropia, bilateral: Secondary | ICD-10-CM | POA: Diagnosis not present

## 2015-10-31 DIAGNOSIS — H401132 Primary open-angle glaucoma, bilateral, moderate stage: Secondary | ICD-10-CM | POA: Diagnosis not present

## 2015-10-31 DIAGNOSIS — Z83511 Family history of glaucoma: Secondary | ICD-10-CM | POA: Diagnosis not present

## 2015-11-04 DIAGNOSIS — M79675 Pain in left toe(s): Secondary | ICD-10-CM | POA: Diagnosis not present

## 2015-11-04 DIAGNOSIS — B351 Tinea unguium: Secondary | ICD-10-CM | POA: Diagnosis not present

## 2015-11-04 DIAGNOSIS — M79672 Pain in left foot: Secondary | ICD-10-CM | POA: Diagnosis not present

## 2015-11-11 DIAGNOSIS — M170 Bilateral primary osteoarthritis of knee: Secondary | ICD-10-CM | POA: Diagnosis not present

## 2015-11-11 DIAGNOSIS — Z471 Aftercare following joint replacement surgery: Secondary | ICD-10-CM | POA: Diagnosis not present

## 2015-11-11 DIAGNOSIS — Z96641 Presence of right artificial hip joint: Secondary | ICD-10-CM | POA: Diagnosis not present

## 2015-11-22 DIAGNOSIS — L820 Inflamed seborrheic keratosis: Secondary | ICD-10-CM | POA: Diagnosis not present

## 2015-11-22 DIAGNOSIS — L718 Other rosacea: Secondary | ICD-10-CM | POA: Diagnosis not present

## 2015-11-22 DIAGNOSIS — L988 Other specified disorders of the skin and subcutaneous tissue: Secondary | ICD-10-CM | POA: Diagnosis not present

## 2015-11-29 ENCOUNTER — Emergency Department (HOSPITAL_BASED_OUTPATIENT_CLINIC_OR_DEPARTMENT_OTHER): Payer: Medicare Other

## 2015-11-29 ENCOUNTER — Encounter (HOSPITAL_BASED_OUTPATIENT_CLINIC_OR_DEPARTMENT_OTHER): Payer: Self-pay | Admitting: Emergency Medicine

## 2015-11-29 ENCOUNTER — Emergency Department (HOSPITAL_BASED_OUTPATIENT_CLINIC_OR_DEPARTMENT_OTHER)
Admission: EM | Admit: 2015-11-29 | Discharge: 2015-11-30 | Disposition: A | Discharge status: Home / Self Care | Payer: Medicare Other | Attending: Emergency Medicine | Admitting: Emergency Medicine

## 2015-11-29 DIAGNOSIS — W228XXA Striking against or struck by other objects, initial encounter: Secondary | ICD-10-CM | POA: Insufficient documentation

## 2015-11-29 DIAGNOSIS — S59901A Unspecified injury of right elbow, initial encounter: Secondary | ICD-10-CM | POA: Insufficient documentation

## 2015-11-29 DIAGNOSIS — Y9289 Other specified places as the place of occurrence of the external cause: Secondary | ICD-10-CM | POA: Diagnosis not present

## 2015-11-29 DIAGNOSIS — E7800 Pure hypercholesterolemia, unspecified: Secondary | ICD-10-CM | POA: Insufficient documentation

## 2015-11-29 DIAGNOSIS — Z7982 Long term (current) use of aspirin: Secondary | ICD-10-CM | POA: Insufficient documentation

## 2015-11-29 DIAGNOSIS — Z87891 Personal history of nicotine dependence: Secondary | ICD-10-CM | POA: Diagnosis not present

## 2015-11-29 DIAGNOSIS — S59909A Unspecified injury of unspecified elbow, initial encounter: Secondary | ICD-10-CM | POA: Diagnosis not present

## 2015-11-29 DIAGNOSIS — I129 Hypertensive chronic kidney disease with stage 1 through stage 4 chronic kidney disease, or unspecified chronic kidney disease: Secondary | ICD-10-CM | POA: Diagnosis not present

## 2015-11-29 DIAGNOSIS — S5011XA Contusion of right forearm, initial encounter: Secondary | ICD-10-CM

## 2015-11-29 DIAGNOSIS — Z85828 Personal history of other malignant neoplasm of skin: Secondary | ICD-10-CM | POA: Insufficient documentation

## 2015-11-29 DIAGNOSIS — M25522 Pain in left elbow: Secondary | ICD-10-CM | POA: Diagnosis not present

## 2015-11-29 DIAGNOSIS — Y9389 Activity, other specified: Secondary | ICD-10-CM | POA: Diagnosis not present

## 2015-11-29 DIAGNOSIS — S4991XA Unspecified injury of right shoulder and upper arm, initial encounter: Secondary | ICD-10-CM | POA: Diagnosis present

## 2015-11-29 DIAGNOSIS — S59902A Unspecified injury of left elbow, initial encounter: Secondary | ICD-10-CM | POA: Diagnosis not present

## 2015-11-29 DIAGNOSIS — M1990 Unspecified osteoarthritis, unspecified site: Secondary | ICD-10-CM | POA: Diagnosis not present

## 2015-11-29 DIAGNOSIS — N189 Chronic kidney disease, unspecified: Secondary | ICD-10-CM | POA: Diagnosis not present

## 2015-11-29 DIAGNOSIS — Z8781 Personal history of (healed) traumatic fracture: Secondary | ICD-10-CM | POA: Insufficient documentation

## 2015-11-29 DIAGNOSIS — R600 Localized edema: Secondary | ICD-10-CM | POA: Diagnosis not present

## 2015-11-29 DIAGNOSIS — Y998 Other external cause status: Secondary | ICD-10-CM | POA: Insufficient documentation

## 2015-11-29 DIAGNOSIS — T148 Other injury of unspecified body region: Secondary | ICD-10-CM | POA: Diagnosis not present

## 2015-11-29 DIAGNOSIS — H409 Unspecified glaucoma: Secondary | ICD-10-CM | POA: Diagnosis not present

## 2015-11-29 NOTE — ED Notes (Signed)
Rounded on pt. Updated pt on reason for delay. Assured pt MD would be in to discuss the results of her xrays as soon as possible. Pt verbalized understanding and is agreeable to this plan.

## 2015-11-29 NOTE — ED Notes (Addendum)
Pt sat back into her chair 2 hrs ago and hit her right elbow on the wooden arm rest.  Pain and swelling with hematoma to right elbow.  Fracture to that elbow 2 years ago.  Pt lives in independent living at [facility].

## 2015-11-29 NOTE — ED Notes (Signed)
Patient transported to X-ray 

## 2015-11-30 DIAGNOSIS — M25522 Pain in left elbow: Secondary | ICD-10-CM | POA: Diagnosis not present

## 2015-11-30 NOTE — ED Notes (Signed)
MD at bedside. ([place])

## 2015-11-30 NOTE — ED Notes (Signed)
[facility] here for transport at this time.  

## 2015-11-30 NOTE — ED Notes (Signed)
[facility] contacted for transport back to [facility].

## 2015-11-30 NOTE — ED Provider Notes (Signed)
CSN: [id number]     Arrival date & time 11/29/15  2210 History   First MD Initiated Contact with Patient 11/30/15 0002     Chief Complaint  Patient presents with  . Elbow Injury     (Consider location/radiation/quality/duration/timing/severity/associated sxs/prior Treatment) HPI  This is an 82 year old female who was in the dining hall at her retirement center yesterday evening. When she sat down in her chair she set a little harder than she meant. Her right elbow struck the arm of the chair. She has subsequently developed a large swollen, ecchymotic mass of the proximal right forearm. There is minimal pain associated with it. There is no functional or sensory deficit. She has not on any anticoagulants other than aspirin 81 milligrams daily. She denies other injury. She has chronic edema of the lower legs.  Past Medical History  Diagnosis Date  . Hypertension   . Hypercholesteremia   . Glaucoma     bilateral  . Arthritis     osteoarthritis hips  . Fractures, multiple     2015- Rt. femur, hx, left elbow, rt. shoulder  . Cancer ([place])     skin cancer "basal cell", Right  kidney cancer  . PONV (postoperative nausea and vomiting) sept 2015    severe nausea x several weeks after surgery  . Chronic kidney disease 2010    right nephrectomy   Past Surgical History  Procedure Laterality Date  . Abdominal hysterectomy    . Joint replacement Left     LTHA '07  . Cataracts Bilateral   . Total hip arthroplasty Right 08/04/2014    Procedure: RIGHT TOTAL HIP ARTHROPLASTY ANTERIOR APPROACH;  Surgeon: [name], MD;  Location: [facility];  Service: Orthopedics;  Laterality: Right;  . Mass excision Right 05/09/2015    Procedure: EXCISION HETEROTOPIC OSSIFICATION RIGHT HIP;  Surgeon: Gaynelle Arabian, MD;  Location: [facility];  Service: Orthopedics;  Laterality: Right;   No family history on file. Social History  Substance Use Topics  . Smoking status: Former Smoker -- 1.00 packs/day    Types:  Cigarettes    Quit date: 07/31/1999  . Smokeless tobacco: Never Used     Comment: Quit  . Alcohol Use: Yes     Comment: wine rare social   OB History    No data available     Review of Systems  All other systems reviewed and are negative.  Allergies  Review of patient's allergies indicates no known allergies.  Home Medications   Prior to Admission medications   Medication Sig Start Date End Date Taking? Authorizing Provider  atorvastatin (LIPITOR) 10 MG tablet Take 10 mg by mouth every morning.   Yes Historical Provider, MD  travoprost, benzalkonium, (TRAVATAN) 0.004 % ophthalmic solution Place 1 drop into both eyes at bedtime.   Yes Historical Provider, MD  valsartan (DIOVAN) 320 MG tablet Take 320 mg by mouth every evening.   Yes Historical Provider, MD  acetaminophen (TYLENOL) 325 MG tablet Take 2 tablets (650 mg total) by mouth every 6 (six) hours as needed for mild pain (or Fever >/= 101). 08/06/14   [name], PA-C  amoxicillin (AMOXIL) 500 MG capsule Take 4 capsules by mouth as directed. 1 hour before dental appointment 04/13/15   Historical Provider, MD  aspirin 81 MG tablet Take 81 mg by mouth daily.    Historical Provider, MD  Azelastine HCl (ASTEPRO) 0.15 % SOLN Place 2 sprays into the nose at bedtime as needed (post nasal drip).  Historical Provider, MD  bisacodyl (DULCOLAX) 10 MG suppository Place 1 suppository (10 mg total) rectally daily as needed for moderate constipation. Patient not taking: Reported on 04/28/2015 08/06/14   [name], PA-C  cholecalciferol (VITAMIN D) 1000 UNITS tablet Take 1,000 Units by mouth daily.    Historical Provider, MD  GRAPE SEED EXTRACT PO Take 1 tablet by mouth 2 (two) times daily.    Historical Provider, MD  indomethacin (INDOCIN SR) 75 MG CR capsule Take 1 capsule (75 mg total) by mouth 2 (two) times daily with a meal. Take twice a day for 30 days and then discontinue. 05/10/15   [name], PA-C  methocarbamol (ROBAXIN) 500 MG  tablet Take 1 tablet (500 mg total) by mouth every 6 (six) hours as needed for muscle spasms. 05/10/15   [name], PA-C  oxyCODONE (OXY IR/ROXICODONE) 5 MG immediate release tablet Take 1-2 tablets (5-10 mg total) by mouth every 3 (three) hours as needed for moderate pain, severe pain or breakthrough pain. 05/10/15   [name], PA-C  traMADol (ULTRAM) 50 MG tablet Take 1-2 tablets (50-100 mg total) by mouth every 6 (six) hours as needed (mild pain). 05/10/15   [name], PA-C   BP 185/77 mmHg  Pulse 69  Temp(Src) 98.4 F (36.9 C) (Oral)  Resp 18  Ht 5\' 6"  (1.676 m)  Wt 145 lb (65.772 kg)  BMI 23.41 kg/m2  SpO2 100%   Physical Exam  General: Well-developed, well-nourished female in no acute distress; appearance consistent with age of record HENT: normocephalic; atraumatic Eyes: pupils equal, round and reactive to light; extraocular muscles intact; lens implants Neck: supple Heart: regular rate and rhythm Lungs: clear to auscultation bilaterally Abdomen: soft; nondistended; nontender Extremities: Arthritic changes; large, minimally tender hematoma of the proximal right forearm; full range of motion; pulses normal; +2 pitting edema of the lower legs Neurologic: Awake, alert and oriented; motor function intact in all extremities and symmetric; no facial droop Skin: Warm and dry Psychiatric: Normal mood and affect    ED Course  Procedures (including critical care time)   MDM  Nursing notes and vitals signs, including pulse oximetry, reviewed.  Summary of this visit's results, reviewed by myself:  Imaging Studies: Dg Elbow Complete Right  11/29/2015  CLINICAL DATA:  Acute right elbow pain after trauma. EXAM: RIGHT ELBOW - COMPLETE 3+ VIEW COMPARISON:  None. FINDINGS: There is no evidence of fracture, dislocation, or joint effusion. Mild degenerative changes seen involving the joints. Large dorsal soft tissue swelling is noted most consistent with posttraumatic hematoma.  IMPRESSION: Probable large posttraumatic hematoma seen dorsal to proximal radius and ulna. No definite fracture or dislocation is noted. Electronically Signed   By: Marijo Conception, M.D.   On: 11/29/2015 23:03   Dg Forearm Right  11/29/2015  CLINICAL DATA:  Fall striking right forearm against wooden arm of chair, now with swelling. EXAM: RIGHT FOREARM - 2 VIEW COMPARISON:  No prior exams. Concurrently performed elbow radiographs. FINDINGS: Cortical margins of the radius ulna appear intact. Wrist alignment is grossly maintained. Focal soft tissue prominence about the dorsal aspect of the proximal forearm. No radiopaque foreign body. IMPRESSION: Proximal forearm soft tissue prominence consistent with hematoma. No acute fracture. Electronically Signed   By: Jeb Levering M.D.   On: 11/29/2015 23:03       Shanon Rosser, MD 11/30/15 [id number]

## 2015-11-30 NOTE — ED Notes (Signed)
[facility] notified for transport back to [facility]

## 2015-12-27 DIAGNOSIS — L988 Other specified disorders of the skin and subcutaneous tissue: Secondary | ICD-10-CM | POA: Diagnosis not present

## 2015-12-27 DIAGNOSIS — L820 Inflamed seborrheic keratosis: Secondary | ICD-10-CM | POA: Diagnosis not present

## 2016-01-06 DIAGNOSIS — M79674 Pain in right toe(s): Secondary | ICD-10-CM | POA: Diagnosis not present

## 2016-01-06 DIAGNOSIS — B351 Tinea unguium: Secondary | ICD-10-CM | POA: Diagnosis not present

## 2016-01-09 DIAGNOSIS — Z6824 Body mass index (BMI) 24.0-24.9, adult: Secondary | ICD-10-CM | POA: Diagnosis not present

## 2016-01-09 DIAGNOSIS — E785 Hyperlipidemia, unspecified: Secondary | ICD-10-CM | POA: Diagnosis not present

## 2016-01-09 DIAGNOSIS — L719 Rosacea, unspecified: Secondary | ICD-10-CM | POA: Diagnosis not present

## 2016-01-09 DIAGNOSIS — C641 Malignant neoplasm of right kidney, except renal pelvis: Secondary | ICD-10-CM | POA: Diagnosis not present

## 2016-01-09 DIAGNOSIS — I10 Essential (primary) hypertension: Secondary | ICD-10-CM | POA: Diagnosis not present

## 2016-01-09 DIAGNOSIS — E559 Vitamin D deficiency, unspecified: Secondary | ICD-10-CM | POA: Diagnosis not present

## 2016-01-31 DIAGNOSIS — H401132 Primary open-angle glaucoma, bilateral, moderate stage: Secondary | ICD-10-CM | POA: Diagnosis not present

## 2016-01-31 DIAGNOSIS — H5203 Hypermetropia, bilateral: Secondary | ICD-10-CM | POA: Diagnosis not present

## 2016-01-31 DIAGNOSIS — Z83511 Family history of glaucoma: Secondary | ICD-10-CM | POA: Diagnosis not present

## 2016-02-02 DIAGNOSIS — M6281 Muscle weakness (generalized): Secondary | ICD-10-CM | POA: Diagnosis not present

## 2016-02-02 DIAGNOSIS — R2689 Other abnormalities of gait and mobility: Secondary | ICD-10-CM | POA: Diagnosis not present

## 2016-02-02 DIAGNOSIS — R278 Other lack of coordination: Secondary | ICD-10-CM | POA: Diagnosis not present

## 2016-02-03 DIAGNOSIS — R278 Other lack of coordination: Secondary | ICD-10-CM | POA: Diagnosis not present

## 2016-02-03 DIAGNOSIS — R2689 Other abnormalities of gait and mobility: Secondary | ICD-10-CM | POA: Diagnosis not present

## 2016-02-03 DIAGNOSIS — M6281 Muscle weakness (generalized): Secondary | ICD-10-CM | POA: Diagnosis not present

## 2016-02-07 DIAGNOSIS — R2689 Other abnormalities of gait and mobility: Secondary | ICD-10-CM | POA: Diagnosis not present

## 2016-02-07 DIAGNOSIS — R278 Other lack of coordination: Secondary | ICD-10-CM | POA: Diagnosis not present

## 2016-02-07 DIAGNOSIS — M6281 Muscle weakness (generalized): Secondary | ICD-10-CM | POA: Diagnosis not present

## 2016-02-14 DIAGNOSIS — M6281 Muscle weakness (generalized): Secondary | ICD-10-CM | POA: Diagnosis not present

## 2016-02-14 DIAGNOSIS — R278 Other lack of coordination: Secondary | ICD-10-CM | POA: Diagnosis not present

## 2016-02-14 DIAGNOSIS — R2689 Other abnormalities of gait and mobility: Secondary | ICD-10-CM | POA: Diagnosis not present

## 2016-02-21 DIAGNOSIS — M6281 Muscle weakness (generalized): Secondary | ICD-10-CM | POA: Diagnosis not present

## 2016-02-21 DIAGNOSIS — R278 Other lack of coordination: Secondary | ICD-10-CM | POA: Diagnosis not present

## 2016-02-21 DIAGNOSIS — R2689 Other abnormalities of gait and mobility: Secondary | ICD-10-CM | POA: Diagnosis not present

## 2016-02-28 DIAGNOSIS — R2689 Other abnormalities of gait and mobility: Secondary | ICD-10-CM | POA: Diagnosis not present

## 2016-02-28 DIAGNOSIS — M6281 Muscle weakness (generalized): Secondary | ICD-10-CM | POA: Diagnosis not present

## 2016-02-28 DIAGNOSIS — R278 Other lack of coordination: Secondary | ICD-10-CM | POA: Diagnosis not present

## 2016-02-29 DIAGNOSIS — R2689 Other abnormalities of gait and mobility: Secondary | ICD-10-CM | POA: Diagnosis not present

## 2016-02-29 DIAGNOSIS — M6281 Muscle weakness (generalized): Secondary | ICD-10-CM | POA: Diagnosis not present

## 2016-02-29 DIAGNOSIS — R278 Other lack of coordination: Secondary | ICD-10-CM | POA: Diagnosis not present

## 2016-03-01 DIAGNOSIS — M2142 Flat foot [pes planus] (acquired), left foot: Secondary | ICD-10-CM | POA: Diagnosis not present

## 2016-03-01 DIAGNOSIS — M9906 Segmental and somatic dysfunction of lower extremity: Secondary | ICD-10-CM | POA: Diagnosis not present

## 2016-03-01 DIAGNOSIS — M9903 Segmental and somatic dysfunction of lumbar region: Secondary | ICD-10-CM | POA: Diagnosis not present

## 2016-03-01 DIAGNOSIS — M4316 Spondylolisthesis, lumbar region: Secondary | ICD-10-CM | POA: Diagnosis not present

## 2016-03-01 DIAGNOSIS — S22080A Wedge compression fracture of T11-T12 vertebra, initial encounter for closed fracture: Secondary | ICD-10-CM | POA: Diagnosis not present

## 2016-03-01 DIAGNOSIS — M25552 Pain in left hip: Secondary | ICD-10-CM | POA: Diagnosis not present

## 2016-03-01 DIAGNOSIS — M9905 Segmental and somatic dysfunction of pelvic region: Secondary | ICD-10-CM | POA: Diagnosis not present

## 2016-03-01 DIAGNOSIS — M7742 Metatarsalgia, left foot: Secondary | ICD-10-CM | POA: Diagnosis not present

## 2016-03-01 DIAGNOSIS — M7741 Metatarsalgia, right foot: Secondary | ICD-10-CM | POA: Diagnosis not present

## 2016-03-01 DIAGNOSIS — M5137 Other intervertebral disc degeneration, lumbosacral region: Secondary | ICD-10-CM | POA: Diagnosis not present

## 2016-03-01 DIAGNOSIS — M25551 Pain in right hip: Secondary | ICD-10-CM | POA: Diagnosis not present

## 2016-03-01 DIAGNOSIS — M9904 Segmental and somatic dysfunction of sacral region: Secondary | ICD-10-CM | POA: Diagnosis not present

## 2016-03-06 DIAGNOSIS — M6281 Muscle weakness (generalized): Secondary | ICD-10-CM | POA: Diagnosis not present

## 2016-03-06 DIAGNOSIS — R278 Other lack of coordination: Secondary | ICD-10-CM | POA: Diagnosis not present

## 2016-03-06 DIAGNOSIS — R2689 Other abnormalities of gait and mobility: Secondary | ICD-10-CM | POA: Diagnosis not present

## 2016-03-07 DIAGNOSIS — M7741 Metatarsalgia, right foot: Secondary | ICD-10-CM | POA: Diagnosis not present

## 2016-03-07 DIAGNOSIS — M4316 Spondylolisthesis, lumbar region: Secondary | ICD-10-CM | POA: Diagnosis not present

## 2016-03-07 DIAGNOSIS — M7742 Metatarsalgia, left foot: Secondary | ICD-10-CM | POA: Diagnosis not present

## 2016-03-07 DIAGNOSIS — M25552 Pain in left hip: Secondary | ICD-10-CM | POA: Diagnosis not present

## 2016-03-07 DIAGNOSIS — S22080A Wedge compression fracture of T11-T12 vertebra, initial encounter for closed fracture: Secondary | ICD-10-CM | POA: Diagnosis not present

## 2016-03-07 DIAGNOSIS — M25551 Pain in right hip: Secondary | ICD-10-CM | POA: Diagnosis not present

## 2016-03-07 DIAGNOSIS — M9906 Segmental and somatic dysfunction of lower extremity: Secondary | ICD-10-CM | POA: Diagnosis not present

## 2016-03-07 DIAGNOSIS — M5137 Other intervertebral disc degeneration, lumbosacral region: Secondary | ICD-10-CM | POA: Diagnosis not present

## 2016-03-07 DIAGNOSIS — M2142 Flat foot [pes planus] (acquired), left foot: Secondary | ICD-10-CM | POA: Diagnosis not present

## 2016-03-07 DIAGNOSIS — M9905 Segmental and somatic dysfunction of pelvic region: Secondary | ICD-10-CM | POA: Diagnosis not present

## 2016-03-07 DIAGNOSIS — M9904 Segmental and somatic dysfunction of sacral region: Secondary | ICD-10-CM | POA: Diagnosis not present

## 2016-03-07 DIAGNOSIS — M9903 Segmental and somatic dysfunction of lumbar region: Secondary | ICD-10-CM | POA: Diagnosis not present

## 2016-03-08 DIAGNOSIS — M6281 Muscle weakness (generalized): Secondary | ICD-10-CM | POA: Diagnosis not present

## 2016-03-08 DIAGNOSIS — R278 Other lack of coordination: Secondary | ICD-10-CM | POA: Diagnosis not present

## 2016-03-08 DIAGNOSIS — R2689 Other abnormalities of gait and mobility: Secondary | ICD-10-CM | POA: Diagnosis not present

## 2016-03-13 DIAGNOSIS — R278 Other lack of coordination: Secondary | ICD-10-CM | POA: Diagnosis not present

## 2016-03-13 DIAGNOSIS — M6281 Muscle weakness (generalized): Secondary | ICD-10-CM | POA: Diagnosis not present

## 2016-03-13 DIAGNOSIS — R2689 Other abnormalities of gait and mobility: Secondary | ICD-10-CM | POA: Diagnosis not present

## 2016-03-16 DIAGNOSIS — R2689 Other abnormalities of gait and mobility: Secondary | ICD-10-CM | POA: Diagnosis not present

## 2016-03-16 DIAGNOSIS — B351 Tinea unguium: Secondary | ICD-10-CM | POA: Diagnosis not present

## 2016-03-16 DIAGNOSIS — M79675 Pain in left toe(s): Secondary | ICD-10-CM | POA: Diagnosis not present

## 2016-03-16 DIAGNOSIS — M6281 Muscle weakness (generalized): Secondary | ICD-10-CM | POA: Diagnosis not present

## 2016-03-16 DIAGNOSIS — R278 Other lack of coordination: Secondary | ICD-10-CM | POA: Diagnosis not present

## 2016-03-20 DIAGNOSIS — R278 Other lack of coordination: Secondary | ICD-10-CM | POA: Diagnosis not present

## 2016-03-20 DIAGNOSIS — M6281 Muscle weakness (generalized): Secondary | ICD-10-CM | POA: Diagnosis not present

## 2016-03-20 DIAGNOSIS — R2689 Other abnormalities of gait and mobility: Secondary | ICD-10-CM | POA: Diagnosis not present

## 2016-03-22 DIAGNOSIS — R2689 Other abnormalities of gait and mobility: Secondary | ICD-10-CM | POA: Diagnosis not present

## 2016-03-22 DIAGNOSIS — M6281 Muscle weakness (generalized): Secondary | ICD-10-CM | POA: Diagnosis not present

## 2016-03-22 DIAGNOSIS — R278 Other lack of coordination: Secondary | ICD-10-CM | POA: Diagnosis not present

## 2016-03-27 DIAGNOSIS — R2689 Other abnormalities of gait and mobility: Secondary | ICD-10-CM | POA: Diagnosis not present

## 2016-03-27 DIAGNOSIS — M6281 Muscle weakness (generalized): Secondary | ICD-10-CM | POA: Diagnosis not present

## 2016-03-27 DIAGNOSIS — R278 Other lack of coordination: Secondary | ICD-10-CM | POA: Diagnosis not present

## 2016-03-29 DIAGNOSIS — M6281 Muscle weakness (generalized): Secondary | ICD-10-CM | POA: Diagnosis not present

## 2016-03-29 DIAGNOSIS — R278 Other lack of coordination: Secondary | ICD-10-CM | POA: Diagnosis not present

## 2016-03-29 DIAGNOSIS — R2689 Other abnormalities of gait and mobility: Secondary | ICD-10-CM | POA: Diagnosis not present

## 2016-04-03 DIAGNOSIS — R2689 Other abnormalities of gait and mobility: Secondary | ICD-10-CM | POA: Diagnosis not present

## 2016-04-03 DIAGNOSIS — M6281 Muscle weakness (generalized): Secondary | ICD-10-CM | POA: Diagnosis not present

## 2016-04-03 DIAGNOSIS — R278 Other lack of coordination: Secondary | ICD-10-CM | POA: Diagnosis not present

## 2016-04-05 DIAGNOSIS — M6281 Muscle weakness (generalized): Secondary | ICD-10-CM | POA: Diagnosis not present

## 2016-04-05 DIAGNOSIS — R278 Other lack of coordination: Secondary | ICD-10-CM | POA: Diagnosis not present

## 2016-04-05 DIAGNOSIS — R2689 Other abnormalities of gait and mobility: Secondary | ICD-10-CM | POA: Diagnosis not present

## 2016-04-11 DIAGNOSIS — Z96643 Presence of artificial hip joint, bilateral: Secondary | ICD-10-CM | POA: Diagnosis not present

## 2016-04-11 DIAGNOSIS — Z471 Aftercare following joint replacement surgery: Secondary | ICD-10-CM | POA: Diagnosis not present

## 2016-04-11 DIAGNOSIS — M170 Bilateral primary osteoarthritis of knee: Secondary | ICD-10-CM | POA: Diagnosis not present

## 2016-04-12 DIAGNOSIS — R2689 Other abnormalities of gait and mobility: Secondary | ICD-10-CM | POA: Diagnosis not present

## 2016-04-12 DIAGNOSIS — M6281 Muscle weakness (generalized): Secondary | ICD-10-CM | POA: Diagnosis not present

## 2016-04-12 DIAGNOSIS — R278 Other lack of coordination: Secondary | ICD-10-CM | POA: Diagnosis not present

## 2016-04-17 DIAGNOSIS — Z6825 Body mass index (BMI) 25.0-25.9, adult: Secondary | ICD-10-CM | POA: Diagnosis not present

## 2016-04-17 DIAGNOSIS — H6123 Impacted cerumen, bilateral: Secondary | ICD-10-CM | POA: Diagnosis not present

## 2016-04-17 DIAGNOSIS — M25471 Effusion, right ankle: Secondary | ICD-10-CM | POA: Diagnosis not present

## 2016-04-17 DIAGNOSIS — E663 Overweight: Secondary | ICD-10-CM | POA: Diagnosis not present

## 2016-04-18 DIAGNOSIS — M6281 Muscle weakness (generalized): Secondary | ICD-10-CM | POA: Diagnosis not present

## 2016-04-18 DIAGNOSIS — R278 Other lack of coordination: Secondary | ICD-10-CM | POA: Diagnosis not present

## 2016-04-18 DIAGNOSIS — R2689 Other abnormalities of gait and mobility: Secondary | ICD-10-CM | POA: Diagnosis not present

## 2016-05-02 DIAGNOSIS — H5203 Hypermetropia, bilateral: Secondary | ICD-10-CM | POA: Diagnosis not present

## 2016-05-02 DIAGNOSIS — Z83511 Family history of glaucoma: Secondary | ICD-10-CM | POA: Diagnosis not present

## 2016-05-02 DIAGNOSIS — Z961 Presence of intraocular lens: Secondary | ICD-10-CM | POA: Diagnosis not present

## 2016-05-02 DIAGNOSIS — H26493 Other secondary cataract, bilateral: Secondary | ICD-10-CM | POA: Diagnosis not present

## 2016-05-02 DIAGNOSIS — H401132 Primary open-angle glaucoma, bilateral, moderate stage: Secondary | ICD-10-CM | POA: Diagnosis not present

## 2016-05-02 DIAGNOSIS — H43391 Other vitreous opacities, right eye: Secondary | ICD-10-CM | POA: Diagnosis not present

## 2016-05-15 DIAGNOSIS — H26491 Other secondary cataract, right eye: Secondary | ICD-10-CM | POA: Diagnosis not present

## 2016-05-18 DIAGNOSIS — B351 Tinea unguium: Secondary | ICD-10-CM | POA: Diagnosis not present

## 2016-05-18 DIAGNOSIS — M79675 Pain in left toe(s): Secondary | ICD-10-CM | POA: Diagnosis not present

## 2016-05-18 DIAGNOSIS — M79674 Pain in right toe(s): Secondary | ICD-10-CM | POA: Diagnosis not present

## 2016-06-13 DIAGNOSIS — I10 Essential (primary) hypertension: Secondary | ICD-10-CM | POA: Diagnosis not present

## 2016-06-13 DIAGNOSIS — H8113 Benign paroxysmal vertigo, bilateral: Secondary | ICD-10-CM | POA: Diagnosis not present

## 2016-06-13 DIAGNOSIS — Z6826 Body mass index (BMI) 26.0-26.9, adult: Secondary | ICD-10-CM | POA: Diagnosis not present

## 2016-06-13 DIAGNOSIS — R262 Difficulty in walking, not elsewhere classified: Secondary | ICD-10-CM | POA: Diagnosis not present

## 2016-06-20 ENCOUNTER — Ambulatory Visit (HOSPITAL_COMMUNITY)
Admission: EM | Admit: 2016-06-20 | Discharge: 2016-06-20 | Disposition: A | Discharge status: Home / Self Care | Payer: Medicare Other | Attending: Family Medicine | Admitting: Family Medicine

## 2016-06-20 ENCOUNTER — Encounter (HOSPITAL_COMMUNITY): Payer: Self-pay | Admitting: Emergency Medicine

## 2016-06-20 DIAGNOSIS — I10 Essential (primary) hypertension: Secondary | ICD-10-CM

## 2016-06-20 LAB — POCT I-STAT, CHEM 8
BUN: 19 mg/dL (ref 6–20)
CALCIUM ION: 1.27 mmol/L — AB (ref 1.12–1.23)
CREATININE: 0.8 mg/dL (ref 0.44–1.00)
Chloride: 103 mmol/L (ref 101–111)
GLUCOSE: 86 mg/dL (ref 65–99)
HCT: 41 % (ref 36.0–46.0)
HEMOGLOBIN: 13.9 g/dL (ref 12.0–15.0)
Potassium: 4 mmol/L (ref 3.5–5.1)
Sodium: 140 mmol/L (ref 135–145)
TCO2: 27 mmol/L (ref 0–100)

## 2016-06-20 NOTE — ED Triage Notes (Signed)
The patient presented to the [facility] with a complaint of acute hypertension. The patient was referred to the [facility] by her dentist as she presented hypertensive this am prior to a procedure. The patient's BP was auscultated at 138/82 and confirmed by palpation at 140. The patient presented asymptomatic.

## 2016-06-20 NOTE — Discharge Instructions (Signed)
Your bp was normal today at 138/82. Continue your medicine, see your doctor as planned.

## 2016-06-20 NOTE — ED Provider Notes (Addendum)
[facility]    CSN: [id number] Arrival date & time: 06/20/16  1110  First Provider Contact:  First MD Initiated Contact with Patient 06/20/16 1246        History   Chief Complaint Chief Complaint  Patient presents with  . Hypertension    HPI Christy Houston is a 82 y.o. female.   The history is provided by the patient and a relative.  Hypertension  This is a chronic problem. The current episode started 6 to 12 hours ago (found at dentist office this am to have hbp, here for eval.). The problem has been resolved. Pertinent negatives include no chest pain, no headaches and no shortness of breath. Nothing aggravates the symptoms. Nothing relieves the symptoms.    Past Medical History:  Diagnosis Date  . Arthritis    osteoarthritis hips  . Cancer ([place])    skin cancer "basal cell", Right  kidney cancer  . Chronic kidney disease 2010   right nephrectomy  . Fractures, multiple    2015- Rt. femur, hx, left elbow, rt. shoulder  . Glaucoma    bilateral  . Hypercholesteremia   . Hypertension   . PONV (postoperative nausea and vomiting) sept 2015   severe nausea x several weeks after surgery    Patient Active Problem List   Diagnosis Date Noted  . Heterotopic ossification of bone 05/08/2015  . OA (osteoarthritis) of hip 08/04/2014    Past Surgical History:  Procedure Laterality Date  . ABDOMINAL HYSTERECTOMY    . Cataracts Bilateral   . JOINT REPLACEMENT Left    LTHA '07  . MASS EXCISION Right 05/09/2015   Procedure: EXCISION HETEROTOPIC OSSIFICATION RIGHT HIP;  Surgeon: Gaynelle Arabian, MD;  Location: [facility];  Service: Orthopedics;  Laterality: Right;  . TOTAL HIP ARTHROPLASTY Right 08/04/2014   Procedure: RIGHT TOTAL HIP ARTHROPLASTY ANTERIOR APPROACH;  Surgeon: [name], MD;  Location: [facility];  Service: Orthopedics;  Laterality: Right;    OB History    No data available       Home Medications    Prior to Admission medications     Medication Sig Start Date End Date Taking? Authorizing Provider  amoxicillin (AMOXIL) 250 MG capsule Take 250 mg by mouth 3 (three) times daily.   Yes Historical Provider, MD  aspirin 81 MG tablet Take 81 mg by mouth daily.   Yes Historical Provider, MD  atorvastatin (LIPITOR) 10 MG tablet Take 10 mg by mouth every morning.   Yes Historical Provider, MD  cholecalciferol (VITAMIN D) 1000 UNITS tablet Take 1,000 Units by mouth daily.   Yes Historical Provider, MD  meclizine (ANTIVERT) 12.5 MG tablet Take 12.5 mg by mouth 3 (three) times daily as needed for dizziness.   Yes Historical Provider, MD  travoprost, benzalkonium, (TRAVATAN) 0.004 % ophthalmic solution Place 1 drop into both eyes at bedtime.   Yes Historical Provider, MD  valsartan (DIOVAN) 320 MG tablet Take 320 mg by mouth every evening.   Yes Historical Provider, MD    Family History History reviewed. No pertinent family history.  Social History Social History  Substance Use Topics  . Smoking status: Former Smoker    Packs/day: 1.00    Types: Cigarettes    Quit date: 07/31/1999  . Smokeless tobacco: Never Used     Comment: Quit  . Alcohol use Yes     Comment: wine rare social     Allergies   Review of patient's allergies indicates no known allergies.  Review of Systems Review of Systems  Constitutional: Negative.   HENT: Negative.   Respiratory: Negative for shortness of breath.   Cardiovascular: Positive for leg swelling. Negative for chest pain and palpitations.  Neurological: Negative for dizziness, light-headedness, numbness and headaches.  All other systems reviewed and are negative.    Physical Exam Triage Vital Signs ED Triage Vitals [06/20/16 1237]  Enc Vitals Group     BP 138/82     Pulse Rate 78     Resp 18     Temp 98.6 F (37 C)     Temp Source Oral     SpO2 97 %     Weight      Height      Head Circumference      Peak Flow      Pain Score      Pain Loc      Pain Edu?      Excl. in  [place]?    No data found.   Updated Vital Signs BP 138/82 (BP Location: Left Arm)   Pulse 78   Temp 98.6 F (37 C) (Oral)   Resp 18   SpO2 97%   Visual Acuity Right Eye Distance:   Left Eye Distance:   Bilateral Distance:    Right Eye Near:   Left Eye Near:    Bilateral Near:     Physical Exam  Constitutional: She is oriented to person, place, and time. She appears well-developed and well-nourished.  Neck: Normal range of motion. Neck supple.  Cardiovascular: Normal rate, regular rhythm and normal heart sounds.   Pulmonary/Chest: Effort normal and breath sounds normal.  Musculoskeletal: She exhibits edema.  Chronic edema, prev known, unchanged.  Neurological: She is alert and oriented to person, place, and time.  Skin: Skin is warm and dry. Capillary refill takes less than 2 seconds.  Nursing note and vitals reviewed.    UC Treatments / Results  Labs (all labs ordered are listed, but only abnormal results are displayed) Labs Reviewed  POCT I-STAT, CHEM 8 - Abnormal; Notable for the following:       Result Value   Calcium, Ion 1.27 (*)    All other components within normal limits   I-stat wnl  EKG  EKG Interpretation None       Radiology No results found.  Procedures Procedures (including critical care time)  Medications Ordered in UC Medications - No data to display   Initial Impression / Assessment and Plan / UC Course  I have reviewed the triage vital signs and the nursing notes.  Pertinent labs & imaging results that were available during my care of the patient were reviewed by me and considered in my medical decision making (see chart for details).  Clinical Course      Final Clinical Impressions(s) / UC Diagnoses   Final diagnoses:  Essential hypertension    New Prescriptions Discharge Medication List as of 06/20/2016  1:40 PM       Billy Fischer, MD 06/20/16 [place], MD 06/26/16 2012

## 2016-06-22 DIAGNOSIS — Z6826 Body mass index (BMI) 26.0-26.9, adult: Secondary | ICD-10-CM | POA: Diagnosis not present

## 2016-06-22 DIAGNOSIS — I10 Essential (primary) hypertension: Secondary | ICD-10-CM | POA: Diagnosis not present

## 2016-06-28 DIAGNOSIS — R2689 Other abnormalities of gait and mobility: Secondary | ICD-10-CM | POA: Diagnosis not present

## 2016-06-28 DIAGNOSIS — M6281 Muscle weakness (generalized): Secondary | ICD-10-CM | POA: Diagnosis not present

## 2016-06-28 IMAGING — CR DG FOREARM 2V*R*
2 series · 2 of 2 positions shown · non-contrast
Comparison: No prior exams. Concurrently performed elbow
radiographs.

CLINICAL DATA: Fall striking right forearm against [place] arm of
chair, now with swelling.

EXAM:
RIGHT FOREARM - 2 VIEW

[x forearm ap right]
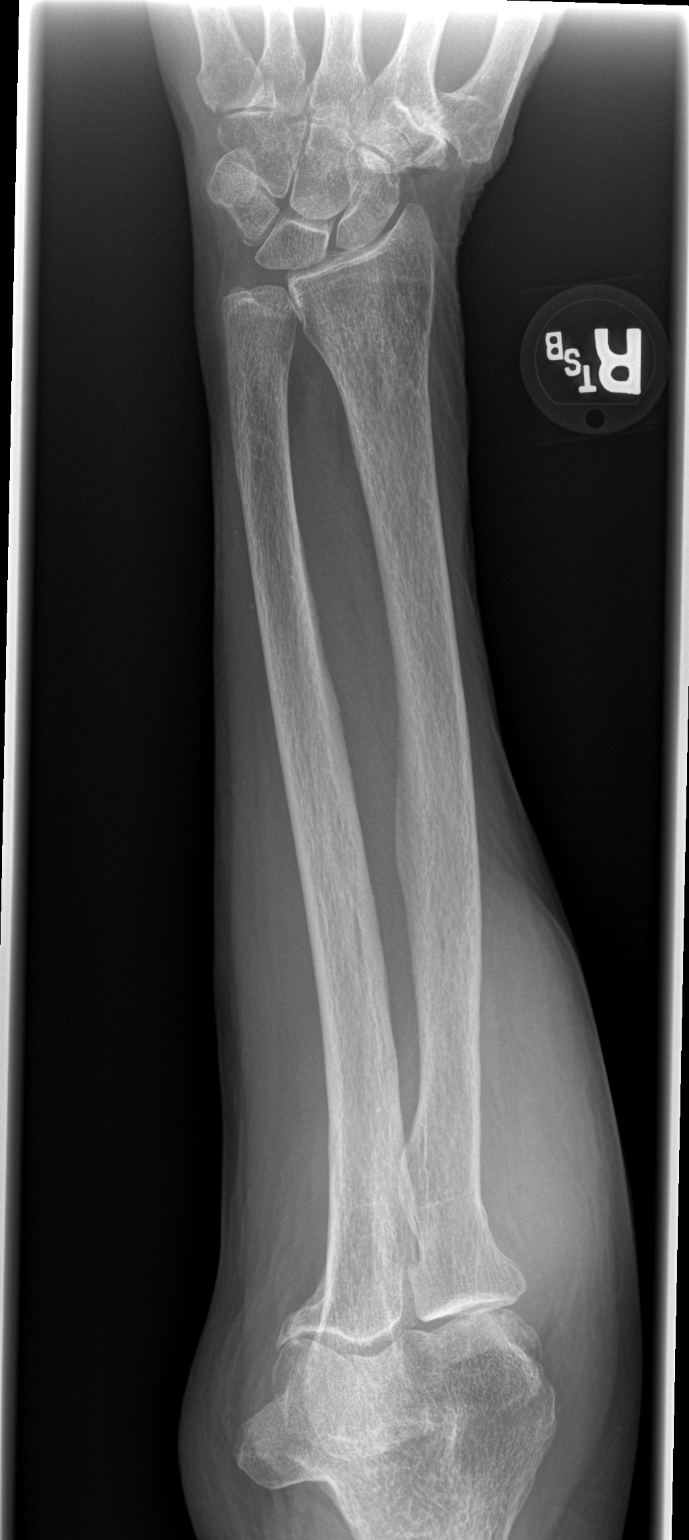

[x forearm lat right]
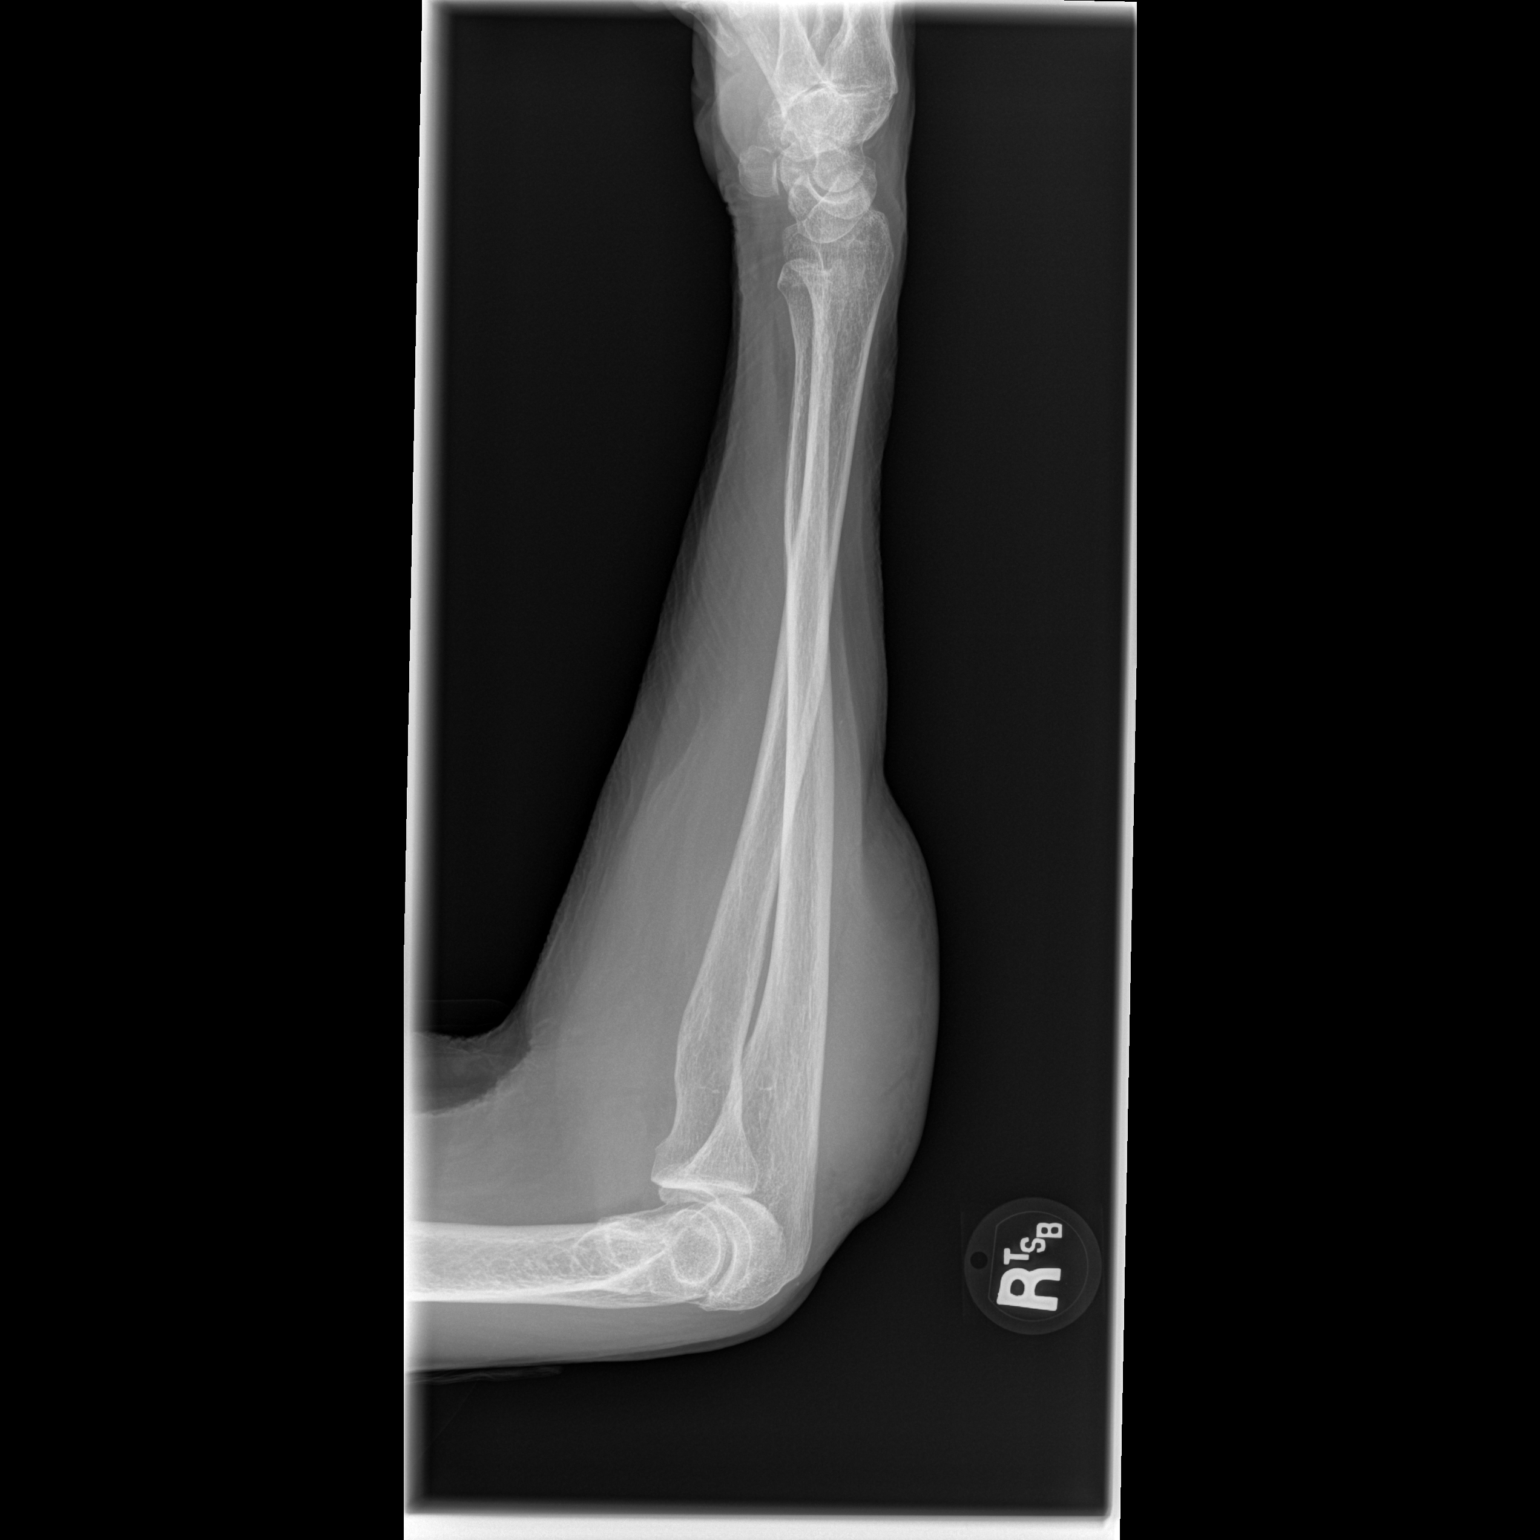

[2 of 2 positions shown; findings below may reference images not displayed]

FINDINGS: Cortical margins of the radius ulna appear intact. Wrist alignment
is grossly maintained. Focal soft tissue prominence about the dorsal
aspect of the proximal forearm. No radiopaque foreign body.
IMPRESSION: Proximal forearm soft tissue prominence consistent with hematoma. No
acute fracture.

## 2016-07-02 DIAGNOSIS — R2689 Other abnormalities of gait and mobility: Secondary | ICD-10-CM | POA: Diagnosis not present

## 2016-07-02 DIAGNOSIS — M6281 Muscle weakness (generalized): Secondary | ICD-10-CM | POA: Diagnosis not present

## 2016-07-03 DIAGNOSIS — H8113 Benign paroxysmal vertigo, bilateral: Secondary | ICD-10-CM | POA: Diagnosis not present

## 2016-07-03 DIAGNOSIS — I10 Essential (primary) hypertension: Secondary | ICD-10-CM | POA: Diagnosis not present

## 2016-07-03 DIAGNOSIS — Z6826 Body mass index (BMI) 26.0-26.9, adult: Secondary | ICD-10-CM | POA: Diagnosis not present

## 2016-07-05 DIAGNOSIS — M6281 Muscle weakness (generalized): Secondary | ICD-10-CM | POA: Diagnosis not present

## 2016-07-05 DIAGNOSIS — R2689 Other abnormalities of gait and mobility: Secondary | ICD-10-CM | POA: Diagnosis not present

## 2016-07-07 DIAGNOSIS — R110 Nausea: Secondary | ICD-10-CM | POA: Diagnosis not present

## 2016-07-07 DIAGNOSIS — R404 Transient alteration of awareness: Secondary | ICD-10-CM | POA: Diagnosis not present

## 2016-07-07 DIAGNOSIS — R42 Dizziness and giddiness: Secondary | ICD-10-CM | POA: Diagnosis not present

## 2016-07-08 DIAGNOSIS — E876 Hypokalemia: Secondary | ICD-10-CM | POA: Diagnosis not present

## 2016-07-08 DIAGNOSIS — K2970 Gastritis, unspecified, without bleeding: Secondary | ICD-10-CM | POA: Diagnosis present

## 2016-07-08 DIAGNOSIS — I119 Hypertensive heart disease without heart failure: Secondary | ICD-10-CM | POA: Diagnosis not present

## 2016-07-08 DIAGNOSIS — R748 Abnormal levels of other serum enzymes: Secondary | ICD-10-CM | POA: Diagnosis not present

## 2016-07-08 DIAGNOSIS — I440 Atrioventricular block, first degree: Secondary | ICD-10-CM | POA: Diagnosis not present

## 2016-07-08 DIAGNOSIS — K449 Diaphragmatic hernia without obstruction or gangrene: Secondary | ICD-10-CM | POA: Diagnosis present

## 2016-07-08 DIAGNOSIS — H81319 Aural vertigo, unspecified ear: Secondary | ICD-10-CM | POA: Diagnosis not present

## 2016-07-08 DIAGNOSIS — G629 Polyneuropathy, unspecified: Secondary | ICD-10-CM | POA: Diagnosis present

## 2016-07-08 DIAGNOSIS — R079 Chest pain, unspecified: Secondary | ICD-10-CM | POA: Diagnosis not present

## 2016-07-08 DIAGNOSIS — Z880 Allergy status to penicillin: Secondary | ICD-10-CM | POA: Diagnosis not present

## 2016-07-08 DIAGNOSIS — Z905 Acquired absence of kidney: Secondary | ICD-10-CM | POA: Diagnosis not present

## 2016-07-08 DIAGNOSIS — I252 Old myocardial infarction: Secondary | ICD-10-CM | POA: Diagnosis not present

## 2016-07-08 DIAGNOSIS — R9431 Abnormal electrocardiogram [ECG] [EKG]: Secondary | ICD-10-CM | POA: Diagnosis not present

## 2016-07-08 DIAGNOSIS — Z96649 Presence of unspecified artificial hip joint: Secondary | ICD-10-CM | POA: Diagnosis present

## 2016-07-08 DIAGNOSIS — K209 Esophagitis, unspecified: Secondary | ICD-10-CM | POA: Diagnosis not present

## 2016-07-08 DIAGNOSIS — R404 Transient alteration of awareness: Secondary | ICD-10-CM | POA: Diagnosis not present

## 2016-07-08 DIAGNOSIS — R001 Bradycardia, unspecified: Secondary | ICD-10-CM | POA: Diagnosis not present

## 2016-07-08 DIAGNOSIS — R42 Dizziness and giddiness: Secondary | ICD-10-CM | POA: Diagnosis not present

## 2016-07-08 DIAGNOSIS — I495 Sick sinus syndrome: Secondary | ICD-10-CM | POA: Diagnosis not present

## 2016-07-08 DIAGNOSIS — K208 Other esophagitis: Secondary | ICD-10-CM | POA: Diagnosis not present

## 2016-07-08 DIAGNOSIS — K317 Polyp of stomach and duodenum: Secondary | ICD-10-CM | POA: Diagnosis not present

## 2016-07-08 DIAGNOSIS — I672 Cerebral atherosclerosis: Secondary | ICD-10-CM | POA: Diagnosis present

## 2016-07-08 DIAGNOSIS — R110 Nausea: Secondary | ICD-10-CM | POA: Diagnosis not present

## 2016-07-08 DIAGNOSIS — R112 Nausea with vomiting, unspecified: Secondary | ICD-10-CM | POA: Diagnosis not present

## 2016-07-08 DIAGNOSIS — I517 Cardiomegaly: Secondary | ICD-10-CM | POA: Diagnosis not present

## 2016-07-08 DIAGNOSIS — Z9181 History of falling: Secondary | ICD-10-CM | POA: Diagnosis not present

## 2016-07-08 DIAGNOSIS — I951 Orthostatic hypotension: Secondary | ICD-10-CM | POA: Diagnosis not present

## 2016-07-08 DIAGNOSIS — M1990 Unspecified osteoarthritis, unspecified site: Secondary | ICD-10-CM | POA: Diagnosis not present

## 2016-07-08 DIAGNOSIS — B3781 Candidal esophagitis: Secondary | ICD-10-CM | POA: Diagnosis not present

## 2016-07-08 DIAGNOSIS — I10 Essential (primary) hypertension: Secondary | ICD-10-CM | POA: Diagnosis not present

## 2016-07-08 DIAGNOSIS — K30 Functional dyspepsia: Secondary | ICD-10-CM | POA: Diagnosis present

## 2016-07-08 DIAGNOSIS — K3189 Other diseases of stomach and duodenum: Secondary | ICD-10-CM | POA: Diagnosis not present

## 2016-07-08 DIAGNOSIS — M1611 Unilateral primary osteoarthritis, right hip: Secondary | ICD-10-CM | POA: Diagnosis present

## 2016-07-08 DIAGNOSIS — I083 Combined rheumatic disorders of mitral, aortic and tricuspid valves: Secondary | ICD-10-CM | POA: Diagnosis not present

## 2016-07-08 DIAGNOSIS — K2980 Duodenitis without bleeding: Secondary | ICD-10-CM | POA: Diagnosis not present

## 2016-07-08 DIAGNOSIS — K2950 Unspecified chronic gastritis without bleeding: Secondary | ICD-10-CM | POA: Diagnosis not present

## 2016-07-08 DIAGNOSIS — K5900 Constipation, unspecified: Secondary | ICD-10-CM | POA: Diagnosis not present

## 2016-07-21 DIAGNOSIS — R293 Abnormal posture: Secondary | ICD-10-CM | POA: Diagnosis not present

## 2016-07-21 DIAGNOSIS — R42 Dizziness and giddiness: Secondary | ICD-10-CM | POA: Diagnosis not present

## 2016-07-21 DIAGNOSIS — M6281 Muscle weakness (generalized): Secondary | ICD-10-CM | POA: Diagnosis not present

## 2016-07-21 DIAGNOSIS — R278 Other lack of coordination: Secondary | ICD-10-CM | POA: Diagnosis not present

## 2016-07-23 DIAGNOSIS — H8110 Benign paroxysmal vertigo, unspecified ear: Secondary | ICD-10-CM | POA: Diagnosis not present

## 2016-07-23 DIAGNOSIS — M6281 Muscle weakness (generalized): Secondary | ICD-10-CM | POA: Diagnosis not present

## 2016-07-23 DIAGNOSIS — R112 Nausea with vomiting, unspecified: Secondary | ICD-10-CM | POA: Diagnosis not present

## 2016-07-23 DIAGNOSIS — I10 Essential (primary) hypertension: Secondary | ICD-10-CM | POA: Diagnosis not present

## 2016-07-23 DIAGNOSIS — R278 Other lack of coordination: Secondary | ICD-10-CM | POA: Diagnosis not present

## 2016-07-23 DIAGNOSIS — R293 Abnormal posture: Secondary | ICD-10-CM | POA: Diagnosis not present

## 2016-07-23 DIAGNOSIS — R42 Dizziness and giddiness: Secondary | ICD-10-CM | POA: Diagnosis not present

## 2016-07-24 DIAGNOSIS — E789 Disorder of lipoprotein metabolism, unspecified: Secondary | ICD-10-CM | POA: Diagnosis not present

## 2016-07-24 DIAGNOSIS — M6281 Muscle weakness (generalized): Secondary | ICD-10-CM | POA: Diagnosis not present

## 2016-07-24 DIAGNOSIS — D649 Anemia, unspecified: Secondary | ICD-10-CM | POA: Diagnosis not present

## 2016-07-24 DIAGNOSIS — R293 Abnormal posture: Secondary | ICD-10-CM | POA: Diagnosis not present

## 2016-07-24 DIAGNOSIS — R278 Other lack of coordination: Secondary | ICD-10-CM | POA: Diagnosis not present

## 2016-07-24 DIAGNOSIS — E559 Vitamin D deficiency, unspecified: Secondary | ICD-10-CM | POA: Diagnosis not present

## 2016-07-24 DIAGNOSIS — R42 Dizziness and giddiness: Secondary | ICD-10-CM | POA: Diagnosis not present

## 2016-07-24 DIAGNOSIS — I10 Essential (primary) hypertension: Secondary | ICD-10-CM | POA: Diagnosis not present

## 2016-07-25 DIAGNOSIS — R293 Abnormal posture: Secondary | ICD-10-CM | POA: Diagnosis not present

## 2016-07-25 DIAGNOSIS — M6281 Muscle weakness (generalized): Secondary | ICD-10-CM | POA: Diagnosis not present

## 2016-07-25 DIAGNOSIS — R42 Dizziness and giddiness: Secondary | ICD-10-CM | POA: Diagnosis not present

## 2016-07-25 DIAGNOSIS — R278 Other lack of coordination: Secondary | ICD-10-CM | POA: Diagnosis not present

## 2016-07-26 DIAGNOSIS — M6281 Muscle weakness (generalized): Secondary | ICD-10-CM | POA: Diagnosis not present

## 2016-07-26 DIAGNOSIS — R278 Other lack of coordination: Secondary | ICD-10-CM | POA: Diagnosis not present

## 2016-07-26 DIAGNOSIS — R42 Dizziness and giddiness: Secondary | ICD-10-CM | POA: Diagnosis not present

## 2016-07-26 DIAGNOSIS — R293 Abnormal posture: Secondary | ICD-10-CM | POA: Diagnosis not present

## 2016-07-27 DIAGNOSIS — R293 Abnormal posture: Secondary | ICD-10-CM | POA: Diagnosis not present

## 2016-07-27 DIAGNOSIS — M6281 Muscle weakness (generalized): Secondary | ICD-10-CM | POA: Diagnosis not present

## 2016-07-27 DIAGNOSIS — R42 Dizziness and giddiness: Secondary | ICD-10-CM | POA: Diagnosis not present

## 2016-07-27 DIAGNOSIS — R278 Other lack of coordination: Secondary | ICD-10-CM | POA: Diagnosis not present

## 2016-07-30 DIAGNOSIS — R42 Dizziness and giddiness: Secondary | ICD-10-CM | POA: Diagnosis not present

## 2016-07-30 DIAGNOSIS — R293 Abnormal posture: Secondary | ICD-10-CM | POA: Diagnosis not present

## 2016-07-30 DIAGNOSIS — R9431 Abnormal electrocardiogram [ECG] [EKG]: Secondary | ICD-10-CM | POA: Diagnosis not present

## 2016-07-30 DIAGNOSIS — M6281 Muscle weakness (generalized): Secondary | ICD-10-CM | POA: Diagnosis not present

## 2016-07-30 DIAGNOSIS — R278 Other lack of coordination: Secondary | ICD-10-CM | POA: Diagnosis not present

## 2016-07-30 DIAGNOSIS — I10 Essential (primary) hypertension: Secondary | ICD-10-CM | POA: Diagnosis not present

## 2016-07-31 DIAGNOSIS — M6281 Muscle weakness (generalized): Secondary | ICD-10-CM | POA: Diagnosis not present

## 2016-07-31 DIAGNOSIS — R42 Dizziness and giddiness: Secondary | ICD-10-CM | POA: Diagnosis not present

## 2016-07-31 DIAGNOSIS — R278 Other lack of coordination: Secondary | ICD-10-CM | POA: Diagnosis not present

## 2016-07-31 DIAGNOSIS — R293 Abnormal posture: Secondary | ICD-10-CM | POA: Diagnosis not present

## 2016-08-01 DIAGNOSIS — R278 Other lack of coordination: Secondary | ICD-10-CM | POA: Diagnosis not present

## 2016-08-01 DIAGNOSIS — R42 Dizziness and giddiness: Secondary | ICD-10-CM | POA: Diagnosis not present

## 2016-08-01 DIAGNOSIS — M6281 Muscle weakness (generalized): Secondary | ICD-10-CM | POA: Diagnosis not present

## 2016-08-01 DIAGNOSIS — R293 Abnormal posture: Secondary | ICD-10-CM | POA: Diagnosis not present

## 2016-08-02 DIAGNOSIS — R293 Abnormal posture: Secondary | ICD-10-CM | POA: Diagnosis not present

## 2016-08-02 DIAGNOSIS — M6281 Muscle weakness (generalized): Secondary | ICD-10-CM | POA: Diagnosis not present

## 2016-08-02 DIAGNOSIS — R42 Dizziness and giddiness: Secondary | ICD-10-CM | POA: Diagnosis not present

## 2016-08-02 DIAGNOSIS — R278 Other lack of coordination: Secondary | ICD-10-CM | POA: Diagnosis not present

## 2016-08-03 DIAGNOSIS — R278 Other lack of coordination: Secondary | ICD-10-CM | POA: Diagnosis not present

## 2016-08-03 DIAGNOSIS — M6281 Muscle weakness (generalized): Secondary | ICD-10-CM | POA: Diagnosis not present

## 2016-08-03 DIAGNOSIS — R293 Abnormal posture: Secondary | ICD-10-CM | POA: Diagnosis not present

## 2016-08-03 DIAGNOSIS — R42 Dizziness and giddiness: Secondary | ICD-10-CM | POA: Diagnosis not present

## 2016-08-06 DIAGNOSIS — R278 Other lack of coordination: Secondary | ICD-10-CM | POA: Diagnosis not present

## 2016-08-06 DIAGNOSIS — M6281 Muscle weakness (generalized): Secondary | ICD-10-CM | POA: Diagnosis not present

## 2016-08-06 DIAGNOSIS — R293 Abnormal posture: Secondary | ICD-10-CM | POA: Diagnosis not present

## 2016-08-06 DIAGNOSIS — R42 Dizziness and giddiness: Secondary | ICD-10-CM | POA: Diagnosis not present

## 2016-08-07 DIAGNOSIS — R293 Abnormal posture: Secondary | ICD-10-CM | POA: Diagnosis not present

## 2016-08-07 DIAGNOSIS — R42 Dizziness and giddiness: Secondary | ICD-10-CM | POA: Diagnosis not present

## 2016-08-07 DIAGNOSIS — M25552 Pain in left hip: Secondary | ICD-10-CM | POA: Diagnosis not present

## 2016-08-07 DIAGNOSIS — R278 Other lack of coordination: Secondary | ICD-10-CM | POA: Diagnosis not present

## 2016-08-07 DIAGNOSIS — M79652 Pain in left thigh: Secondary | ICD-10-CM | POA: Diagnosis not present

## 2016-08-07 DIAGNOSIS — M6281 Muscle weakness (generalized): Secondary | ICD-10-CM | POA: Diagnosis not present

## 2016-08-08 DIAGNOSIS — R293 Abnormal posture: Secondary | ICD-10-CM | POA: Diagnosis not present

## 2016-08-08 DIAGNOSIS — R278 Other lack of coordination: Secondary | ICD-10-CM | POA: Diagnosis not present

## 2016-08-08 DIAGNOSIS — R42 Dizziness and giddiness: Secondary | ICD-10-CM | POA: Diagnosis not present

## 2016-08-08 DIAGNOSIS — M6281 Muscle weakness (generalized): Secondary | ICD-10-CM | POA: Diagnosis not present

## 2016-08-08 DIAGNOSIS — H401132 Primary open-angle glaucoma, bilateral, moderate stage: Secondary | ICD-10-CM | POA: Diagnosis not present

## 2016-08-09 DIAGNOSIS — R293 Abnormal posture: Secondary | ICD-10-CM | POA: Diagnosis not present

## 2016-08-09 DIAGNOSIS — R42 Dizziness and giddiness: Secondary | ICD-10-CM | POA: Diagnosis not present

## 2016-08-09 DIAGNOSIS — R278 Other lack of coordination: Secondary | ICD-10-CM | POA: Diagnosis not present

## 2016-08-09 DIAGNOSIS — M6281 Muscle weakness (generalized): Secondary | ICD-10-CM | POA: Diagnosis not present

## 2016-08-10 DIAGNOSIS — R42 Dizziness and giddiness: Secondary | ICD-10-CM | POA: Diagnosis not present

## 2016-08-10 DIAGNOSIS — R278 Other lack of coordination: Secondary | ICD-10-CM | POA: Diagnosis not present

## 2016-08-10 DIAGNOSIS — R293 Abnormal posture: Secondary | ICD-10-CM | POA: Diagnosis not present

## 2016-08-10 DIAGNOSIS — M6281 Muscle weakness (generalized): Secondary | ICD-10-CM | POA: Diagnosis not present

## 2016-08-13 DIAGNOSIS — R293 Abnormal posture: Secondary | ICD-10-CM | POA: Diagnosis not present

## 2016-08-13 DIAGNOSIS — K5900 Constipation, unspecified: Secondary | ICD-10-CM | POA: Diagnosis not present

## 2016-08-13 DIAGNOSIS — M6281 Muscle weakness (generalized): Secondary | ICD-10-CM | POA: Diagnosis not present

## 2016-08-13 DIAGNOSIS — R278 Other lack of coordination: Secondary | ICD-10-CM | POA: Diagnosis not present

## 2016-08-13 DIAGNOSIS — R42 Dizziness and giddiness: Secondary | ICD-10-CM | POA: Diagnosis not present

## 2016-08-13 DIAGNOSIS — R112 Nausea with vomiting, unspecified: Secondary | ICD-10-CM | POA: Diagnosis not present

## 2016-08-16 DIAGNOSIS — R278 Other lack of coordination: Secondary | ICD-10-CM | POA: Diagnosis not present

## 2016-08-16 DIAGNOSIS — R2689 Other abnormalities of gait and mobility: Secondary | ICD-10-CM | POA: Diagnosis not present

## 2016-08-16 DIAGNOSIS — M6281 Muscle weakness (generalized): Secondary | ICD-10-CM | POA: Diagnosis not present

## 2016-08-20 DIAGNOSIS — R278 Other lack of coordination: Secondary | ICD-10-CM | POA: Diagnosis not present

## 2016-08-20 DIAGNOSIS — R2689 Other abnormalities of gait and mobility: Secondary | ICD-10-CM | POA: Diagnosis not present

## 2016-08-20 DIAGNOSIS — M79604 Pain in right leg: Secondary | ICD-10-CM | POA: Diagnosis not present

## 2016-08-20 DIAGNOSIS — M6281 Muscle weakness (generalized): Secondary | ICD-10-CM | POA: Diagnosis not present

## 2016-08-23 DIAGNOSIS — R278 Other lack of coordination: Secondary | ICD-10-CM | POA: Diagnosis not present

## 2016-08-23 DIAGNOSIS — R2689 Other abnormalities of gait and mobility: Secondary | ICD-10-CM | POA: Diagnosis not present

## 2016-08-23 DIAGNOSIS — M79604 Pain in right leg: Secondary | ICD-10-CM | POA: Diagnosis not present

## 2016-08-23 DIAGNOSIS — M6281 Muscle weakness (generalized): Secondary | ICD-10-CM | POA: Diagnosis not present

## 2016-08-27 DIAGNOSIS — R2689 Other abnormalities of gait and mobility: Secondary | ICD-10-CM | POA: Diagnosis not present

## 2016-08-27 DIAGNOSIS — M79604 Pain in right leg: Secondary | ICD-10-CM | POA: Diagnosis not present

## 2016-08-27 DIAGNOSIS — R278 Other lack of coordination: Secondary | ICD-10-CM | POA: Diagnosis not present

## 2016-08-27 DIAGNOSIS — M6281 Muscle weakness (generalized): Secondary | ICD-10-CM | POA: Diagnosis not present

## 2016-08-28 DIAGNOSIS — K224 Dyskinesia of esophagus: Secondary | ICD-10-CM | POA: Diagnosis not present

## 2016-08-28 DIAGNOSIS — Z6825 Body mass index (BMI) 25.0-25.9, adult: Secondary | ICD-10-CM | POA: Diagnosis not present

## 2016-08-28 DIAGNOSIS — Z1389 Encounter for screening for other disorder: Secondary | ICD-10-CM | POA: Diagnosis not present

## 2016-08-28 DIAGNOSIS — Z0000 Encounter for general adult medical examination without abnormal findings: Secondary | ICD-10-CM | POA: Diagnosis not present

## 2016-08-28 DIAGNOSIS — C641 Malignant neoplasm of right kidney, except renal pelvis: Secondary | ICD-10-CM | POA: Diagnosis not present

## 2016-08-28 DIAGNOSIS — L719 Rosacea, unspecified: Secondary | ICD-10-CM | POA: Diagnosis not present

## 2016-08-28 DIAGNOSIS — Z9181 History of falling: Secondary | ICD-10-CM | POA: Diagnosis not present

## 2016-08-28 DIAGNOSIS — E785 Hyperlipidemia, unspecified: Secondary | ICD-10-CM | POA: Diagnosis not present

## 2016-08-28 DIAGNOSIS — E559 Vitamin D deficiency, unspecified: Secondary | ICD-10-CM | POA: Diagnosis not present

## 2016-08-28 DIAGNOSIS — I10 Essential (primary) hypertension: Secondary | ICD-10-CM | POA: Diagnosis not present

## 2016-08-30 DIAGNOSIS — R2689 Other abnormalities of gait and mobility: Secondary | ICD-10-CM | POA: Diagnosis not present

## 2016-08-30 DIAGNOSIS — M6281 Muscle weakness (generalized): Secondary | ICD-10-CM | POA: Diagnosis not present

## 2016-08-30 DIAGNOSIS — M79604 Pain in right leg: Secondary | ICD-10-CM | POA: Diagnosis not present

## 2016-08-30 DIAGNOSIS — R278 Other lack of coordination: Secondary | ICD-10-CM | POA: Diagnosis not present

## 2016-08-31 DIAGNOSIS — B351 Tinea unguium: Secondary | ICD-10-CM | POA: Diagnosis not present

## 2016-08-31 DIAGNOSIS — M79675 Pain in left toe(s): Secondary | ICD-10-CM | POA: Diagnosis not present

## 2016-09-04 DIAGNOSIS — M79604 Pain in right leg: Secondary | ICD-10-CM | POA: Diagnosis not present

## 2016-09-04 DIAGNOSIS — R278 Other lack of coordination: Secondary | ICD-10-CM | POA: Diagnosis not present

## 2016-09-04 DIAGNOSIS — M6281 Muscle weakness (generalized): Secondary | ICD-10-CM | POA: Diagnosis not present

## 2016-09-04 DIAGNOSIS — R2689 Other abnormalities of gait and mobility: Secondary | ICD-10-CM | POA: Diagnosis not present

## 2016-09-06 DIAGNOSIS — R278 Other lack of coordination: Secondary | ICD-10-CM | POA: Diagnosis not present

## 2016-09-06 DIAGNOSIS — R2689 Other abnormalities of gait and mobility: Secondary | ICD-10-CM | POA: Diagnosis not present

## 2016-09-06 DIAGNOSIS — M79604 Pain in right leg: Secondary | ICD-10-CM | POA: Diagnosis not present

## 2016-09-06 DIAGNOSIS — M6281 Muscle weakness (generalized): Secondary | ICD-10-CM | POA: Diagnosis not present

## 2016-09-11 DIAGNOSIS — R2689 Other abnormalities of gait and mobility: Secondary | ICD-10-CM | POA: Diagnosis not present

## 2016-09-11 DIAGNOSIS — M79604 Pain in right leg: Secondary | ICD-10-CM | POA: Diagnosis not present

## 2016-09-11 DIAGNOSIS — M6281 Muscle weakness (generalized): Secondary | ICD-10-CM | POA: Diagnosis not present

## 2016-09-11 DIAGNOSIS — R278 Other lack of coordination: Secondary | ICD-10-CM | POA: Diagnosis not present

## 2016-09-13 DIAGNOSIS — R278 Other lack of coordination: Secondary | ICD-10-CM | POA: Diagnosis not present

## 2016-09-13 DIAGNOSIS — M79604 Pain in right leg: Secondary | ICD-10-CM | POA: Diagnosis not present

## 2016-09-13 DIAGNOSIS — M6281 Muscle weakness (generalized): Secondary | ICD-10-CM | POA: Diagnosis not present

## 2016-09-13 DIAGNOSIS — R2689 Other abnormalities of gait and mobility: Secondary | ICD-10-CM | POA: Diagnosis not present

## 2016-09-18 DIAGNOSIS — M79604 Pain in right leg: Secondary | ICD-10-CM | POA: Diagnosis not present

## 2016-09-18 DIAGNOSIS — R2689 Other abnormalities of gait and mobility: Secondary | ICD-10-CM | POA: Diagnosis not present

## 2016-09-18 DIAGNOSIS — R278 Other lack of coordination: Secondary | ICD-10-CM | POA: Diagnosis not present

## 2016-09-18 DIAGNOSIS — M6281 Muscle weakness (generalized): Secondary | ICD-10-CM | POA: Diagnosis not present

## 2016-09-20 DIAGNOSIS — R278 Other lack of coordination: Secondary | ICD-10-CM | POA: Diagnosis not present

## 2016-09-20 DIAGNOSIS — M79604 Pain in right leg: Secondary | ICD-10-CM | POA: Diagnosis not present

## 2016-09-20 DIAGNOSIS — R2689 Other abnormalities of gait and mobility: Secondary | ICD-10-CM | POA: Diagnosis not present

## 2016-09-20 DIAGNOSIS — M6281 Muscle weakness (generalized): Secondary | ICD-10-CM | POA: Diagnosis not present

## 2016-09-25 DIAGNOSIS — R278 Other lack of coordination: Secondary | ICD-10-CM | POA: Diagnosis not present

## 2016-09-25 DIAGNOSIS — R2689 Other abnormalities of gait and mobility: Secondary | ICD-10-CM | POA: Diagnosis not present

## 2016-09-25 DIAGNOSIS — M6281 Muscle weakness (generalized): Secondary | ICD-10-CM | POA: Diagnosis not present

## 2016-09-25 DIAGNOSIS — M79604 Pain in right leg: Secondary | ICD-10-CM | POA: Diagnosis not present

## 2016-09-27 DIAGNOSIS — R2689 Other abnormalities of gait and mobility: Secondary | ICD-10-CM | POA: Diagnosis not present

## 2016-09-27 DIAGNOSIS — R278 Other lack of coordination: Secondary | ICD-10-CM | POA: Diagnosis not present

## 2016-09-27 DIAGNOSIS — M6281 Muscle weakness (generalized): Secondary | ICD-10-CM | POA: Diagnosis not present

## 2016-09-27 DIAGNOSIS — M79604 Pain in right leg: Secondary | ICD-10-CM | POA: Diagnosis not present

## 2016-10-03 DIAGNOSIS — M79604 Pain in right leg: Secondary | ICD-10-CM | POA: Diagnosis not present

## 2016-10-03 DIAGNOSIS — M6281 Muscle weakness (generalized): Secondary | ICD-10-CM | POA: Diagnosis not present

## 2016-10-03 DIAGNOSIS — R2689 Other abnormalities of gait and mobility: Secondary | ICD-10-CM | POA: Diagnosis not present

## 2016-10-03 DIAGNOSIS — R278 Other lack of coordination: Secondary | ICD-10-CM | POA: Diagnosis not present

## 2016-10-04 DIAGNOSIS — Z471 Aftercare following joint replacement surgery: Secondary | ICD-10-CM | POA: Diagnosis not present

## 2016-10-04 DIAGNOSIS — Z96643 Presence of artificial hip joint, bilateral: Secondary | ICD-10-CM | POA: Diagnosis not present

## 2016-10-05 DIAGNOSIS — R2689 Other abnormalities of gait and mobility: Secondary | ICD-10-CM | POA: Diagnosis not present

## 2016-10-05 DIAGNOSIS — M79604 Pain in right leg: Secondary | ICD-10-CM | POA: Diagnosis not present

## 2016-10-05 DIAGNOSIS — M6281 Muscle weakness (generalized): Secondary | ICD-10-CM | POA: Diagnosis not present

## 2016-10-05 DIAGNOSIS — R278 Other lack of coordination: Secondary | ICD-10-CM | POA: Diagnosis not present

## 2016-10-08 DIAGNOSIS — M6281 Muscle weakness (generalized): Secondary | ICD-10-CM | POA: Diagnosis not present

## 2016-10-08 DIAGNOSIS — R278 Other lack of coordination: Secondary | ICD-10-CM | POA: Diagnosis not present

## 2016-10-08 DIAGNOSIS — R2689 Other abnormalities of gait and mobility: Secondary | ICD-10-CM | POA: Diagnosis not present

## 2016-10-08 DIAGNOSIS — M79604 Pain in right leg: Secondary | ICD-10-CM | POA: Diagnosis not present

## 2016-10-16 DIAGNOSIS — M6281 Muscle weakness (generalized): Secondary | ICD-10-CM | POA: Diagnosis not present

## 2016-10-16 DIAGNOSIS — R2689 Other abnormalities of gait and mobility: Secondary | ICD-10-CM | POA: Diagnosis not present

## 2016-10-16 DIAGNOSIS — M79604 Pain in right leg: Secondary | ICD-10-CM | POA: Diagnosis not present

## 2016-10-16 DIAGNOSIS — R278 Other lack of coordination: Secondary | ICD-10-CM | POA: Diagnosis not present

## 2016-10-18 DIAGNOSIS — M6281 Muscle weakness (generalized): Secondary | ICD-10-CM | POA: Diagnosis not present

## 2016-10-18 DIAGNOSIS — R2689 Other abnormalities of gait and mobility: Secondary | ICD-10-CM | POA: Diagnosis not present

## 2016-10-18 DIAGNOSIS — M79604 Pain in right leg: Secondary | ICD-10-CM | POA: Diagnosis not present

## 2016-10-18 DIAGNOSIS — R278 Other lack of coordination: Secondary | ICD-10-CM | POA: Diagnosis not present

## 2016-10-23 DIAGNOSIS — R278 Other lack of coordination: Secondary | ICD-10-CM | POA: Diagnosis not present

## 2016-10-23 DIAGNOSIS — M6281 Muscle weakness (generalized): Secondary | ICD-10-CM | POA: Diagnosis not present

## 2016-10-23 DIAGNOSIS — M79604 Pain in right leg: Secondary | ICD-10-CM | POA: Diagnosis not present

## 2016-10-23 DIAGNOSIS — R2689 Other abnormalities of gait and mobility: Secondary | ICD-10-CM | POA: Diagnosis not present

## 2016-10-25 DIAGNOSIS — R2689 Other abnormalities of gait and mobility: Secondary | ICD-10-CM | POA: Diagnosis not present

## 2016-10-25 DIAGNOSIS — M79604 Pain in right leg: Secondary | ICD-10-CM | POA: Diagnosis not present

## 2016-10-25 DIAGNOSIS — M6281 Muscle weakness (generalized): Secondary | ICD-10-CM | POA: Diagnosis not present

## 2016-10-25 DIAGNOSIS — R278 Other lack of coordination: Secondary | ICD-10-CM | POA: Diagnosis not present

## 2016-10-30 DIAGNOSIS — M6281 Muscle weakness (generalized): Secondary | ICD-10-CM | POA: Diagnosis not present

## 2016-10-30 DIAGNOSIS — R2689 Other abnormalities of gait and mobility: Secondary | ICD-10-CM | POA: Diagnosis not present

## 2016-10-30 DIAGNOSIS — M79604 Pain in right leg: Secondary | ICD-10-CM | POA: Diagnosis not present

## 2016-10-30 DIAGNOSIS — R278 Other lack of coordination: Secondary | ICD-10-CM | POA: Diagnosis not present

## 2016-11-01 DIAGNOSIS — M79604 Pain in right leg: Secondary | ICD-10-CM | POA: Diagnosis not present

## 2016-11-01 DIAGNOSIS — R2689 Other abnormalities of gait and mobility: Secondary | ICD-10-CM | POA: Diagnosis not present

## 2016-11-01 DIAGNOSIS — R278 Other lack of coordination: Secondary | ICD-10-CM | POA: Diagnosis not present

## 2016-11-01 DIAGNOSIS — M6281 Muscle weakness (generalized): Secondary | ICD-10-CM | POA: Diagnosis not present

## 2016-11-23 DIAGNOSIS — H524 Presbyopia: Secondary | ICD-10-CM | POA: Diagnosis not present

## 2016-11-23 DIAGNOSIS — H16223 Keratoconjunctivitis sicca, not specified as Sjogren's, bilateral: Secondary | ICD-10-CM | POA: Diagnosis not present

## 2016-11-23 DIAGNOSIS — H401132 Primary open-angle glaucoma, bilateral, moderate stage: Secondary | ICD-10-CM | POA: Diagnosis not present

## 2016-11-23 DIAGNOSIS — H5203 Hypermetropia, bilateral: Secondary | ICD-10-CM | POA: Diagnosis not present

## 2016-12-21 DIAGNOSIS — M79674 Pain in right toe(s): Secondary | ICD-10-CM | POA: Diagnosis not present

## 2016-12-21 DIAGNOSIS — B351 Tinea unguium: Secondary | ICD-10-CM | POA: Diagnosis not present

## 2016-12-21 DIAGNOSIS — M79675 Pain in left toe(s): Secondary | ICD-10-CM | POA: Diagnosis not present

## 2017-01-11 DIAGNOSIS — M25561 Pain in right knee: Secondary | ICD-10-CM | POA: Diagnosis not present

## 2017-01-11 DIAGNOSIS — M25562 Pain in left knee: Secondary | ICD-10-CM | POA: Diagnosis not present

## 2017-01-18 DIAGNOSIS — N3020 Other chronic cystitis without hematuria: Secondary | ICD-10-CM | POA: Diagnosis not present

## 2017-01-18 DIAGNOSIS — D4101 Neoplasm of uncertain behavior of right kidney: Secondary | ICD-10-CM | POA: Diagnosis not present

## 2017-01-18 DIAGNOSIS — N3090 Cystitis, unspecified without hematuria: Secondary | ICD-10-CM | POA: Diagnosis not present

## 2017-02-19 DIAGNOSIS — H401132 Primary open-angle glaucoma, bilateral, moderate stage: Secondary | ICD-10-CM | POA: Diagnosis not present

## 2017-02-19 DIAGNOSIS — H524 Presbyopia: Secondary | ICD-10-CM | POA: Diagnosis not present

## 2017-02-19 DIAGNOSIS — H16223 Keratoconjunctivitis sicca, not specified as Sjogren's, bilateral: Secondary | ICD-10-CM | POA: Diagnosis not present

## 2017-02-19 DIAGNOSIS — H52203 Unspecified astigmatism, bilateral: Secondary | ICD-10-CM | POA: Diagnosis not present

## 2017-02-19 DIAGNOSIS — Z83511 Family history of glaucoma: Secondary | ICD-10-CM | POA: Diagnosis not present

## 2017-02-22 DIAGNOSIS — M79674 Pain in right toe(s): Secondary | ICD-10-CM | POA: Diagnosis not present

## 2017-02-22 DIAGNOSIS — B351 Tinea unguium: Secondary | ICD-10-CM | POA: Diagnosis not present

## 2017-03-01 DIAGNOSIS — R05 Cough: Secondary | ICD-10-CM | POA: Diagnosis not present

## 2017-03-01 DIAGNOSIS — C641 Malignant neoplasm of right kidney, except renal pelvis: Secondary | ICD-10-CM | POA: Diagnosis not present

## 2017-03-01 DIAGNOSIS — E559 Vitamin D deficiency, unspecified: Secondary | ICD-10-CM | POA: Diagnosis not present

## 2017-03-01 DIAGNOSIS — I10 Essential (primary) hypertension: Secondary | ICD-10-CM | POA: Diagnosis not present

## 2017-03-01 DIAGNOSIS — I700 Atherosclerosis of aorta: Secondary | ICD-10-CM | POA: Diagnosis not present

## 2017-03-01 DIAGNOSIS — Z23 Encounter for immunization: Secondary | ICD-10-CM | POA: Diagnosis not present

## 2017-03-01 DIAGNOSIS — K224 Dyskinesia of esophagus: Secondary | ICD-10-CM | POA: Diagnosis not present

## 2017-03-01 DIAGNOSIS — E785 Hyperlipidemia, unspecified: Secondary | ICD-10-CM | POA: Diagnosis not present

## 2017-03-01 DIAGNOSIS — Z6826 Body mass index (BMI) 26.0-26.9, adult: Secondary | ICD-10-CM | POA: Diagnosis not present

## 2017-03-01 DIAGNOSIS — L719 Rosacea, unspecified: Secondary | ICD-10-CM | POA: Diagnosis not present

## 2017-04-02 DIAGNOSIS — H8113 Benign paroxysmal vertigo, bilateral: Secondary | ICD-10-CM | POA: Diagnosis not present

## 2017-04-02 DIAGNOSIS — Z6826 Body mass index (BMI) 26.0-26.9, adult: Secondary | ICD-10-CM | POA: Diagnosis not present

## 2017-04-05 DIAGNOSIS — H81393 Other peripheral vertigo, bilateral: Secondary | ICD-10-CM | POA: Diagnosis not present

## 2017-04-05 DIAGNOSIS — H903 Sensorineural hearing loss, bilateral: Secondary | ICD-10-CM | POA: Diagnosis not present

## 2017-04-06 DIAGNOSIS — L814 Other melanin hyperpigmentation: Secondary | ICD-10-CM | POA: Diagnosis not present

## 2017-04-06 DIAGNOSIS — L820 Inflamed seborrheic keratosis: Secondary | ICD-10-CM | POA: Diagnosis not present

## 2017-04-06 DIAGNOSIS — L578 Other skin changes due to chronic exposure to nonionizing radiation: Secondary | ICD-10-CM | POA: Diagnosis not present

## 2017-04-09 DIAGNOSIS — G4700 Insomnia, unspecified: Secondary | ICD-10-CM | POA: Diagnosis not present

## 2017-04-09 DIAGNOSIS — H8113 Benign paroxysmal vertigo, bilateral: Secondary | ICD-10-CM | POA: Diagnosis not present

## 2017-04-09 DIAGNOSIS — Z9181 History of falling: Secondary | ICD-10-CM | POA: Diagnosis not present

## 2017-04-09 DIAGNOSIS — M810 Age-related osteoporosis without current pathological fracture: Secondary | ICD-10-CM | POA: Diagnosis not present

## 2017-04-09 DIAGNOSIS — Z96643 Presence of artificial hip joint, bilateral: Secondary | ICD-10-CM | POA: Diagnosis not present

## 2017-04-09 DIAGNOSIS — F329 Major depressive disorder, single episode, unspecified: Secondary | ICD-10-CM | POA: Diagnosis not present

## 2017-04-09 DIAGNOSIS — R600 Localized edema: Secondary | ICD-10-CM | POA: Diagnosis not present

## 2017-04-09 DIAGNOSIS — Z7982 Long term (current) use of aspirin: Secondary | ICD-10-CM | POA: Diagnosis not present

## 2017-04-09 DIAGNOSIS — Z602 Problems related to living alone: Secondary | ICD-10-CM | POA: Diagnosis not present

## 2017-04-09 DIAGNOSIS — I10 Essential (primary) hypertension: Secondary | ICD-10-CM | POA: Diagnosis not present

## 2017-04-11 DIAGNOSIS — G4700 Insomnia, unspecified: Secondary | ICD-10-CM | POA: Diagnosis not present

## 2017-04-11 DIAGNOSIS — R600 Localized edema: Secondary | ICD-10-CM | POA: Diagnosis not present

## 2017-04-11 DIAGNOSIS — M810 Age-related osteoporosis without current pathological fracture: Secondary | ICD-10-CM | POA: Diagnosis not present

## 2017-04-11 DIAGNOSIS — H8113 Benign paroxysmal vertigo, bilateral: Secondary | ICD-10-CM | POA: Diagnosis not present

## 2017-04-11 DIAGNOSIS — F329 Major depressive disorder, single episode, unspecified: Secondary | ICD-10-CM | POA: Diagnosis not present

## 2017-04-11 DIAGNOSIS — I10 Essential (primary) hypertension: Secondary | ICD-10-CM | POA: Diagnosis not present

## 2017-04-16 DIAGNOSIS — I10 Essential (primary) hypertension: Secondary | ICD-10-CM | POA: Diagnosis not present

## 2017-04-16 DIAGNOSIS — R600 Localized edema: Secondary | ICD-10-CM | POA: Diagnosis not present

## 2017-04-16 DIAGNOSIS — H8113 Benign paroxysmal vertigo, bilateral: Secondary | ICD-10-CM | POA: Diagnosis not present

## 2017-04-16 DIAGNOSIS — M810 Age-related osteoporosis without current pathological fracture: Secondary | ICD-10-CM | POA: Diagnosis not present

## 2017-04-16 DIAGNOSIS — G4700 Insomnia, unspecified: Secondary | ICD-10-CM | POA: Diagnosis not present

## 2017-04-16 DIAGNOSIS — F329 Major depressive disorder, single episode, unspecified: Secondary | ICD-10-CM | POA: Diagnosis not present

## 2017-04-19 DIAGNOSIS — H8113 Benign paroxysmal vertigo, bilateral: Secondary | ICD-10-CM | POA: Diagnosis not present

## 2017-04-19 DIAGNOSIS — G4700 Insomnia, unspecified: Secondary | ICD-10-CM | POA: Diagnosis not present

## 2017-04-19 DIAGNOSIS — M810 Age-related osteoporosis without current pathological fracture: Secondary | ICD-10-CM | POA: Diagnosis not present

## 2017-04-19 DIAGNOSIS — I10 Essential (primary) hypertension: Secondary | ICD-10-CM | POA: Diagnosis not present

## 2017-04-19 DIAGNOSIS — F329 Major depressive disorder, single episode, unspecified: Secondary | ICD-10-CM | POA: Diagnosis not present

## 2017-04-19 DIAGNOSIS — R600 Localized edema: Secondary | ICD-10-CM | POA: Diagnosis not present

## 2017-04-25 DIAGNOSIS — H8113 Benign paroxysmal vertigo, bilateral: Secondary | ICD-10-CM | POA: Diagnosis not present

## 2017-04-25 DIAGNOSIS — M810 Age-related osteoporosis without current pathological fracture: Secondary | ICD-10-CM | POA: Diagnosis not present

## 2017-04-25 DIAGNOSIS — F329 Major depressive disorder, single episode, unspecified: Secondary | ICD-10-CM | POA: Diagnosis not present

## 2017-04-25 DIAGNOSIS — G4700 Insomnia, unspecified: Secondary | ICD-10-CM | POA: Diagnosis not present

## 2017-04-25 DIAGNOSIS — I10 Essential (primary) hypertension: Secondary | ICD-10-CM | POA: Diagnosis not present

## 2017-04-25 DIAGNOSIS — R600 Localized edema: Secondary | ICD-10-CM | POA: Diagnosis not present

## 2017-05-05 DIAGNOSIS — M549 Dorsalgia, unspecified: Secondary | ICD-10-CM | POA: Diagnosis not present

## 2017-05-05 DIAGNOSIS — M1990 Unspecified osteoarthritis, unspecified site: Secondary | ICD-10-CM | POA: Diagnosis not present

## 2017-05-05 DIAGNOSIS — Z905 Acquired absence of kidney: Secondary | ICD-10-CM | POA: Diagnosis not present

## 2017-05-05 DIAGNOSIS — S3993XA Unspecified injury of pelvis, initial encounter: Secondary | ICD-10-CM | POA: Diagnosis not present

## 2017-05-05 DIAGNOSIS — N189 Chronic kidney disease, unspecified: Secondary | ICD-10-CM | POA: Diagnosis not present

## 2017-05-05 DIAGNOSIS — S3992XA Unspecified injury of lower back, initial encounter: Secondary | ICD-10-CM | POA: Diagnosis not present

## 2017-05-05 DIAGNOSIS — I129 Hypertensive chronic kidney disease with stage 1 through stage 4 chronic kidney disease, or unspecified chronic kidney disease: Secondary | ICD-10-CM | POA: Diagnosis not present

## 2017-05-05 DIAGNOSIS — S40212A Abrasion of left shoulder, initial encounter: Secondary | ICD-10-CM | POA: Diagnosis not present

## 2017-05-05 DIAGNOSIS — M533 Sacrococcygeal disorders, not elsewhere classified: Secondary | ICD-10-CM | POA: Diagnosis not present

## 2017-05-05 DIAGNOSIS — R42 Dizziness and giddiness: Secondary | ICD-10-CM | POA: Diagnosis not present

## 2017-05-05 DIAGNOSIS — R404 Transient alteration of awareness: Secondary | ICD-10-CM | POA: Diagnosis not present

## 2017-05-05 DIAGNOSIS — Z96643 Presence of artificial hip joint, bilateral: Secondary | ICD-10-CM | POA: Diagnosis not present

## 2017-05-05 DIAGNOSIS — E7800 Pure hypercholesterolemia, unspecified: Secondary | ICD-10-CM | POA: Diagnosis not present

## 2017-05-05 DIAGNOSIS — Z8249 Family history of ischemic heart disease and other diseases of the circulatory system: Secondary | ICD-10-CM | POA: Diagnosis not present

## 2017-05-05 DIAGNOSIS — Z87891 Personal history of nicotine dependence: Secondary | ICD-10-CM | POA: Diagnosis not present

## 2017-05-05 DIAGNOSIS — R110 Nausea: Secondary | ICD-10-CM | POA: Diagnosis not present

## 2017-05-07 DIAGNOSIS — R278 Other lack of coordination: Secondary | ICD-10-CM | POA: Diagnosis not present

## 2017-05-07 DIAGNOSIS — Z9181 History of falling: Secondary | ICD-10-CM | POA: Diagnosis not present

## 2017-05-07 DIAGNOSIS — R293 Abnormal posture: Secondary | ICD-10-CM | POA: Diagnosis not present

## 2017-05-07 DIAGNOSIS — M6281 Muscle weakness (generalized): Secondary | ICD-10-CM | POA: Diagnosis not present

## 2017-05-08 DIAGNOSIS — R293 Abnormal posture: Secondary | ICD-10-CM | POA: Diagnosis not present

## 2017-05-08 DIAGNOSIS — H8110 Benign paroxysmal vertigo, unspecified ear: Secondary | ICD-10-CM | POA: Diagnosis not present

## 2017-05-08 DIAGNOSIS — I10 Essential (primary) hypertension: Secondary | ICD-10-CM | POA: Diagnosis not present

## 2017-05-08 DIAGNOSIS — Z9181 History of falling: Secondary | ICD-10-CM | POA: Diagnosis not present

## 2017-05-08 DIAGNOSIS — M6281 Muscle weakness (generalized): Secondary | ICD-10-CM | POA: Diagnosis not present

## 2017-05-08 DIAGNOSIS — R278 Other lack of coordination: Secondary | ICD-10-CM | POA: Diagnosis not present

## 2017-05-08 DIAGNOSIS — E785 Hyperlipidemia, unspecified: Secondary | ICD-10-CM | POA: Diagnosis not present

## 2017-05-08 DIAGNOSIS — R296 Repeated falls: Secondary | ICD-10-CM | POA: Diagnosis not present

## 2017-05-09 DIAGNOSIS — Z9181 History of falling: Secondary | ICD-10-CM | POA: Diagnosis not present

## 2017-05-09 DIAGNOSIS — M6281 Muscle weakness (generalized): Secondary | ICD-10-CM | POA: Diagnosis not present

## 2017-05-09 DIAGNOSIS — R278 Other lack of coordination: Secondary | ICD-10-CM | POA: Diagnosis not present

## 2017-05-09 DIAGNOSIS — R293 Abnormal posture: Secondary | ICD-10-CM | POA: Diagnosis not present

## 2017-05-10 DIAGNOSIS — M6281 Muscle weakness (generalized): Secondary | ICD-10-CM | POA: Diagnosis not present

## 2017-05-10 DIAGNOSIS — Z9181 History of falling: Secondary | ICD-10-CM | POA: Diagnosis not present

## 2017-05-10 DIAGNOSIS — R278 Other lack of coordination: Secondary | ICD-10-CM | POA: Diagnosis not present

## 2017-05-10 DIAGNOSIS — R293 Abnormal posture: Secondary | ICD-10-CM | POA: Diagnosis not present

## 2017-05-13 DIAGNOSIS — M6281 Muscle weakness (generalized): Secondary | ICD-10-CM | POA: Diagnosis not present

## 2017-05-13 DIAGNOSIS — R278 Other lack of coordination: Secondary | ICD-10-CM | POA: Diagnosis not present

## 2017-05-13 DIAGNOSIS — R293 Abnormal posture: Secondary | ICD-10-CM | POA: Diagnosis not present

## 2017-05-13 DIAGNOSIS — Z9181 History of falling: Secondary | ICD-10-CM | POA: Diagnosis not present

## 2017-05-14 DIAGNOSIS — M6281 Muscle weakness (generalized): Secondary | ICD-10-CM | POA: Diagnosis not present

## 2017-05-14 DIAGNOSIS — Z9181 History of falling: Secondary | ICD-10-CM | POA: Diagnosis not present

## 2017-05-14 DIAGNOSIS — R293 Abnormal posture: Secondary | ICD-10-CM | POA: Diagnosis not present

## 2017-05-14 DIAGNOSIS — R278 Other lack of coordination: Secondary | ICD-10-CM | POA: Diagnosis not present

## 2017-05-15 DIAGNOSIS — R293 Abnormal posture: Secondary | ICD-10-CM | POA: Diagnosis not present

## 2017-05-15 DIAGNOSIS — R278 Other lack of coordination: Secondary | ICD-10-CM | POA: Diagnosis not present

## 2017-05-15 DIAGNOSIS — M6281 Muscle weakness (generalized): Secondary | ICD-10-CM | POA: Diagnosis not present

## 2017-05-15 DIAGNOSIS — Z9181 History of falling: Secondary | ICD-10-CM | POA: Diagnosis not present

## 2017-05-16 DIAGNOSIS — R278 Other lack of coordination: Secondary | ICD-10-CM | POA: Diagnosis not present

## 2017-05-16 DIAGNOSIS — M6281 Muscle weakness (generalized): Secondary | ICD-10-CM | POA: Diagnosis not present

## 2017-05-16 DIAGNOSIS — R293 Abnormal posture: Secondary | ICD-10-CM | POA: Diagnosis not present

## 2017-05-16 DIAGNOSIS — Z9181 History of falling: Secondary | ICD-10-CM | POA: Diagnosis not present

## 2017-05-17 DIAGNOSIS — Z9181 History of falling: Secondary | ICD-10-CM | POA: Diagnosis not present

## 2017-05-17 DIAGNOSIS — M6281 Muscle weakness (generalized): Secondary | ICD-10-CM | POA: Diagnosis not present

## 2017-05-17 DIAGNOSIS — R293 Abnormal posture: Secondary | ICD-10-CM | POA: Diagnosis not present

## 2017-05-17 DIAGNOSIS — R278 Other lack of coordination: Secondary | ICD-10-CM | POA: Diagnosis not present

## 2017-05-19 DIAGNOSIS — R278 Other lack of coordination: Secondary | ICD-10-CM | POA: Diagnosis not present

## 2017-05-19 DIAGNOSIS — R293 Abnormal posture: Secondary | ICD-10-CM | POA: Diagnosis not present

## 2017-05-19 DIAGNOSIS — M6281 Muscle weakness (generalized): Secondary | ICD-10-CM | POA: Diagnosis not present

## 2017-05-19 DIAGNOSIS — H8143 Vertigo of central origin, bilateral: Secondary | ICD-10-CM | POA: Diagnosis not present

## 2017-05-20 DIAGNOSIS — H8143 Vertigo of central origin, bilateral: Secondary | ICD-10-CM | POA: Diagnosis not present

## 2017-05-20 DIAGNOSIS — R278 Other lack of coordination: Secondary | ICD-10-CM | POA: Diagnosis not present

## 2017-05-20 DIAGNOSIS — R293 Abnormal posture: Secondary | ICD-10-CM | POA: Diagnosis not present

## 2017-05-20 DIAGNOSIS — M6281 Muscle weakness (generalized): Secondary | ICD-10-CM | POA: Diagnosis not present

## 2017-05-21 DIAGNOSIS — M6281 Muscle weakness (generalized): Secondary | ICD-10-CM | POA: Diagnosis not present

## 2017-05-21 DIAGNOSIS — H8112 Benign paroxysmal vertigo, left ear: Secondary | ICD-10-CM | POA: Diagnosis not present

## 2017-05-21 DIAGNOSIS — R278 Other lack of coordination: Secondary | ICD-10-CM | POA: Diagnosis not present

## 2017-05-21 DIAGNOSIS — R293 Abnormal posture: Secondary | ICD-10-CM | POA: Diagnosis not present

## 2017-05-21 DIAGNOSIS — H8143 Vertigo of central origin, bilateral: Secondary | ICD-10-CM | POA: Diagnosis not present

## 2017-05-23 DIAGNOSIS — R293 Abnormal posture: Secondary | ICD-10-CM | POA: Diagnosis not present

## 2017-05-23 DIAGNOSIS — H8143 Vertigo of central origin, bilateral: Secondary | ICD-10-CM | POA: Diagnosis not present

## 2017-05-23 DIAGNOSIS — R278 Other lack of coordination: Secondary | ICD-10-CM | POA: Diagnosis not present

## 2017-05-23 DIAGNOSIS — M6281 Muscle weakness (generalized): Secondary | ICD-10-CM | POA: Diagnosis not present

## 2017-05-24 DIAGNOSIS — M6281 Muscle weakness (generalized): Secondary | ICD-10-CM | POA: Diagnosis not present

## 2017-05-24 DIAGNOSIS — R278 Other lack of coordination: Secondary | ICD-10-CM | POA: Diagnosis not present

## 2017-05-24 DIAGNOSIS — H8143 Vertigo of central origin, bilateral: Secondary | ICD-10-CM | POA: Diagnosis not present

## 2017-05-24 DIAGNOSIS — R293 Abnormal posture: Secondary | ICD-10-CM | POA: Diagnosis not present

## 2017-05-27 DIAGNOSIS — R293 Abnormal posture: Secondary | ICD-10-CM | POA: Diagnosis not present

## 2017-05-27 DIAGNOSIS — H353211 Exudative age-related macular degeneration, right eye, with active choroidal neovascularization: Secondary | ICD-10-CM | POA: Diagnosis not present

## 2017-05-27 DIAGNOSIS — H52203 Unspecified astigmatism, bilateral: Secondary | ICD-10-CM | POA: Diagnosis not present

## 2017-05-27 DIAGNOSIS — H353121 Nonexudative age-related macular degeneration, left eye, early dry stage: Secondary | ICD-10-CM | POA: Diagnosis not present

## 2017-05-27 DIAGNOSIS — H16223 Keratoconjunctivitis sicca, not specified as Sjogren's, bilateral: Secondary | ICD-10-CM | POA: Diagnosis not present

## 2017-05-27 DIAGNOSIS — H524 Presbyopia: Secondary | ICD-10-CM | POA: Diagnosis not present

## 2017-05-27 DIAGNOSIS — H8143 Vertigo of central origin, bilateral: Secondary | ICD-10-CM | POA: Diagnosis not present

## 2017-05-27 DIAGNOSIS — H401132 Primary open-angle glaucoma, bilateral, moderate stage: Secondary | ICD-10-CM | POA: Diagnosis not present

## 2017-05-27 DIAGNOSIS — H26492 Other secondary cataract, left eye: Secondary | ICD-10-CM | POA: Diagnosis not present

## 2017-05-27 DIAGNOSIS — R278 Other lack of coordination: Secondary | ICD-10-CM | POA: Diagnosis not present

## 2017-05-27 DIAGNOSIS — M6281 Muscle weakness (generalized): Secondary | ICD-10-CM | POA: Diagnosis not present

## 2017-05-28 DIAGNOSIS — R278 Other lack of coordination: Secondary | ICD-10-CM | POA: Diagnosis not present

## 2017-05-28 DIAGNOSIS — M6281 Muscle weakness (generalized): Secondary | ICD-10-CM | POA: Diagnosis not present

## 2017-05-28 DIAGNOSIS — R293 Abnormal posture: Secondary | ICD-10-CM | POA: Diagnosis not present

## 2017-05-28 DIAGNOSIS — H8143 Vertigo of central origin, bilateral: Secondary | ICD-10-CM | POA: Diagnosis not present

## 2017-05-29 DIAGNOSIS — H34831 Tributary (branch) retinal vein occlusion, right eye, with macular edema: Secondary | ICD-10-CM | POA: Diagnosis not present

## 2017-05-29 DIAGNOSIS — H353121 Nonexudative age-related macular degeneration, left eye, early dry stage: Secondary | ICD-10-CM | POA: Diagnosis not present

## 2017-05-29 DIAGNOSIS — M6281 Muscle weakness (generalized): Secondary | ICD-10-CM | POA: Diagnosis not present

## 2017-05-29 DIAGNOSIS — H43813 Vitreous degeneration, bilateral: Secondary | ICD-10-CM | POA: Diagnosis not present

## 2017-05-29 DIAGNOSIS — H8143 Vertigo of central origin, bilateral: Secondary | ICD-10-CM | POA: Diagnosis not present

## 2017-05-29 DIAGNOSIS — R278 Other lack of coordination: Secondary | ICD-10-CM | POA: Diagnosis not present

## 2017-05-29 DIAGNOSIS — H35371 Puckering of macula, right eye: Secondary | ICD-10-CM | POA: Diagnosis not present

## 2017-05-29 DIAGNOSIS — R293 Abnormal posture: Secondary | ICD-10-CM | POA: Diagnosis not present

## 2017-05-30 DIAGNOSIS — M6281 Muscle weakness (generalized): Secondary | ICD-10-CM | POA: Diagnosis not present

## 2017-05-30 DIAGNOSIS — R293 Abnormal posture: Secondary | ICD-10-CM | POA: Diagnosis not present

## 2017-05-30 DIAGNOSIS — R278 Other lack of coordination: Secondary | ICD-10-CM | POA: Diagnosis not present

## 2017-05-30 DIAGNOSIS — H8143 Vertigo of central origin, bilateral: Secondary | ICD-10-CM | POA: Diagnosis not present

## 2017-05-31 DIAGNOSIS — R293 Abnormal posture: Secondary | ICD-10-CM | POA: Diagnosis not present

## 2017-05-31 DIAGNOSIS — R278 Other lack of coordination: Secondary | ICD-10-CM | POA: Diagnosis not present

## 2017-05-31 DIAGNOSIS — M6281 Muscle weakness (generalized): Secondary | ICD-10-CM | POA: Diagnosis not present

## 2017-05-31 DIAGNOSIS — H8143 Vertigo of central origin, bilateral: Secondary | ICD-10-CM | POA: Diagnosis not present

## 2017-06-01 DIAGNOSIS — H8143 Vertigo of central origin, bilateral: Secondary | ICD-10-CM | POA: Diagnosis not present

## 2017-06-01 DIAGNOSIS — R293 Abnormal posture: Secondary | ICD-10-CM | POA: Diagnosis not present

## 2017-06-01 DIAGNOSIS — M6281 Muscle weakness (generalized): Secondary | ICD-10-CM | POA: Diagnosis not present

## 2017-06-01 DIAGNOSIS — R278 Other lack of coordination: Secondary | ICD-10-CM | POA: Diagnosis not present

## 2017-06-03 DIAGNOSIS — H8143 Vertigo of central origin, bilateral: Secondary | ICD-10-CM | POA: Diagnosis not present

## 2017-06-03 DIAGNOSIS — R278 Other lack of coordination: Secondary | ICD-10-CM | POA: Diagnosis not present

## 2017-06-03 DIAGNOSIS — M6281 Muscle weakness (generalized): Secondary | ICD-10-CM | POA: Diagnosis not present

## 2017-06-03 DIAGNOSIS — R293 Abnormal posture: Secondary | ICD-10-CM | POA: Diagnosis not present

## 2017-06-04 DIAGNOSIS — H8143 Vertigo of central origin, bilateral: Secondary | ICD-10-CM | POA: Diagnosis not present

## 2017-06-04 DIAGNOSIS — M6281 Muscle weakness (generalized): Secondary | ICD-10-CM | POA: Diagnosis not present

## 2017-06-04 DIAGNOSIS — R293 Abnormal posture: Secondary | ICD-10-CM | POA: Diagnosis not present

## 2017-06-04 DIAGNOSIS — R278 Other lack of coordination: Secondary | ICD-10-CM | POA: Diagnosis not present

## 2017-06-05 DIAGNOSIS — H8143 Vertigo of central origin, bilateral: Secondary | ICD-10-CM | POA: Diagnosis not present

## 2017-06-05 DIAGNOSIS — R278 Other lack of coordination: Secondary | ICD-10-CM | POA: Diagnosis not present

## 2017-06-05 DIAGNOSIS — R293 Abnormal posture: Secondary | ICD-10-CM | POA: Diagnosis not present

## 2017-06-05 DIAGNOSIS — M6281 Muscle weakness (generalized): Secondary | ICD-10-CM | POA: Diagnosis not present

## 2017-06-14 DIAGNOSIS — H34831 Tributary (branch) retinal vein occlusion, right eye, with macular edema: Secondary | ICD-10-CM | POA: Diagnosis not present

## 2017-06-20 DIAGNOSIS — R278 Other lack of coordination: Secondary | ICD-10-CM | POA: Diagnosis not present

## 2017-06-20 DIAGNOSIS — R296 Repeated falls: Secondary | ICD-10-CM | POA: Diagnosis not present

## 2017-06-20 DIAGNOSIS — R2689 Other abnormalities of gait and mobility: Secondary | ICD-10-CM | POA: Diagnosis not present

## 2017-06-20 DIAGNOSIS — M6281 Muscle weakness (generalized): Secondary | ICD-10-CM | POA: Diagnosis not present

## 2017-06-25 DIAGNOSIS — R2689 Other abnormalities of gait and mobility: Secondary | ICD-10-CM | POA: Diagnosis not present

## 2017-06-25 DIAGNOSIS — M6281 Muscle weakness (generalized): Secondary | ICD-10-CM | POA: Diagnosis not present

## 2017-06-25 DIAGNOSIS — R278 Other lack of coordination: Secondary | ICD-10-CM | POA: Diagnosis not present

## 2017-06-25 DIAGNOSIS — R296 Repeated falls: Secondary | ICD-10-CM | POA: Diagnosis not present

## 2017-06-27 DIAGNOSIS — R278 Other lack of coordination: Secondary | ICD-10-CM | POA: Diagnosis not present

## 2017-06-27 DIAGNOSIS — R2689 Other abnormalities of gait and mobility: Secondary | ICD-10-CM | POA: Diagnosis not present

## 2017-06-27 DIAGNOSIS — M6281 Muscle weakness (generalized): Secondary | ICD-10-CM | POA: Diagnosis not present

## 2017-06-27 DIAGNOSIS — R296 Repeated falls: Secondary | ICD-10-CM | POA: Diagnosis not present

## 2017-07-02 DIAGNOSIS — H26492 Other secondary cataract, left eye: Secondary | ICD-10-CM | POA: Diagnosis not present

## 2017-07-02 DIAGNOSIS — R296 Repeated falls: Secondary | ICD-10-CM | POA: Diagnosis not present

## 2017-07-02 DIAGNOSIS — M6281 Muscle weakness (generalized): Secondary | ICD-10-CM | POA: Diagnosis not present

## 2017-07-02 DIAGNOSIS — R278 Other lack of coordination: Secondary | ICD-10-CM | POA: Diagnosis not present

## 2017-07-02 DIAGNOSIS — R2689 Other abnormalities of gait and mobility: Secondary | ICD-10-CM | POA: Diagnosis not present

## 2017-07-05 DIAGNOSIS — R296 Repeated falls: Secondary | ICD-10-CM | POA: Diagnosis not present

## 2017-07-05 DIAGNOSIS — R2689 Other abnormalities of gait and mobility: Secondary | ICD-10-CM | POA: Diagnosis not present

## 2017-07-05 DIAGNOSIS — R278 Other lack of coordination: Secondary | ICD-10-CM | POA: Diagnosis not present

## 2017-07-05 DIAGNOSIS — M6281 Muscle weakness (generalized): Secondary | ICD-10-CM | POA: Diagnosis not present

## 2017-07-09 DIAGNOSIS — R278 Other lack of coordination: Secondary | ICD-10-CM | POA: Diagnosis not present

## 2017-07-09 DIAGNOSIS — R296 Repeated falls: Secondary | ICD-10-CM | POA: Diagnosis not present

## 2017-07-09 DIAGNOSIS — R2689 Other abnormalities of gait and mobility: Secondary | ICD-10-CM | POA: Diagnosis not present

## 2017-07-09 DIAGNOSIS — M6281 Muscle weakness (generalized): Secondary | ICD-10-CM | POA: Diagnosis not present

## 2017-07-11 DIAGNOSIS — R278 Other lack of coordination: Secondary | ICD-10-CM | POA: Diagnosis not present

## 2017-07-11 DIAGNOSIS — M6281 Muscle weakness (generalized): Secondary | ICD-10-CM | POA: Diagnosis not present

## 2017-07-11 DIAGNOSIS — R296 Repeated falls: Secondary | ICD-10-CM | POA: Diagnosis not present

## 2017-07-11 DIAGNOSIS — R2689 Other abnormalities of gait and mobility: Secondary | ICD-10-CM | POA: Diagnosis not present

## 2017-07-16 DIAGNOSIS — M6281 Muscle weakness (generalized): Secondary | ICD-10-CM | POA: Diagnosis not present

## 2017-07-16 DIAGNOSIS — R278 Other lack of coordination: Secondary | ICD-10-CM | POA: Diagnosis not present

## 2017-07-16 DIAGNOSIS — R2689 Other abnormalities of gait and mobility: Secondary | ICD-10-CM | POA: Diagnosis not present

## 2017-07-16 DIAGNOSIS — R296 Repeated falls: Secondary | ICD-10-CM | POA: Diagnosis not present

## 2017-07-18 DIAGNOSIS — R278 Other lack of coordination: Secondary | ICD-10-CM | POA: Diagnosis not present

## 2017-07-18 DIAGNOSIS — R2689 Other abnormalities of gait and mobility: Secondary | ICD-10-CM | POA: Diagnosis not present

## 2017-07-18 DIAGNOSIS — M6281 Muscle weakness (generalized): Secondary | ICD-10-CM | POA: Diagnosis not present

## 2017-07-18 DIAGNOSIS — R296 Repeated falls: Secondary | ICD-10-CM | POA: Diagnosis not present

## 2017-07-23 DIAGNOSIS — R278 Other lack of coordination: Secondary | ICD-10-CM | POA: Diagnosis not present

## 2017-07-23 DIAGNOSIS — M6281 Muscle weakness (generalized): Secondary | ICD-10-CM | POA: Diagnosis not present

## 2017-07-23 DIAGNOSIS — R2689 Other abnormalities of gait and mobility: Secondary | ICD-10-CM | POA: Diagnosis not present

## 2017-07-23 DIAGNOSIS — R296 Repeated falls: Secondary | ICD-10-CM | POA: Diagnosis not present

## 2017-07-23 DIAGNOSIS — M79604 Pain in right leg: Secondary | ICD-10-CM | POA: Diagnosis not present

## 2017-07-25 DIAGNOSIS — R278 Other lack of coordination: Secondary | ICD-10-CM | POA: Diagnosis not present

## 2017-07-25 DIAGNOSIS — M6281 Muscle weakness (generalized): Secondary | ICD-10-CM | POA: Diagnosis not present

## 2017-07-25 DIAGNOSIS — R2689 Other abnormalities of gait and mobility: Secondary | ICD-10-CM | POA: Diagnosis not present

## 2017-07-25 DIAGNOSIS — M79604 Pain in right leg: Secondary | ICD-10-CM | POA: Diagnosis not present

## 2017-07-25 DIAGNOSIS — R296 Repeated falls: Secondary | ICD-10-CM | POA: Diagnosis not present

## 2017-07-30 DIAGNOSIS — R278 Other lack of coordination: Secondary | ICD-10-CM | POA: Diagnosis not present

## 2017-07-30 DIAGNOSIS — M6281 Muscle weakness (generalized): Secondary | ICD-10-CM | POA: Diagnosis not present

## 2017-07-30 DIAGNOSIS — R296 Repeated falls: Secondary | ICD-10-CM | POA: Diagnosis not present

## 2017-07-30 DIAGNOSIS — R2689 Other abnormalities of gait and mobility: Secondary | ICD-10-CM | POA: Diagnosis not present

## 2017-07-30 DIAGNOSIS — M79604 Pain in right leg: Secondary | ICD-10-CM | POA: Diagnosis not present

## 2017-08-01 DIAGNOSIS — M6281 Muscle weakness (generalized): Secondary | ICD-10-CM | POA: Diagnosis not present

## 2017-08-01 DIAGNOSIS — R2689 Other abnormalities of gait and mobility: Secondary | ICD-10-CM | POA: Diagnosis not present

## 2017-08-01 DIAGNOSIS — R278 Other lack of coordination: Secondary | ICD-10-CM | POA: Diagnosis not present

## 2017-08-01 DIAGNOSIS — M79604 Pain in right leg: Secondary | ICD-10-CM | POA: Diagnosis not present

## 2017-08-01 DIAGNOSIS — R296 Repeated falls: Secondary | ICD-10-CM | POA: Diagnosis not present

## 2017-08-06 DIAGNOSIS — M6281 Muscle weakness (generalized): Secondary | ICD-10-CM | POA: Diagnosis not present

## 2017-08-06 DIAGNOSIS — R2689 Other abnormalities of gait and mobility: Secondary | ICD-10-CM | POA: Diagnosis not present

## 2017-08-06 DIAGNOSIS — R296 Repeated falls: Secondary | ICD-10-CM | POA: Diagnosis not present

## 2017-08-06 DIAGNOSIS — R278 Other lack of coordination: Secondary | ICD-10-CM | POA: Diagnosis not present

## 2017-08-06 DIAGNOSIS — M79604 Pain in right leg: Secondary | ICD-10-CM | POA: Diagnosis not present

## 2017-08-08 DIAGNOSIS — R278 Other lack of coordination: Secondary | ICD-10-CM | POA: Diagnosis not present

## 2017-08-08 DIAGNOSIS — M6281 Muscle weakness (generalized): Secondary | ICD-10-CM | POA: Diagnosis not present

## 2017-08-08 DIAGNOSIS — M79604 Pain in right leg: Secondary | ICD-10-CM | POA: Diagnosis not present

## 2017-08-08 DIAGNOSIS — R2689 Other abnormalities of gait and mobility: Secondary | ICD-10-CM | POA: Diagnosis not present

## 2017-08-08 DIAGNOSIS — R296 Repeated falls: Secondary | ICD-10-CM | POA: Diagnosis not present

## 2017-08-19 DIAGNOSIS — Z136 Encounter for screening for cardiovascular disorders: Secondary | ICD-10-CM | POA: Diagnosis not present

## 2017-08-19 DIAGNOSIS — Z1389 Encounter for screening for other disorder: Secondary | ICD-10-CM | POA: Diagnosis not present

## 2017-08-19 DIAGNOSIS — E785 Hyperlipidemia, unspecified: Secondary | ICD-10-CM | POA: Diagnosis not present

## 2017-08-19 DIAGNOSIS — Z9181 History of falling: Secondary | ICD-10-CM | POA: Diagnosis not present

## 2017-08-19 DIAGNOSIS — Z0000 Encounter for general adult medical examination without abnormal findings: Secondary | ICD-10-CM | POA: Diagnosis not present

## 2017-08-23 DIAGNOSIS — B351 Tinea unguium: Secondary | ICD-10-CM | POA: Diagnosis not present

## 2017-08-23 DIAGNOSIS — M79675 Pain in left toe(s): Secondary | ICD-10-CM | POA: Diagnosis not present

## 2017-08-30 DIAGNOSIS — M7061 Trochanteric bursitis, right hip: Secondary | ICD-10-CM | POA: Diagnosis not present

## 2017-09-04 DIAGNOSIS — R112 Nausea with vomiting, unspecified: Secondary | ICD-10-CM | POA: Diagnosis not present

## 2017-09-04 DIAGNOSIS — E785 Hyperlipidemia, unspecified: Secondary | ICD-10-CM | POA: Diagnosis not present

## 2017-09-04 DIAGNOSIS — E860 Dehydration: Secondary | ICD-10-CM | POA: Diagnosis not present

## 2017-09-04 DIAGNOSIS — I10 Essential (primary) hypertension: Secondary | ICD-10-CM | POA: Diagnosis not present

## 2017-09-04 DIAGNOSIS — H8110 Benign paroxysmal vertigo, unspecified ear: Secondary | ICD-10-CM | POA: Diagnosis not present

## 2017-09-04 DIAGNOSIS — R2689 Other abnormalities of gait and mobility: Secondary | ICD-10-CM | POA: Diagnosis not present

## 2017-09-04 DIAGNOSIS — K5900 Constipation, unspecified: Secondary | ICD-10-CM | POA: Diagnosis not present

## 2017-09-04 DIAGNOSIS — R55 Syncope and collapse: Secondary | ICD-10-CM | POA: Diagnosis not present

## 2017-09-04 DIAGNOSIS — M6281 Muscle weakness (generalized): Secondary | ICD-10-CM | POA: Diagnosis not present

## 2017-09-04 DIAGNOSIS — R404 Transient alteration of awareness: Secondary | ICD-10-CM | POA: Diagnosis not present

## 2017-09-04 DIAGNOSIS — R42 Dizziness and giddiness: Secondary | ICD-10-CM | POA: Diagnosis not present

## 2017-09-05 DIAGNOSIS — E860 Dehydration: Secondary | ICD-10-CM | POA: Diagnosis not present

## 2017-09-05 DIAGNOSIS — R42 Dizziness and giddiness: Secondary | ICD-10-CM | POA: Diagnosis not present

## 2017-09-05 DIAGNOSIS — R112 Nausea with vomiting, unspecified: Secondary | ICD-10-CM | POA: Diagnosis not present

## 2017-09-05 DIAGNOSIS — I10 Essential (primary) hypertension: Secondary | ICD-10-CM | POA: Diagnosis not present

## 2017-09-06 DIAGNOSIS — I10 Essential (primary) hypertension: Secondary | ICD-10-CM | POA: Diagnosis not present

## 2017-09-06 DIAGNOSIS — R42 Dizziness and giddiness: Secondary | ICD-10-CM | POA: Diagnosis not present

## 2017-09-06 DIAGNOSIS — R112 Nausea with vomiting, unspecified: Secondary | ICD-10-CM | POA: Diagnosis not present

## 2017-09-06 DIAGNOSIS — E860 Dehydration: Secondary | ICD-10-CM | POA: Diagnosis not present

## 2017-09-07 DIAGNOSIS — R531 Weakness: Secondary | ICD-10-CM | POA: Diagnosis present

## 2017-09-07 DIAGNOSIS — K5900 Constipation, unspecified: Secondary | ICD-10-CM | POA: Diagnosis not present

## 2017-09-07 DIAGNOSIS — R262 Difficulty in walking, not elsewhere classified: Secondary | ICD-10-CM | POA: Diagnosis present

## 2017-09-07 DIAGNOSIS — Z83511 Family history of glaucoma: Secondary | ICD-10-CM | POA: Diagnosis not present

## 2017-09-07 DIAGNOSIS — E785 Hyperlipidemia, unspecified: Secondary | ICD-10-CM | POA: Diagnosis present

## 2017-09-07 DIAGNOSIS — I10 Essential (primary) hypertension: Secondary | ICD-10-CM | POA: Diagnosis present

## 2017-09-07 DIAGNOSIS — R4182 Altered mental status, unspecified: Secondary | ICD-10-CM | POA: Diagnosis not present

## 2017-09-07 DIAGNOSIS — H8110 Benign paroxysmal vertigo, unspecified ear: Secondary | ICD-10-CM | POA: Diagnosis present

## 2017-09-07 DIAGNOSIS — R112 Nausea with vomiting, unspecified: Secondary | ICD-10-CM | POA: Diagnosis present

## 2017-09-07 DIAGNOSIS — R42 Dizziness and giddiness: Secondary | ICD-10-CM | POA: Diagnosis not present

## 2017-09-07 DIAGNOSIS — R110 Nausea: Secondary | ICD-10-CM | POA: Diagnosis not present

## 2017-09-07 DIAGNOSIS — Z8249 Family history of ischemic heart disease and other diseases of the circulatory system: Secondary | ICD-10-CM | POA: Diagnosis not present

## 2017-09-07 DIAGNOSIS — G43A1 Cyclical vomiting, intractable: Secondary | ICD-10-CM | POA: Diagnosis not present

## 2017-09-07 DIAGNOSIS — E860 Dehydration: Secondary | ICD-10-CM | POA: Diagnosis present

## 2017-09-11 DIAGNOSIS — R293 Abnormal posture: Secondary | ICD-10-CM | POA: Diagnosis not present

## 2017-09-11 DIAGNOSIS — R278 Other lack of coordination: Secondary | ICD-10-CM | POA: Diagnosis not present

## 2017-09-11 DIAGNOSIS — M6281 Muscle weakness (generalized): Secondary | ICD-10-CM | POA: Diagnosis not present

## 2017-09-11 DIAGNOSIS — R2689 Other abnormalities of gait and mobility: Secondary | ICD-10-CM | POA: Diagnosis not present

## 2017-09-12 DIAGNOSIS — M6281 Muscle weakness (generalized): Secondary | ICD-10-CM | POA: Diagnosis not present

## 2017-09-12 DIAGNOSIS — R2689 Other abnormalities of gait and mobility: Secondary | ICD-10-CM | POA: Diagnosis not present

## 2017-09-12 DIAGNOSIS — R293 Abnormal posture: Secondary | ICD-10-CM | POA: Diagnosis not present

## 2017-09-12 DIAGNOSIS — R278 Other lack of coordination: Secondary | ICD-10-CM | POA: Diagnosis not present

## 2017-09-13 DIAGNOSIS — R293 Abnormal posture: Secondary | ICD-10-CM | POA: Diagnosis not present

## 2017-09-13 DIAGNOSIS — I10 Essential (primary) hypertension: Secondary | ICD-10-CM | POA: Diagnosis not present

## 2017-09-13 DIAGNOSIS — M6281 Muscle weakness (generalized): Secondary | ICD-10-CM | POA: Diagnosis not present

## 2017-09-13 DIAGNOSIS — R2689 Other abnormalities of gait and mobility: Secondary | ICD-10-CM | POA: Diagnosis not present

## 2017-09-13 DIAGNOSIS — R112 Nausea with vomiting, unspecified: Secondary | ICD-10-CM | POA: Diagnosis not present

## 2017-09-13 DIAGNOSIS — J309 Allergic rhinitis, unspecified: Secondary | ICD-10-CM | POA: Diagnosis not present

## 2017-09-13 DIAGNOSIS — H8110 Benign paroxysmal vertigo, unspecified ear: Secondary | ICD-10-CM | POA: Diagnosis not present

## 2017-09-13 DIAGNOSIS — R278 Other lack of coordination: Secondary | ICD-10-CM | POA: Diagnosis not present

## 2017-09-16 DIAGNOSIS — M6281 Muscle weakness (generalized): Secondary | ICD-10-CM | POA: Diagnosis not present

## 2017-09-16 DIAGNOSIS — R2689 Other abnormalities of gait and mobility: Secondary | ICD-10-CM | POA: Diagnosis not present

## 2017-09-16 DIAGNOSIS — R278 Other lack of coordination: Secondary | ICD-10-CM | POA: Diagnosis not present

## 2017-09-16 DIAGNOSIS — R293 Abnormal posture: Secondary | ICD-10-CM | POA: Diagnosis not present

## 2017-09-17 DIAGNOSIS — R2689 Other abnormalities of gait and mobility: Secondary | ICD-10-CM | POA: Diagnosis not present

## 2017-09-17 DIAGNOSIS — M6281 Muscle weakness (generalized): Secondary | ICD-10-CM | POA: Diagnosis not present

## 2017-09-17 DIAGNOSIS — R278 Other lack of coordination: Secondary | ICD-10-CM | POA: Diagnosis not present

## 2017-09-17 DIAGNOSIS — R293 Abnormal posture: Secondary | ICD-10-CM | POA: Diagnosis not present

## 2017-09-18 DIAGNOSIS — M6281 Muscle weakness (generalized): Secondary | ICD-10-CM | POA: Diagnosis not present

## 2017-09-18 DIAGNOSIS — R2689 Other abnormalities of gait and mobility: Secondary | ICD-10-CM | POA: Diagnosis not present

## 2017-09-18 DIAGNOSIS — R293 Abnormal posture: Secondary | ICD-10-CM | POA: Diagnosis not present

## 2017-09-18 DIAGNOSIS — R278 Other lack of coordination: Secondary | ICD-10-CM | POA: Diagnosis not present

## 2017-09-19 DIAGNOSIS — R278 Other lack of coordination: Secondary | ICD-10-CM | POA: Diagnosis not present

## 2017-09-19 DIAGNOSIS — R293 Abnormal posture: Secondary | ICD-10-CM | POA: Diagnosis not present

## 2017-09-19 DIAGNOSIS — R2689 Other abnormalities of gait and mobility: Secondary | ICD-10-CM | POA: Diagnosis not present

## 2017-09-19 DIAGNOSIS — M6281 Muscle weakness (generalized): Secondary | ICD-10-CM | POA: Diagnosis not present

## 2017-09-20 DIAGNOSIS — R293 Abnormal posture: Secondary | ICD-10-CM | POA: Diagnosis not present

## 2017-09-20 DIAGNOSIS — M6281 Muscle weakness (generalized): Secondary | ICD-10-CM | POA: Diagnosis not present

## 2017-09-20 DIAGNOSIS — R278 Other lack of coordination: Secondary | ICD-10-CM | POA: Diagnosis not present

## 2017-09-20 DIAGNOSIS — R2689 Other abnormalities of gait and mobility: Secondary | ICD-10-CM | POA: Diagnosis not present

## 2017-09-23 DIAGNOSIS — H838X3 Other specified diseases of inner ear, bilateral: Secondary | ICD-10-CM | POA: Diagnosis not present

## 2017-09-23 DIAGNOSIS — R2689 Other abnormalities of gait and mobility: Secondary | ICD-10-CM | POA: Diagnosis not present

## 2017-09-23 DIAGNOSIS — H8110 Benign paroxysmal vertigo, unspecified ear: Secondary | ICD-10-CM | POA: Diagnosis not present

## 2017-09-23 DIAGNOSIS — R293 Abnormal posture: Secondary | ICD-10-CM | POA: Diagnosis not present

## 2017-09-23 DIAGNOSIS — R0982 Postnasal drip: Secondary | ICD-10-CM | POA: Diagnosis not present

## 2017-09-23 DIAGNOSIS — H9193 Unspecified hearing loss, bilateral: Secondary | ICD-10-CM | POA: Diagnosis not present

## 2017-09-23 DIAGNOSIS — M6281 Muscle weakness (generalized): Secondary | ICD-10-CM | POA: Diagnosis not present

## 2017-09-23 DIAGNOSIS — R278 Other lack of coordination: Secondary | ICD-10-CM | POA: Diagnosis not present

## 2017-09-24 DIAGNOSIS — R278 Other lack of coordination: Secondary | ICD-10-CM | POA: Diagnosis not present

## 2017-09-24 DIAGNOSIS — R293 Abnormal posture: Secondary | ICD-10-CM | POA: Diagnosis not present

## 2017-09-24 DIAGNOSIS — R2689 Other abnormalities of gait and mobility: Secondary | ICD-10-CM | POA: Diagnosis not present

## 2017-09-24 DIAGNOSIS — M6281 Muscle weakness (generalized): Secondary | ICD-10-CM | POA: Diagnosis not present

## 2017-09-26 DIAGNOSIS — R293 Abnormal posture: Secondary | ICD-10-CM | POA: Diagnosis not present

## 2017-09-26 DIAGNOSIS — M6281 Muscle weakness (generalized): Secondary | ICD-10-CM | POA: Diagnosis not present

## 2017-09-26 DIAGNOSIS — R278 Other lack of coordination: Secondary | ICD-10-CM | POA: Diagnosis not present

## 2017-09-26 DIAGNOSIS — R2689 Other abnormalities of gait and mobility: Secondary | ICD-10-CM | POA: Diagnosis not present

## 2017-10-01 DIAGNOSIS — M6281 Muscle weakness (generalized): Secondary | ICD-10-CM | POA: Diagnosis not present

## 2017-10-01 DIAGNOSIS — M79604 Pain in right leg: Secondary | ICD-10-CM | POA: Diagnosis not present

## 2017-10-01 DIAGNOSIS — R2689 Other abnormalities of gait and mobility: Secondary | ICD-10-CM | POA: Diagnosis not present

## 2017-10-01 DIAGNOSIS — R278 Other lack of coordination: Secondary | ICD-10-CM | POA: Diagnosis not present

## 2017-10-03 DIAGNOSIS — R2689 Other abnormalities of gait and mobility: Secondary | ICD-10-CM | POA: Diagnosis not present

## 2017-10-03 DIAGNOSIS — M6281 Muscle weakness (generalized): Secondary | ICD-10-CM | POA: Diagnosis not present

## 2017-10-03 DIAGNOSIS — R278 Other lack of coordination: Secondary | ICD-10-CM | POA: Diagnosis not present

## 2017-10-03 DIAGNOSIS — M79604 Pain in right leg: Secondary | ICD-10-CM | POA: Diagnosis not present

## 2017-10-07 DIAGNOSIS — M6281 Muscle weakness (generalized): Secondary | ICD-10-CM | POA: Diagnosis not present

## 2017-10-07 DIAGNOSIS — M79604 Pain in right leg: Secondary | ICD-10-CM | POA: Diagnosis not present

## 2017-10-07 DIAGNOSIS — R278 Other lack of coordination: Secondary | ICD-10-CM | POA: Diagnosis not present

## 2017-10-07 DIAGNOSIS — R2689 Other abnormalities of gait and mobility: Secondary | ICD-10-CM | POA: Diagnosis not present

## 2017-10-08 DIAGNOSIS — H52203 Unspecified astigmatism, bilateral: Secondary | ICD-10-CM | POA: Diagnosis not present

## 2017-10-08 DIAGNOSIS — H353121 Nonexudative age-related macular degeneration, left eye, early dry stage: Secondary | ICD-10-CM | POA: Diagnosis not present

## 2017-10-08 DIAGNOSIS — H35371 Puckering of macula, right eye: Secondary | ICD-10-CM | POA: Diagnosis not present

## 2017-10-08 DIAGNOSIS — H34831 Tributary (branch) retinal vein occlusion, right eye, with macular edema: Secondary | ICD-10-CM | POA: Diagnosis not present

## 2017-10-08 DIAGNOSIS — H401132 Primary open-angle glaucoma, bilateral, moderate stage: Secondary | ICD-10-CM | POA: Diagnosis not present

## 2017-10-08 DIAGNOSIS — Z961 Presence of intraocular lens: Secondary | ICD-10-CM | POA: Diagnosis not present

## 2017-10-08 DIAGNOSIS — H04123 Dry eye syndrome of bilateral lacrimal glands: Secondary | ICD-10-CM | POA: Diagnosis not present

## 2017-10-08 DIAGNOSIS — H524 Presbyopia: Secondary | ICD-10-CM | POA: Diagnosis not present

## 2017-10-08 DIAGNOSIS — H5203 Hypermetropia, bilateral: Secondary | ICD-10-CM | POA: Diagnosis not present

## 2017-10-09 DIAGNOSIS — R2689 Other abnormalities of gait and mobility: Secondary | ICD-10-CM | POA: Diagnosis not present

## 2017-10-09 DIAGNOSIS — R278 Other lack of coordination: Secondary | ICD-10-CM | POA: Diagnosis not present

## 2017-10-09 DIAGNOSIS — M6281 Muscle weakness (generalized): Secondary | ICD-10-CM | POA: Diagnosis not present

## 2017-10-09 DIAGNOSIS — M79604 Pain in right leg: Secondary | ICD-10-CM | POA: Diagnosis not present

## 2017-10-14 DIAGNOSIS — R278 Other lack of coordination: Secondary | ICD-10-CM | POA: Diagnosis not present

## 2017-10-14 DIAGNOSIS — R2689 Other abnormalities of gait and mobility: Secondary | ICD-10-CM | POA: Diagnosis not present

## 2017-10-14 DIAGNOSIS — M79604 Pain in right leg: Secondary | ICD-10-CM | POA: Diagnosis not present

## 2017-10-14 DIAGNOSIS — M6281 Muscle weakness (generalized): Secondary | ICD-10-CM | POA: Diagnosis not present

## 2017-10-18 DIAGNOSIS — M79604 Pain in right leg: Secondary | ICD-10-CM | POA: Diagnosis not present

## 2017-10-18 DIAGNOSIS — R2689 Other abnormalities of gait and mobility: Secondary | ICD-10-CM | POA: Diagnosis not present

## 2017-10-18 DIAGNOSIS — L719 Rosacea, unspecified: Secondary | ICD-10-CM | POA: Diagnosis not present

## 2017-10-18 DIAGNOSIS — Z6824 Body mass index (BMI) 24.0-24.9, adult: Secondary | ICD-10-CM | POA: Diagnosis not present

## 2017-10-18 DIAGNOSIS — C641 Malignant neoplasm of right kidney, except renal pelvis: Secondary | ICD-10-CM | POA: Diagnosis not present

## 2017-10-18 DIAGNOSIS — R278 Other lack of coordination: Secondary | ICD-10-CM | POA: Diagnosis not present

## 2017-10-18 DIAGNOSIS — E785 Hyperlipidemia, unspecified: Secondary | ICD-10-CM | POA: Diagnosis not present

## 2017-10-18 DIAGNOSIS — I10 Essential (primary) hypertension: Secondary | ICD-10-CM | POA: Diagnosis not present

## 2017-10-18 DIAGNOSIS — E559 Vitamin D deficiency, unspecified: Secondary | ICD-10-CM | POA: Diagnosis not present

## 2017-10-18 DIAGNOSIS — M6281 Muscle weakness (generalized): Secondary | ICD-10-CM | POA: Diagnosis not present

## 2017-10-21 DIAGNOSIS — R2689 Other abnormalities of gait and mobility: Secondary | ICD-10-CM | POA: Diagnosis not present

## 2017-10-21 DIAGNOSIS — M79604 Pain in right leg: Secondary | ICD-10-CM | POA: Diagnosis not present

## 2017-10-21 DIAGNOSIS — R278 Other lack of coordination: Secondary | ICD-10-CM | POA: Diagnosis not present

## 2017-10-21 DIAGNOSIS — M6281 Muscle weakness (generalized): Secondary | ICD-10-CM | POA: Diagnosis not present

## 2017-10-23 DIAGNOSIS — M6281 Muscle weakness (generalized): Secondary | ICD-10-CM | POA: Diagnosis not present

## 2017-10-23 DIAGNOSIS — M79604 Pain in right leg: Secondary | ICD-10-CM | POA: Diagnosis not present

## 2017-10-23 DIAGNOSIS — R278 Other lack of coordination: Secondary | ICD-10-CM | POA: Diagnosis not present

## 2017-10-23 DIAGNOSIS — R2689 Other abnormalities of gait and mobility: Secondary | ICD-10-CM | POA: Diagnosis not present

## 2017-10-25 DIAGNOSIS — R2689 Other abnormalities of gait and mobility: Secondary | ICD-10-CM | POA: Diagnosis not present

## 2017-10-25 DIAGNOSIS — B351 Tinea unguium: Secondary | ICD-10-CM | POA: Diagnosis not present

## 2017-10-25 DIAGNOSIS — M79674 Pain in right toe(s): Secondary | ICD-10-CM | POA: Diagnosis not present

## 2017-10-25 DIAGNOSIS — M79604 Pain in right leg: Secondary | ICD-10-CM | POA: Diagnosis not present

## 2017-10-25 DIAGNOSIS — M79675 Pain in left toe(s): Secondary | ICD-10-CM | POA: Diagnosis not present

## 2017-10-25 DIAGNOSIS — R278 Other lack of coordination: Secondary | ICD-10-CM | POA: Diagnosis not present

## 2017-10-25 DIAGNOSIS — M6281 Muscle weakness (generalized): Secondary | ICD-10-CM | POA: Diagnosis not present

## 2017-10-31 DIAGNOSIS — R278 Other lack of coordination: Secondary | ICD-10-CM | POA: Diagnosis not present

## 2017-10-31 DIAGNOSIS — R2689 Other abnormalities of gait and mobility: Secondary | ICD-10-CM | POA: Diagnosis not present

## 2017-10-31 DIAGNOSIS — M79604 Pain in right leg: Secondary | ICD-10-CM | POA: Diagnosis not present

## 2017-10-31 DIAGNOSIS — M6281 Muscle weakness (generalized): Secondary | ICD-10-CM | POA: Diagnosis not present

## 2017-11-01 DIAGNOSIS — R2689 Other abnormalities of gait and mobility: Secondary | ICD-10-CM | POA: Diagnosis not present

## 2017-11-01 DIAGNOSIS — R278 Other lack of coordination: Secondary | ICD-10-CM | POA: Diagnosis not present

## 2017-11-01 DIAGNOSIS — M6281 Muscle weakness (generalized): Secondary | ICD-10-CM | POA: Diagnosis not present

## 2017-11-01 DIAGNOSIS — M79604 Pain in right leg: Secondary | ICD-10-CM | POA: Diagnosis not present

## 2017-12-17 DIAGNOSIS — H353121 Nonexudative age-related macular degeneration, left eye, early dry stage: Secondary | ICD-10-CM | POA: Diagnosis not present

## 2017-12-17 DIAGNOSIS — H35041 Retinal micro-aneurysms, unspecified, right eye: Secondary | ICD-10-CM | POA: Diagnosis not present

## 2017-12-17 DIAGNOSIS — H35371 Puckering of macula, right eye: Secondary | ICD-10-CM | POA: Diagnosis not present

## 2017-12-17 DIAGNOSIS — H34831 Tributary (branch) retinal vein occlusion, right eye, with macular edema: Secondary | ICD-10-CM | POA: Diagnosis not present

## 2018-01-17 DIAGNOSIS — H401132 Primary open-angle glaucoma, bilateral, moderate stage: Secondary | ICD-10-CM | POA: Diagnosis not present

## 2018-01-17 DIAGNOSIS — H524 Presbyopia: Secondary | ICD-10-CM | POA: Diagnosis not present

## 2018-01-17 DIAGNOSIS — H34831 Tributary (branch) retinal vein occlusion, right eye, with macular edema: Secondary | ICD-10-CM | POA: Diagnosis not present

## 2018-01-17 DIAGNOSIS — Z83511 Family history of glaucoma: Secondary | ICD-10-CM | POA: Diagnosis not present

## 2018-01-17 DIAGNOSIS — H353121 Nonexudative age-related macular degeneration, left eye, early dry stage: Secondary | ICD-10-CM | POA: Diagnosis not present

## 2018-01-17 DIAGNOSIS — H35371 Puckering of macula, right eye: Secondary | ICD-10-CM | POA: Diagnosis not present

## 2018-01-17 DIAGNOSIS — H04123 Dry eye syndrome of bilateral lacrimal glands: Secondary | ICD-10-CM | POA: Diagnosis not present

## 2018-01-17 DIAGNOSIS — H52203 Unspecified astigmatism, bilateral: Secondary | ICD-10-CM | POA: Diagnosis not present

## 2018-01-20 DIAGNOSIS — N3090 Cystitis, unspecified without hematuria: Secondary | ICD-10-CM | POA: Diagnosis not present

## 2018-01-21 DIAGNOSIS — H34831 Tributary (branch) retinal vein occlusion, right eye, with macular edema: Secondary | ICD-10-CM | POA: Diagnosis not present

## 2018-01-27 DIAGNOSIS — M170 Bilateral primary osteoarthritis of knee: Secondary | ICD-10-CM | POA: Diagnosis not present

## 2018-02-21 DIAGNOSIS — R261 Paralytic gait: Secondary | ICD-10-CM | POA: Diagnosis not present

## 2018-02-21 DIAGNOSIS — R2689 Other abnormalities of gait and mobility: Secondary | ICD-10-CM | POA: Diagnosis not present

## 2018-02-21 DIAGNOSIS — M25559 Pain in unspecified hip: Secondary | ICD-10-CM | POA: Diagnosis not present

## 2018-02-21 DIAGNOSIS — M6281 Muscle weakness (generalized): Secondary | ICD-10-CM | POA: Diagnosis not present

## 2018-02-21 DIAGNOSIS — R278 Other lack of coordination: Secondary | ICD-10-CM | POA: Diagnosis not present

## 2018-02-21 DIAGNOSIS — R269 Unspecified abnormalities of gait and mobility: Secondary | ICD-10-CM | POA: Diagnosis not present

## 2018-02-28 DIAGNOSIS — M79675 Pain in left toe(s): Secondary | ICD-10-CM | POA: Diagnosis not present

## 2018-02-28 DIAGNOSIS — B351 Tinea unguium: Secondary | ICD-10-CM | POA: Diagnosis not present

## 2018-02-28 DIAGNOSIS — M79674 Pain in right toe(s): Secondary | ICD-10-CM | POA: Diagnosis not present

## 2018-03-17 DIAGNOSIS — N390 Urinary tract infection, site not specified: Secondary | ICD-10-CM | POA: Diagnosis not present

## 2018-04-18 DIAGNOSIS — E559 Vitamin D deficiency, unspecified: Secondary | ICD-10-CM | POA: Diagnosis not present

## 2018-04-18 DIAGNOSIS — C641 Malignant neoplasm of right kidney, except renal pelvis: Secondary | ICD-10-CM | POA: Diagnosis not present

## 2018-04-18 DIAGNOSIS — I10 Essential (primary) hypertension: Secondary | ICD-10-CM | POA: Diagnosis not present

## 2018-04-18 DIAGNOSIS — Z6826 Body mass index (BMI) 26.0-26.9, adult: Secondary | ICD-10-CM | POA: Diagnosis not present

## 2018-04-18 DIAGNOSIS — E785 Hyperlipidemia, unspecified: Secondary | ICD-10-CM | POA: Diagnosis not present

## 2018-04-18 DIAGNOSIS — L719 Rosacea, unspecified: Secondary | ICD-10-CM | POA: Diagnosis not present

## 2018-04-22 DIAGNOSIS — H35371 Puckering of macula, right eye: Secondary | ICD-10-CM | POA: Diagnosis not present

## 2018-04-22 DIAGNOSIS — H34831 Tributary (branch) retinal vein occlusion, right eye, with macular edema: Secondary | ICD-10-CM | POA: Diagnosis not present

## 2018-04-22 DIAGNOSIS — H353121 Nonexudative age-related macular degeneration, left eye, early dry stage: Secondary | ICD-10-CM | POA: Diagnosis not present

## 2018-05-02 DIAGNOSIS — M79674 Pain in right toe(s): Secondary | ICD-10-CM | POA: Diagnosis not present

## 2018-05-02 DIAGNOSIS — B351 Tinea unguium: Secondary | ICD-10-CM | POA: Diagnosis not present

## 2018-05-02 DIAGNOSIS — M79675 Pain in left toe(s): Secondary | ICD-10-CM | POA: Diagnosis not present

## 2018-05-07 DIAGNOSIS — H34831 Tributary (branch) retinal vein occlusion, right eye, with macular edema: Secondary | ICD-10-CM | POA: Diagnosis not present

## 2018-05-19 DIAGNOSIS — J019 Acute sinusitis, unspecified: Secondary | ICD-10-CM | POA: Diagnosis not present

## 2018-06-08 DIAGNOSIS — S72392A Other fracture of shaft of left femur, initial encounter for closed fracture: Secondary | ICD-10-CM | POA: Diagnosis not present

## 2018-06-08 DIAGNOSIS — R609 Edema, unspecified: Secondary | ICD-10-CM | POA: Diagnosis not present

## 2018-06-08 DIAGNOSIS — I129 Hypertensive chronic kidney disease with stage 1 through stage 4 chronic kidney disease, or unspecified chronic kidney disease: Secondary | ICD-10-CM | POA: Diagnosis not present

## 2018-06-08 DIAGNOSIS — W010XXA Fall on same level from slipping, tripping and stumbling without subsequent striking against object, initial encounter: Secondary | ICD-10-CM | POA: Diagnosis not present

## 2018-06-08 DIAGNOSIS — N183 Chronic kidney disease, stage 3 (moderate): Secondary | ICD-10-CM | POA: Diagnosis not present

## 2018-06-08 DIAGNOSIS — E785 Hyperlipidemia, unspecified: Secondary | ICD-10-CM | POA: Diagnosis not present

## 2018-06-08 DIAGNOSIS — D62 Acute posthemorrhagic anemia: Secondary | ICD-10-CM | POA: Diagnosis not present

## 2018-06-08 DIAGNOSIS — S80919A Unspecified superficial injury of unspecified knee, initial encounter: Secondary | ICD-10-CM | POA: Diagnosis not present

## 2018-06-08 DIAGNOSIS — Y9301 Activity, walking, marching and hiking: Secondary | ICD-10-CM | POA: Diagnosis not present

## 2018-06-08 DIAGNOSIS — M25562 Pain in left knee: Secondary | ICD-10-CM | POA: Diagnosis not present

## 2018-06-08 DIAGNOSIS — Y999 Unspecified external cause status: Secondary | ICD-10-CM | POA: Diagnosis not present

## 2018-06-08 DIAGNOSIS — S72452A Displaced supracondylar fracture without intracondylar extension of lower end of left femur, initial encounter for closed fracture: Secondary | ICD-10-CM | POA: Diagnosis not present

## 2018-06-08 DIAGNOSIS — I10 Essential (primary) hypertension: Secondary | ICD-10-CM | POA: Diagnosis not present

## 2018-06-08 DIAGNOSIS — Z87891 Personal history of nicotine dependence: Secondary | ICD-10-CM | POA: Diagnosis not present

## 2018-06-08 DIAGNOSIS — R110 Nausea: Secondary | ICD-10-CM | POA: Diagnosis not present

## 2018-06-09 DIAGNOSIS — D62 Acute posthemorrhagic anemia: Secondary | ICD-10-CM | POA: Diagnosis not present

## 2018-06-09 DIAGNOSIS — Z9842 Cataract extraction status, left eye: Secondary | ICD-10-CM | POA: Diagnosis not present

## 2018-06-09 DIAGNOSIS — M61521 Other ossification of muscle, right upper arm: Secondary | ICD-10-CM | POA: Diagnosis not present

## 2018-06-09 DIAGNOSIS — Z85528 Personal history of other malignant neoplasm of kidney: Secondary | ICD-10-CM | POA: Diagnosis not present

## 2018-06-09 DIAGNOSIS — I129 Hypertensive chronic kidney disease with stage 1 through stage 4 chronic kidney disease, or unspecified chronic kidney disease: Secondary | ICD-10-CM | POA: Diagnosis present

## 2018-06-09 DIAGNOSIS — S72452D Displaced supracondylar fracture without intracondylar extension of lower end of left femur, subsequent encounter for closed fracture with routine healing: Secondary | ICD-10-CM | POA: Diagnosis not present

## 2018-06-09 DIAGNOSIS — Z9841 Cataract extraction status, right eye: Secondary | ICD-10-CM | POA: Diagnosis not present

## 2018-06-09 DIAGNOSIS — N183 Chronic kidney disease, stage 3 (moderate): Secondary | ICD-10-CM | POA: Diagnosis present

## 2018-06-09 DIAGNOSIS — R42 Dizziness and giddiness: Secondary | ICD-10-CM | POA: Diagnosis present

## 2018-06-09 DIAGNOSIS — R2242 Localized swelling, mass and lump, left lower limb: Secondary | ICD-10-CM | POA: Diagnosis not present

## 2018-06-09 DIAGNOSIS — R339 Retention of urine, unspecified: Secondary | ICD-10-CM | POA: Diagnosis not present

## 2018-06-09 DIAGNOSIS — K2980 Duodenitis without bleeding: Secondary | ICD-10-CM | POA: Diagnosis not present

## 2018-06-09 DIAGNOSIS — Z7401 Bed confinement status: Secondary | ICD-10-CM | POA: Diagnosis not present

## 2018-06-09 DIAGNOSIS — Z85828 Personal history of other malignant neoplasm of skin: Secondary | ICD-10-CM | POA: Diagnosis not present

## 2018-06-09 DIAGNOSIS — R110 Nausea: Secondary | ICD-10-CM | POA: Diagnosis not present

## 2018-06-09 DIAGNOSIS — Z96641 Presence of right artificial hip joint: Secondary | ICD-10-CM | POA: Diagnosis not present

## 2018-06-09 DIAGNOSIS — M169 Osteoarthritis of hip, unspecified: Secondary | ICD-10-CM | POA: Diagnosis not present

## 2018-06-09 DIAGNOSIS — S72492D Other fracture of lower end of left femur, subsequent encounter for closed fracture with routine healing: Secondary | ICD-10-CM | POA: Diagnosis not present

## 2018-06-09 DIAGNOSIS — Z7982 Long term (current) use of aspirin: Secondary | ICD-10-CM | POA: Diagnosis not present

## 2018-06-09 DIAGNOSIS — K449 Diaphragmatic hernia without obstruction or gangrene: Secondary | ICD-10-CM | POA: Diagnosis not present

## 2018-06-09 DIAGNOSIS — S72452A Displaced supracondylar fracture without intracondylar extension of lower end of left femur, initial encounter for closed fracture: Secondary | ICD-10-CM | POA: Diagnosis present

## 2018-06-09 DIAGNOSIS — I10 Essential (primary) hypertension: Secondary | ICD-10-CM | POA: Diagnosis not present

## 2018-06-09 DIAGNOSIS — H353211 Exudative age-related macular degeneration, right eye, with active choroidal neovascularization: Secondary | ICD-10-CM | POA: Diagnosis present

## 2018-06-09 DIAGNOSIS — N189 Chronic kidney disease, unspecified: Secondary | ICD-10-CM | POA: Diagnosis not present

## 2018-06-09 DIAGNOSIS — H409 Unspecified glaucoma: Secondary | ICD-10-CM | POA: Diagnosis not present

## 2018-06-09 DIAGNOSIS — E7800 Pure hypercholesterolemia, unspecified: Secondary | ICD-10-CM | POA: Diagnosis present

## 2018-06-09 DIAGNOSIS — H353120 Nonexudative age-related macular degeneration, left eye, stage unspecified: Secondary | ICD-10-CM | POA: Diagnosis present

## 2018-06-09 DIAGNOSIS — E876 Hypokalemia: Secondary | ICD-10-CM | POA: Diagnosis not present

## 2018-06-09 DIAGNOSIS — I959 Hypotension, unspecified: Secondary | ICD-10-CM | POA: Diagnosis not present

## 2018-06-09 DIAGNOSIS — R112 Nausea with vomiting, unspecified: Secondary | ICD-10-CM | POA: Diagnosis not present

## 2018-06-09 DIAGNOSIS — Z961 Presence of intraocular lens: Secondary | ICD-10-CM | POA: Diagnosis present

## 2018-06-09 DIAGNOSIS — Z87891 Personal history of nicotine dependence: Secondary | ICD-10-CM | POA: Diagnosis not present

## 2018-06-09 DIAGNOSIS — W19XXXA Unspecified fall, initial encounter: Secondary | ICD-10-CM | POA: Diagnosis not present

## 2018-06-09 DIAGNOSIS — K5900 Constipation, unspecified: Secondary | ICD-10-CM | POA: Diagnosis not present

## 2018-06-09 DIAGNOSIS — H8110 Benign paroxysmal vertigo, unspecified ear: Secondary | ICD-10-CM | POA: Diagnosis not present

## 2018-06-09 DIAGNOSIS — Z9181 History of falling: Secondary | ICD-10-CM | POA: Diagnosis not present

## 2018-06-09 DIAGNOSIS — E785 Hyperlipidemia, unspecified: Secondary | ICD-10-CM | POA: Diagnosis present

## 2018-06-09 DIAGNOSIS — M79605 Pain in left leg: Secondary | ICD-10-CM | POA: Diagnosis not present

## 2018-06-09 DIAGNOSIS — M2550 Pain in unspecified joint: Secondary | ICD-10-CM | POA: Diagnosis not present

## 2018-06-09 DIAGNOSIS — Z7409 Other reduced mobility: Secondary | ICD-10-CM | POA: Diagnosis not present

## 2018-06-09 DIAGNOSIS — E559 Vitamin D deficiency, unspecified: Secondary | ICD-10-CM | POA: Diagnosis not present

## 2018-06-13 DIAGNOSIS — R339 Retention of urine, unspecified: Secondary | ICD-10-CM | POA: Diagnosis not present

## 2018-06-13 DIAGNOSIS — E559 Vitamin D deficiency, unspecified: Secondary | ICD-10-CM | POA: Diagnosis not present

## 2018-06-13 DIAGNOSIS — H409 Unspecified glaucoma: Secondary | ICD-10-CM | POA: Diagnosis not present

## 2018-06-13 DIAGNOSIS — Z85828 Personal history of other malignant neoplasm of skin: Secondary | ICD-10-CM | POA: Diagnosis not present

## 2018-06-13 DIAGNOSIS — S72452A Displaced supracondylar fracture without intracondylar extension of lower end of left femur, initial encounter for closed fracture: Secondary | ICD-10-CM | POA: Diagnosis not present

## 2018-06-13 DIAGNOSIS — R110 Nausea: Secondary | ICD-10-CM | POA: Diagnosis not present

## 2018-06-13 DIAGNOSIS — Z85528 Personal history of other malignant neoplasm of kidney: Secondary | ICD-10-CM | POA: Diagnosis not present

## 2018-06-13 DIAGNOSIS — E7800 Pure hypercholesterolemia, unspecified: Secondary | ICD-10-CM | POA: Diagnosis not present

## 2018-06-13 DIAGNOSIS — E785 Hyperlipidemia, unspecified: Secondary | ICD-10-CM | POA: Diagnosis not present

## 2018-06-13 DIAGNOSIS — K449 Diaphragmatic hernia without obstruction or gangrene: Secondary | ICD-10-CM | POA: Diagnosis not present

## 2018-06-13 DIAGNOSIS — Z7409 Other reduced mobility: Secondary | ICD-10-CM | POA: Diagnosis not present

## 2018-06-13 DIAGNOSIS — I129 Hypertensive chronic kidney disease with stage 1 through stage 4 chronic kidney disease, or unspecified chronic kidney disease: Secondary | ICD-10-CM | POA: Diagnosis not present

## 2018-06-13 DIAGNOSIS — R42 Dizziness and giddiness: Secondary | ICD-10-CM | POA: Diagnosis not present

## 2018-06-13 DIAGNOSIS — M169 Osteoarthritis of hip, unspecified: Secondary | ICD-10-CM | POA: Diagnosis not present

## 2018-06-13 DIAGNOSIS — R112 Nausea with vomiting, unspecified: Secondary | ICD-10-CM | POA: Diagnosis not present

## 2018-06-13 DIAGNOSIS — Z9181 History of falling: Secondary | ICD-10-CM | POA: Diagnosis not present

## 2018-06-13 DIAGNOSIS — K2980 Duodenitis without bleeding: Secondary | ICD-10-CM | POA: Diagnosis not present

## 2018-06-13 DIAGNOSIS — H8110 Benign paroxysmal vertigo, unspecified ear: Secondary | ICD-10-CM | POA: Diagnosis not present

## 2018-06-13 DIAGNOSIS — M61521 Other ossification of muscle, right upper arm: Secondary | ICD-10-CM | POA: Diagnosis not present

## 2018-06-13 DIAGNOSIS — S72402D Unspecified fracture of lower end of left femur, subsequent encounter for closed fracture with routine healing: Secondary | ICD-10-CM | POA: Diagnosis not present

## 2018-06-13 DIAGNOSIS — W19XXXA Unspecified fall, initial encounter: Secondary | ICD-10-CM | POA: Diagnosis not present

## 2018-06-13 DIAGNOSIS — M2550 Pain in unspecified joint: Secondary | ICD-10-CM | POA: Diagnosis not present

## 2018-06-13 DIAGNOSIS — D62 Acute posthemorrhagic anemia: Secondary | ICD-10-CM | POA: Diagnosis not present

## 2018-06-13 DIAGNOSIS — N183 Chronic kidney disease, stage 3 (moderate): Secondary | ICD-10-CM | POA: Diagnosis not present

## 2018-06-13 DIAGNOSIS — K5900 Constipation, unspecified: Secondary | ICD-10-CM | POA: Diagnosis not present

## 2018-06-13 DIAGNOSIS — N189 Chronic kidney disease, unspecified: Secondary | ICD-10-CM | POA: Diagnosis not present

## 2018-06-13 DIAGNOSIS — I959 Hypotension, unspecified: Secondary | ICD-10-CM | POA: Diagnosis not present

## 2018-06-13 DIAGNOSIS — Z96641 Presence of right artificial hip joint: Secondary | ICD-10-CM | POA: Diagnosis not present

## 2018-06-13 DIAGNOSIS — Z7401 Bed confinement status: Secondary | ICD-10-CM | POA: Diagnosis not present

## 2018-06-13 DIAGNOSIS — S72452D Displaced supracondylar fracture without intracondylar extension of lower end of left femur, subsequent encounter for closed fracture with routine healing: Secondary | ICD-10-CM | POA: Diagnosis not present

## 2018-06-13 DIAGNOSIS — I10 Essential (primary) hypertension: Secondary | ICD-10-CM | POA: Diagnosis not present

## 2018-06-16 DIAGNOSIS — S72402D Unspecified fracture of lower end of left femur, subsequent encounter for closed fracture with routine healing: Secondary | ICD-10-CM | POA: Diagnosis not present

## 2018-06-16 DIAGNOSIS — H8110 Benign paroxysmal vertigo, unspecified ear: Secondary | ICD-10-CM | POA: Diagnosis not present

## 2018-06-16 DIAGNOSIS — R339 Retention of urine, unspecified: Secondary | ICD-10-CM | POA: Diagnosis not present

## 2018-06-16 DIAGNOSIS — I10 Essential (primary) hypertension: Secondary | ICD-10-CM | POA: Diagnosis not present

## 2018-07-01 DIAGNOSIS — S72452D Displaced supracondylar fracture without intracondylar extension of lower end of left femur, subsequent encounter for closed fracture with routine healing: Secondary | ICD-10-CM | POA: Diagnosis not present

## 2018-07-25 DIAGNOSIS — H34831 Tributary (branch) retinal vein occlusion, right eye, with macular edema: Secondary | ICD-10-CM | POA: Diagnosis not present

## 2018-07-25 DIAGNOSIS — H35041 Retinal micro-aneurysms, unspecified, right eye: Secondary | ICD-10-CM | POA: Diagnosis not present

## 2018-07-25 DIAGNOSIS — H353121 Nonexudative age-related macular degeneration, left eye, early dry stage: Secondary | ICD-10-CM | POA: Diagnosis not present

## 2018-07-25 DIAGNOSIS — H35371 Puckering of macula, right eye: Secondary | ICD-10-CM | POA: Diagnosis not present

## 2018-07-29 DIAGNOSIS — H353121 Nonexudative age-related macular degeneration, left eye, early dry stage: Secondary | ICD-10-CM | POA: Diagnosis not present

## 2018-07-29 DIAGNOSIS — H35371 Puckering of macula, right eye: Secondary | ICD-10-CM | POA: Diagnosis not present

## 2018-07-29 DIAGNOSIS — H0100A Unspecified blepharitis right eye, upper and lower eyelids: Secondary | ICD-10-CM | POA: Diagnosis not present

## 2018-07-29 DIAGNOSIS — H0288B Meibomian gland dysfunction left eye, upper and lower eyelids: Secondary | ICD-10-CM | POA: Diagnosis not present

## 2018-07-29 DIAGNOSIS — H0288A Meibomian gland dysfunction right eye, upper and lower eyelids: Secondary | ICD-10-CM | POA: Diagnosis not present

## 2018-07-29 DIAGNOSIS — H0100B Unspecified blepharitis left eye, upper and lower eyelids: Secondary | ICD-10-CM | POA: Diagnosis not present

## 2018-07-29 DIAGNOSIS — H02834 Dermatochalasis of left upper eyelid: Secondary | ICD-10-CM | POA: Diagnosis not present

## 2018-07-29 DIAGNOSIS — H02831 Dermatochalasis of right upper eyelid: Secondary | ICD-10-CM | POA: Diagnosis not present

## 2018-07-29 DIAGNOSIS — H348312 Tributary (branch) retinal vein occlusion, right eye, stable: Secondary | ICD-10-CM | POA: Diagnosis not present

## 2018-07-29 DIAGNOSIS — H401132 Primary open-angle glaucoma, bilateral, moderate stage: Secondary | ICD-10-CM | POA: Diagnosis not present

## 2018-07-29 DIAGNOSIS — H04123 Dry eye syndrome of bilateral lacrimal glands: Secondary | ICD-10-CM | POA: Diagnosis not present

## 2018-08-01 DIAGNOSIS — S72452D Displaced supracondylar fracture without intracondylar extension of lower end of left femur, subsequent encounter for closed fracture with routine healing: Secondary | ICD-10-CM | POA: Diagnosis not present

## 2018-08-06 DIAGNOSIS — Z9181 History of falling: Secondary | ICD-10-CM | POA: Diagnosis not present

## 2018-08-06 DIAGNOSIS — R42 Dizziness and giddiness: Secondary | ICD-10-CM | POA: Diagnosis not present

## 2018-08-06 DIAGNOSIS — R0982 Postnasal drip: Secondary | ICD-10-CM | POA: Diagnosis not present

## 2018-08-29 DIAGNOSIS — S42412D Displaced simple supracondylar fracture without intercondylar fracture of left humerus, subsequent encounter for fracture with routine healing: Secondary | ICD-10-CM | POA: Diagnosis not present

## 2018-09-03 DIAGNOSIS — R2689 Other abnormalities of gait and mobility: Secondary | ICD-10-CM | POA: Diagnosis not present

## 2018-09-03 DIAGNOSIS — R293 Abnormal posture: Secondary | ICD-10-CM | POA: Diagnosis not present

## 2018-09-03 DIAGNOSIS — Z9181 History of falling: Secondary | ICD-10-CM | POA: Diagnosis not present

## 2018-09-03 DIAGNOSIS — M6281 Muscle weakness (generalized): Secondary | ICD-10-CM | POA: Diagnosis not present

## 2018-09-03 DIAGNOSIS — R2681 Unsteadiness on feet: Secondary | ICD-10-CM | POA: Diagnosis not present

## 2018-09-04 DIAGNOSIS — R293 Abnormal posture: Secondary | ICD-10-CM | POA: Diagnosis not present

## 2018-09-04 DIAGNOSIS — R2681 Unsteadiness on feet: Secondary | ICD-10-CM | POA: Diagnosis not present

## 2018-09-04 DIAGNOSIS — M6281 Muscle weakness (generalized): Secondary | ICD-10-CM | POA: Diagnosis not present

## 2018-09-04 DIAGNOSIS — R2689 Other abnormalities of gait and mobility: Secondary | ICD-10-CM | POA: Diagnosis not present

## 2018-09-04 DIAGNOSIS — Z9181 History of falling: Secondary | ICD-10-CM | POA: Diagnosis not present

## 2018-09-05 DIAGNOSIS — M6281 Muscle weakness (generalized): Secondary | ICD-10-CM | POA: Diagnosis not present

## 2018-09-05 DIAGNOSIS — Z9181 History of falling: Secondary | ICD-10-CM | POA: Diagnosis not present

## 2018-09-05 DIAGNOSIS — R2681 Unsteadiness on feet: Secondary | ICD-10-CM | POA: Diagnosis not present

## 2018-09-05 DIAGNOSIS — R293 Abnormal posture: Secondary | ICD-10-CM | POA: Diagnosis not present

## 2018-09-05 DIAGNOSIS — R2689 Other abnormalities of gait and mobility: Secondary | ICD-10-CM | POA: Diagnosis not present

## 2018-09-06 DIAGNOSIS — Z9181 History of falling: Secondary | ICD-10-CM | POA: Diagnosis not present

## 2018-09-06 DIAGNOSIS — M6281 Muscle weakness (generalized): Secondary | ICD-10-CM | POA: Diagnosis not present

## 2018-09-06 DIAGNOSIS — R293 Abnormal posture: Secondary | ICD-10-CM | POA: Diagnosis not present

## 2018-09-06 DIAGNOSIS — R2681 Unsteadiness on feet: Secondary | ICD-10-CM | POA: Diagnosis not present

## 2018-09-06 DIAGNOSIS — R2689 Other abnormalities of gait and mobility: Secondary | ICD-10-CM | POA: Diagnosis not present

## 2018-09-08 DIAGNOSIS — R293 Abnormal posture: Secondary | ICD-10-CM | POA: Diagnosis not present

## 2018-09-08 DIAGNOSIS — M6281 Muscle weakness (generalized): Secondary | ICD-10-CM | POA: Diagnosis not present

## 2018-09-08 DIAGNOSIS — R2689 Other abnormalities of gait and mobility: Secondary | ICD-10-CM | POA: Diagnosis not present

## 2018-09-08 DIAGNOSIS — Z9181 History of falling: Secondary | ICD-10-CM | POA: Diagnosis not present

## 2018-09-08 DIAGNOSIS — R2681 Unsteadiness on feet: Secondary | ICD-10-CM | POA: Diagnosis not present

## 2018-09-09 DIAGNOSIS — R2681 Unsteadiness on feet: Secondary | ICD-10-CM | POA: Diagnosis not present

## 2018-09-09 DIAGNOSIS — R293 Abnormal posture: Secondary | ICD-10-CM | POA: Diagnosis not present

## 2018-09-09 DIAGNOSIS — R2689 Other abnormalities of gait and mobility: Secondary | ICD-10-CM | POA: Diagnosis not present

## 2018-09-09 DIAGNOSIS — M6281 Muscle weakness (generalized): Secondary | ICD-10-CM | POA: Diagnosis not present

## 2018-09-09 DIAGNOSIS — Z9181 History of falling: Secondary | ICD-10-CM | POA: Diagnosis not present

## 2018-09-10 DIAGNOSIS — M6281 Muscle weakness (generalized): Secondary | ICD-10-CM | POA: Diagnosis not present

## 2018-09-10 DIAGNOSIS — R2689 Other abnormalities of gait and mobility: Secondary | ICD-10-CM | POA: Diagnosis not present

## 2018-09-10 DIAGNOSIS — Z9181 History of falling: Secondary | ICD-10-CM | POA: Diagnosis not present

## 2018-09-10 DIAGNOSIS — R293 Abnormal posture: Secondary | ICD-10-CM | POA: Diagnosis not present

## 2018-09-10 DIAGNOSIS — R2681 Unsteadiness on feet: Secondary | ICD-10-CM | POA: Diagnosis not present

## 2018-09-11 DIAGNOSIS — R2689 Other abnormalities of gait and mobility: Secondary | ICD-10-CM | POA: Diagnosis not present

## 2018-09-11 DIAGNOSIS — R293 Abnormal posture: Secondary | ICD-10-CM | POA: Diagnosis not present

## 2018-09-11 DIAGNOSIS — R2681 Unsteadiness on feet: Secondary | ICD-10-CM | POA: Diagnosis not present

## 2018-09-11 DIAGNOSIS — M6281 Muscle weakness (generalized): Secondary | ICD-10-CM | POA: Diagnosis not present

## 2018-09-11 DIAGNOSIS — Z9181 History of falling: Secondary | ICD-10-CM | POA: Diagnosis not present

## 2018-09-12 DIAGNOSIS — R2681 Unsteadiness on feet: Secondary | ICD-10-CM | POA: Diagnosis not present

## 2018-09-12 DIAGNOSIS — R2689 Other abnormalities of gait and mobility: Secondary | ICD-10-CM | POA: Diagnosis not present

## 2018-09-12 DIAGNOSIS — R293 Abnormal posture: Secondary | ICD-10-CM | POA: Diagnosis not present

## 2018-09-12 DIAGNOSIS — M6281 Muscle weakness (generalized): Secondary | ICD-10-CM | POA: Diagnosis not present

## 2018-09-12 DIAGNOSIS — Z9181 History of falling: Secondary | ICD-10-CM | POA: Diagnosis not present

## 2018-09-15 DIAGNOSIS — R2689 Other abnormalities of gait and mobility: Secondary | ICD-10-CM | POA: Diagnosis not present

## 2018-09-15 DIAGNOSIS — D509 Iron deficiency anemia, unspecified: Secondary | ICD-10-CM | POA: Diagnosis not present

## 2018-09-15 DIAGNOSIS — H8110 Benign paroxysmal vertigo, unspecified ear: Secondary | ICD-10-CM | POA: Diagnosis not present

## 2018-09-15 DIAGNOSIS — R2681 Unsteadiness on feet: Secondary | ICD-10-CM | POA: Diagnosis not present

## 2018-09-15 DIAGNOSIS — S72402D Unspecified fracture of lower end of left femur, subsequent encounter for closed fracture with routine healing: Secondary | ICD-10-CM | POA: Diagnosis not present

## 2018-09-15 DIAGNOSIS — E785 Hyperlipidemia, unspecified: Secondary | ICD-10-CM | POA: Diagnosis not present

## 2018-09-15 DIAGNOSIS — M6281 Muscle weakness (generalized): Secondary | ICD-10-CM | POA: Diagnosis not present

## 2018-09-15 DIAGNOSIS — R293 Abnormal posture: Secondary | ICD-10-CM | POA: Diagnosis not present

## 2018-09-15 DIAGNOSIS — Z9181 History of falling: Secondary | ICD-10-CM | POA: Diagnosis not present

## 2018-09-15 DIAGNOSIS — I10 Essential (primary) hypertension: Secondary | ICD-10-CM | POA: Diagnosis not present

## 2018-09-16 DIAGNOSIS — I10 Essential (primary) hypertension: Secondary | ICD-10-CM | POA: Diagnosis not present

## 2018-09-16 DIAGNOSIS — M6281 Muscle weakness (generalized): Secondary | ICD-10-CM | POA: Diagnosis not present

## 2018-09-16 DIAGNOSIS — R2689 Other abnormalities of gait and mobility: Secondary | ICD-10-CM | POA: Diagnosis not present

## 2018-09-16 DIAGNOSIS — D649 Anemia, unspecified: Secondary | ICD-10-CM | POA: Diagnosis not present

## 2018-09-16 DIAGNOSIS — R293 Abnormal posture: Secondary | ICD-10-CM | POA: Diagnosis not present

## 2018-09-16 DIAGNOSIS — Z9181 History of falling: Secondary | ICD-10-CM | POA: Diagnosis not present

## 2018-09-16 DIAGNOSIS — R2681 Unsteadiness on feet: Secondary | ICD-10-CM | POA: Diagnosis not present

## 2018-09-17 DIAGNOSIS — Z9181 History of falling: Secondary | ICD-10-CM | POA: Diagnosis not present

## 2018-09-17 DIAGNOSIS — M6281 Muscle weakness (generalized): Secondary | ICD-10-CM | POA: Diagnosis not present

## 2018-09-17 DIAGNOSIS — R293 Abnormal posture: Secondary | ICD-10-CM | POA: Diagnosis not present

## 2018-09-17 DIAGNOSIS — R2681 Unsteadiness on feet: Secondary | ICD-10-CM | POA: Diagnosis not present

## 2018-09-17 DIAGNOSIS — R2689 Other abnormalities of gait and mobility: Secondary | ICD-10-CM | POA: Diagnosis not present

## 2018-09-18 DIAGNOSIS — R2681 Unsteadiness on feet: Secondary | ICD-10-CM | POA: Diagnosis not present

## 2018-09-18 DIAGNOSIS — R293 Abnormal posture: Secondary | ICD-10-CM | POA: Diagnosis not present

## 2018-09-18 DIAGNOSIS — M6281 Muscle weakness (generalized): Secondary | ICD-10-CM | POA: Diagnosis not present

## 2018-09-18 DIAGNOSIS — R2689 Other abnormalities of gait and mobility: Secondary | ICD-10-CM | POA: Diagnosis not present

## 2018-09-18 DIAGNOSIS — Z9181 History of falling: Secondary | ICD-10-CM | POA: Diagnosis not present

## 2018-09-19 DIAGNOSIS — H353121 Nonexudative age-related macular degeneration, left eye, early dry stage: Secondary | ICD-10-CM | POA: Diagnosis not present

## 2018-09-19 DIAGNOSIS — R2681 Unsteadiness on feet: Secondary | ICD-10-CM | POA: Diagnosis not present

## 2018-09-19 DIAGNOSIS — H34831 Tributary (branch) retinal vein occlusion, right eye, with macular edema: Secondary | ICD-10-CM | POA: Diagnosis not present

## 2018-09-19 DIAGNOSIS — R293 Abnormal posture: Secondary | ICD-10-CM | POA: Diagnosis not present

## 2018-09-19 DIAGNOSIS — H35371 Puckering of macula, right eye: Secondary | ICD-10-CM | POA: Diagnosis not present

## 2018-09-19 DIAGNOSIS — M6281 Muscle weakness (generalized): Secondary | ICD-10-CM | POA: Diagnosis not present

## 2018-09-19 DIAGNOSIS — H35041 Retinal micro-aneurysms, unspecified, right eye: Secondary | ICD-10-CM | POA: Diagnosis not present

## 2018-09-19 DIAGNOSIS — R2689 Other abnormalities of gait and mobility: Secondary | ICD-10-CM | POA: Diagnosis not present

## 2018-09-22 DIAGNOSIS — R2689 Other abnormalities of gait and mobility: Secondary | ICD-10-CM | POA: Diagnosis not present

## 2018-09-22 DIAGNOSIS — R293 Abnormal posture: Secondary | ICD-10-CM | POA: Diagnosis not present

## 2018-09-22 DIAGNOSIS — M6281 Muscle weakness (generalized): Secondary | ICD-10-CM | POA: Diagnosis not present

## 2018-09-22 DIAGNOSIS — R2681 Unsteadiness on feet: Secondary | ICD-10-CM | POA: Diagnosis not present

## 2018-09-23 DIAGNOSIS — R2689 Other abnormalities of gait and mobility: Secondary | ICD-10-CM | POA: Diagnosis not present

## 2018-09-23 DIAGNOSIS — R2681 Unsteadiness on feet: Secondary | ICD-10-CM | POA: Diagnosis not present

## 2018-09-23 DIAGNOSIS — M6281 Muscle weakness (generalized): Secondary | ICD-10-CM | POA: Diagnosis not present

## 2018-09-23 DIAGNOSIS — R293 Abnormal posture: Secondary | ICD-10-CM | POA: Diagnosis not present

## 2018-09-24 DIAGNOSIS — R2689 Other abnormalities of gait and mobility: Secondary | ICD-10-CM | POA: Diagnosis not present

## 2018-09-24 DIAGNOSIS — M6281 Muscle weakness (generalized): Secondary | ICD-10-CM | POA: Diagnosis not present

## 2018-09-24 DIAGNOSIS — R293 Abnormal posture: Secondary | ICD-10-CM | POA: Diagnosis not present

## 2018-09-24 DIAGNOSIS — R2681 Unsteadiness on feet: Secondary | ICD-10-CM | POA: Diagnosis not present

## 2018-09-25 DIAGNOSIS — R293 Abnormal posture: Secondary | ICD-10-CM | POA: Diagnosis not present

## 2018-09-25 DIAGNOSIS — R2689 Other abnormalities of gait and mobility: Secondary | ICD-10-CM | POA: Diagnosis not present

## 2018-09-25 DIAGNOSIS — R2681 Unsteadiness on feet: Secondary | ICD-10-CM | POA: Diagnosis not present

## 2018-09-25 DIAGNOSIS — M6281 Muscle weakness (generalized): Secondary | ICD-10-CM | POA: Diagnosis not present

## 2018-09-26 DIAGNOSIS — M6281 Muscle weakness (generalized): Secondary | ICD-10-CM | POA: Diagnosis not present

## 2018-09-26 DIAGNOSIS — R293 Abnormal posture: Secondary | ICD-10-CM | POA: Diagnosis not present

## 2018-09-26 DIAGNOSIS — R2681 Unsteadiness on feet: Secondary | ICD-10-CM | POA: Diagnosis not present

## 2018-09-26 DIAGNOSIS — R2689 Other abnormalities of gait and mobility: Secondary | ICD-10-CM | POA: Diagnosis not present

## 2018-09-27 DIAGNOSIS — R2681 Unsteadiness on feet: Secondary | ICD-10-CM | POA: Diagnosis not present

## 2018-09-27 DIAGNOSIS — R2689 Other abnormalities of gait and mobility: Secondary | ICD-10-CM | POA: Diagnosis not present

## 2018-09-27 DIAGNOSIS — R293 Abnormal posture: Secondary | ICD-10-CM | POA: Diagnosis not present

## 2018-09-27 DIAGNOSIS — M6281 Muscle weakness (generalized): Secondary | ICD-10-CM | POA: Diagnosis not present

## 2018-09-29 DIAGNOSIS — R2681 Unsteadiness on feet: Secondary | ICD-10-CM | POA: Diagnosis not present

## 2018-09-29 DIAGNOSIS — M6281 Muscle weakness (generalized): Secondary | ICD-10-CM | POA: Diagnosis not present

## 2018-09-29 DIAGNOSIS — R2689 Other abnormalities of gait and mobility: Secondary | ICD-10-CM | POA: Diagnosis not present

## 2018-09-29 DIAGNOSIS — R293 Abnormal posture: Secondary | ICD-10-CM | POA: Diagnosis not present

## 2018-09-30 DIAGNOSIS — R2681 Unsteadiness on feet: Secondary | ICD-10-CM | POA: Diagnosis not present

## 2018-09-30 DIAGNOSIS — R2689 Other abnormalities of gait and mobility: Secondary | ICD-10-CM | POA: Diagnosis not present

## 2018-09-30 DIAGNOSIS — R293 Abnormal posture: Secondary | ICD-10-CM | POA: Diagnosis not present

## 2018-09-30 DIAGNOSIS — M6281 Muscle weakness (generalized): Secondary | ICD-10-CM | POA: Diagnosis not present

## 2018-10-01 DIAGNOSIS — R2681 Unsteadiness on feet: Secondary | ICD-10-CM | POA: Diagnosis not present

## 2018-10-01 DIAGNOSIS — M6281 Muscle weakness (generalized): Secondary | ICD-10-CM | POA: Diagnosis not present

## 2018-10-01 DIAGNOSIS — R2689 Other abnormalities of gait and mobility: Secondary | ICD-10-CM | POA: Diagnosis not present

## 2018-10-01 DIAGNOSIS — R293 Abnormal posture: Secondary | ICD-10-CM | POA: Diagnosis not present

## 2018-10-02 DIAGNOSIS — M6281 Muscle weakness (generalized): Secondary | ICD-10-CM | POA: Diagnosis not present

## 2018-10-02 DIAGNOSIS — R2689 Other abnormalities of gait and mobility: Secondary | ICD-10-CM | POA: Diagnosis not present

## 2018-10-02 DIAGNOSIS — R293 Abnormal posture: Secondary | ICD-10-CM | POA: Diagnosis not present

## 2018-10-02 DIAGNOSIS — R2681 Unsteadiness on feet: Secondary | ICD-10-CM | POA: Diagnosis not present

## 2018-10-03 DIAGNOSIS — R2681 Unsteadiness on feet: Secondary | ICD-10-CM | POA: Diagnosis not present

## 2018-10-03 DIAGNOSIS — R2689 Other abnormalities of gait and mobility: Secondary | ICD-10-CM | POA: Diagnosis not present

## 2018-10-03 DIAGNOSIS — R293 Abnormal posture: Secondary | ICD-10-CM | POA: Diagnosis not present

## 2018-10-03 DIAGNOSIS — M6281 Muscle weakness (generalized): Secondary | ICD-10-CM | POA: Diagnosis not present

## 2018-10-04 DIAGNOSIS — R2689 Other abnormalities of gait and mobility: Secondary | ICD-10-CM | POA: Diagnosis not present

## 2018-10-04 DIAGNOSIS — R2681 Unsteadiness on feet: Secondary | ICD-10-CM | POA: Diagnosis not present

## 2018-10-04 DIAGNOSIS — M6281 Muscle weakness (generalized): Secondary | ICD-10-CM | POA: Diagnosis not present

## 2018-10-04 DIAGNOSIS — R293 Abnormal posture: Secondary | ICD-10-CM | POA: Diagnosis not present

## 2018-10-06 DIAGNOSIS — R293 Abnormal posture: Secondary | ICD-10-CM | POA: Diagnosis not present

## 2018-10-06 DIAGNOSIS — R2681 Unsteadiness on feet: Secondary | ICD-10-CM | POA: Diagnosis not present

## 2018-10-06 DIAGNOSIS — M6281 Muscle weakness (generalized): Secondary | ICD-10-CM | POA: Diagnosis not present

## 2018-10-06 DIAGNOSIS — R2689 Other abnormalities of gait and mobility: Secondary | ICD-10-CM | POA: Diagnosis not present

## 2018-10-07 DIAGNOSIS — R293 Abnormal posture: Secondary | ICD-10-CM | POA: Diagnosis not present

## 2018-10-07 DIAGNOSIS — M6281 Muscle weakness (generalized): Secondary | ICD-10-CM | POA: Diagnosis not present

## 2018-10-07 DIAGNOSIS — R2689 Other abnormalities of gait and mobility: Secondary | ICD-10-CM | POA: Diagnosis not present

## 2018-10-07 DIAGNOSIS — R2681 Unsteadiness on feet: Secondary | ICD-10-CM | POA: Diagnosis not present

## 2018-10-08 DIAGNOSIS — M6281 Muscle weakness (generalized): Secondary | ICD-10-CM | POA: Diagnosis not present

## 2018-10-08 DIAGNOSIS — R2681 Unsteadiness on feet: Secondary | ICD-10-CM | POA: Diagnosis not present

## 2018-10-08 DIAGNOSIS — R293 Abnormal posture: Secondary | ICD-10-CM | POA: Diagnosis not present

## 2018-10-08 DIAGNOSIS — R2689 Other abnormalities of gait and mobility: Secondary | ICD-10-CM | POA: Diagnosis not present

## 2018-10-09 DIAGNOSIS — R2681 Unsteadiness on feet: Secondary | ICD-10-CM | POA: Diagnosis not present

## 2018-10-09 DIAGNOSIS — R2689 Other abnormalities of gait and mobility: Secondary | ICD-10-CM | POA: Diagnosis not present

## 2018-10-09 DIAGNOSIS — R293 Abnormal posture: Secondary | ICD-10-CM | POA: Diagnosis not present

## 2018-10-09 DIAGNOSIS — M6281 Muscle weakness (generalized): Secondary | ICD-10-CM | POA: Diagnosis not present

## 2018-10-10 DIAGNOSIS — R2681 Unsteadiness on feet: Secondary | ICD-10-CM | POA: Diagnosis not present

## 2018-10-10 DIAGNOSIS — M6281 Muscle weakness (generalized): Secondary | ICD-10-CM | POA: Diagnosis not present

## 2018-10-10 DIAGNOSIS — R293 Abnormal posture: Secondary | ICD-10-CM | POA: Diagnosis not present

## 2018-10-10 DIAGNOSIS — R2689 Other abnormalities of gait and mobility: Secondary | ICD-10-CM | POA: Diagnosis not present

## 2018-10-13 DIAGNOSIS — R2689 Other abnormalities of gait and mobility: Secondary | ICD-10-CM | POA: Diagnosis not present

## 2018-10-13 DIAGNOSIS — R293 Abnormal posture: Secondary | ICD-10-CM | POA: Diagnosis not present

## 2018-10-13 DIAGNOSIS — R2681 Unsteadiness on feet: Secondary | ICD-10-CM | POA: Diagnosis not present

## 2018-10-13 DIAGNOSIS — M6281 Muscle weakness (generalized): Secondary | ICD-10-CM | POA: Diagnosis not present

## 2018-10-14 DIAGNOSIS — M6281 Muscle weakness (generalized): Secondary | ICD-10-CM | POA: Diagnosis not present

## 2018-10-14 DIAGNOSIS — R2681 Unsteadiness on feet: Secondary | ICD-10-CM | POA: Diagnosis not present

## 2018-10-14 DIAGNOSIS — R2689 Other abnormalities of gait and mobility: Secondary | ICD-10-CM | POA: Diagnosis not present

## 2018-10-14 DIAGNOSIS — R293 Abnormal posture: Secondary | ICD-10-CM | POA: Diagnosis not present

## 2018-10-15 DIAGNOSIS — R293 Abnormal posture: Secondary | ICD-10-CM | POA: Diagnosis not present

## 2018-10-15 DIAGNOSIS — R2681 Unsteadiness on feet: Secondary | ICD-10-CM | POA: Diagnosis not present

## 2018-10-15 DIAGNOSIS — R2689 Other abnormalities of gait and mobility: Secondary | ICD-10-CM | POA: Diagnosis not present

## 2018-10-15 DIAGNOSIS — M6281 Muscle weakness (generalized): Secondary | ICD-10-CM | POA: Diagnosis not present

## 2018-10-17 DIAGNOSIS — R293 Abnormal posture: Secondary | ICD-10-CM | POA: Diagnosis not present

## 2018-10-17 DIAGNOSIS — M6281 Muscle weakness (generalized): Secondary | ICD-10-CM | POA: Diagnosis not present

## 2018-10-17 DIAGNOSIS — R2689 Other abnormalities of gait and mobility: Secondary | ICD-10-CM | POA: Diagnosis not present

## 2018-10-17 DIAGNOSIS — R2681 Unsteadiness on feet: Secondary | ICD-10-CM | POA: Diagnosis not present

## 2018-10-20 DIAGNOSIS — M6281 Muscle weakness (generalized): Secondary | ICD-10-CM | POA: Diagnosis not present

## 2018-10-20 DIAGNOSIS — R2689 Other abnormalities of gait and mobility: Secondary | ICD-10-CM | POA: Diagnosis not present

## 2018-10-20 DIAGNOSIS — Z9181 History of falling: Secondary | ICD-10-CM | POA: Diagnosis not present

## 2018-10-20 DIAGNOSIS — Z4789 Encounter for other orthopedic aftercare: Secondary | ICD-10-CM | POA: Diagnosis not present

## 2018-10-20 DIAGNOSIS — S72452A Displaced supracondylar fracture without intracondylar extension of lower end of left femur, initial encounter for closed fracture: Secondary | ICD-10-CM | POA: Diagnosis not present

## 2018-10-21 DIAGNOSIS — Z4789 Encounter for other orthopedic aftercare: Secondary | ICD-10-CM | POA: Diagnosis not present

## 2018-10-21 DIAGNOSIS — R2689 Other abnormalities of gait and mobility: Secondary | ICD-10-CM | POA: Diagnosis not present

## 2018-10-21 DIAGNOSIS — S72452A Displaced supracondylar fracture without intracondylar extension of lower end of left femur, initial encounter for closed fracture: Secondary | ICD-10-CM | POA: Diagnosis not present

## 2018-10-21 DIAGNOSIS — M6281 Muscle weakness (generalized): Secondary | ICD-10-CM | POA: Diagnosis not present

## 2018-10-21 DIAGNOSIS — Z9181 History of falling: Secondary | ICD-10-CM | POA: Diagnosis not present

## 2018-10-22 DIAGNOSIS — M6281 Muscle weakness (generalized): Secondary | ICD-10-CM | POA: Diagnosis not present

## 2018-10-22 DIAGNOSIS — S72452A Displaced supracondylar fracture without intracondylar extension of lower end of left femur, initial encounter for closed fracture: Secondary | ICD-10-CM | POA: Diagnosis not present

## 2018-10-22 DIAGNOSIS — Z4789 Encounter for other orthopedic aftercare: Secondary | ICD-10-CM | POA: Diagnosis not present

## 2018-10-22 DIAGNOSIS — Z9181 History of falling: Secondary | ICD-10-CM | POA: Diagnosis not present

## 2018-10-22 DIAGNOSIS — R2689 Other abnormalities of gait and mobility: Secondary | ICD-10-CM | POA: Diagnosis not present

## 2018-10-23 DIAGNOSIS — S72452A Displaced supracondylar fracture without intracondylar extension of lower end of left femur, initial encounter for closed fracture: Secondary | ICD-10-CM | POA: Diagnosis not present

## 2018-10-23 DIAGNOSIS — R2689 Other abnormalities of gait and mobility: Secondary | ICD-10-CM | POA: Diagnosis not present

## 2018-10-23 DIAGNOSIS — Z4789 Encounter for other orthopedic aftercare: Secondary | ICD-10-CM | POA: Diagnosis not present

## 2018-10-23 DIAGNOSIS — Z9181 History of falling: Secondary | ICD-10-CM | POA: Diagnosis not present

## 2018-10-23 DIAGNOSIS — M6281 Muscle weakness (generalized): Secondary | ICD-10-CM | POA: Diagnosis not present

## 2018-10-24 DIAGNOSIS — I10 Essential (primary) hypertension: Secondary | ICD-10-CM | POA: Diagnosis not present

## 2018-10-24 DIAGNOSIS — E559 Vitamin D deficiency, unspecified: Secondary | ICD-10-CM | POA: Diagnosis not present

## 2018-10-24 DIAGNOSIS — Z9181 History of falling: Secondary | ICD-10-CM | POA: Diagnosis not present

## 2018-10-24 DIAGNOSIS — C641 Malignant neoplasm of right kidney, except renal pelvis: Secondary | ICD-10-CM | POA: Diagnosis not present

## 2018-10-24 DIAGNOSIS — L719 Rosacea, unspecified: Secondary | ICD-10-CM | POA: Diagnosis not present

## 2018-10-24 DIAGNOSIS — E785 Hyperlipidemia, unspecified: Secondary | ICD-10-CM | POA: Diagnosis not present

## 2018-10-25 DIAGNOSIS — Z4789 Encounter for other orthopedic aftercare: Secondary | ICD-10-CM | POA: Diagnosis not present

## 2018-10-25 DIAGNOSIS — Z9181 History of falling: Secondary | ICD-10-CM | POA: Diagnosis not present

## 2018-10-25 DIAGNOSIS — M6281 Muscle weakness (generalized): Secondary | ICD-10-CM | POA: Diagnosis not present

## 2018-10-25 DIAGNOSIS — R2689 Other abnormalities of gait and mobility: Secondary | ICD-10-CM | POA: Diagnosis not present

## 2018-10-25 DIAGNOSIS — S72452A Displaced supracondylar fracture without intracondylar extension of lower end of left femur, initial encounter for closed fracture: Secondary | ICD-10-CM | POA: Diagnosis not present

## 2018-10-27 DIAGNOSIS — M6281 Muscle weakness (generalized): Secondary | ICD-10-CM | POA: Diagnosis not present

## 2018-10-27 DIAGNOSIS — S72452A Displaced supracondylar fracture without intracondylar extension of lower end of left femur, initial encounter for closed fracture: Secondary | ICD-10-CM | POA: Diagnosis not present

## 2018-10-27 DIAGNOSIS — Z9181 History of falling: Secondary | ICD-10-CM | POA: Diagnosis not present

## 2018-10-27 DIAGNOSIS — Z4789 Encounter for other orthopedic aftercare: Secondary | ICD-10-CM | POA: Diagnosis not present

## 2018-10-27 DIAGNOSIS — R2689 Other abnormalities of gait and mobility: Secondary | ICD-10-CM | POA: Diagnosis not present

## 2018-10-28 DIAGNOSIS — Z4789 Encounter for other orthopedic aftercare: Secondary | ICD-10-CM | POA: Diagnosis not present

## 2018-10-28 DIAGNOSIS — Z9181 History of falling: Secondary | ICD-10-CM | POA: Diagnosis not present

## 2018-10-28 DIAGNOSIS — S72452A Displaced supracondylar fracture without intracondylar extension of lower end of left femur, initial encounter for closed fracture: Secondary | ICD-10-CM | POA: Diagnosis not present

## 2018-10-28 DIAGNOSIS — R2689 Other abnormalities of gait and mobility: Secondary | ICD-10-CM | POA: Diagnosis not present

## 2018-10-28 DIAGNOSIS — M6281 Muscle weakness (generalized): Secondary | ICD-10-CM | POA: Diagnosis not present

## 2018-10-29 DIAGNOSIS — M6281 Muscle weakness (generalized): Secondary | ICD-10-CM | POA: Diagnosis not present

## 2018-10-29 DIAGNOSIS — R2689 Other abnormalities of gait and mobility: Secondary | ICD-10-CM | POA: Diagnosis not present

## 2018-10-29 DIAGNOSIS — Z4789 Encounter for other orthopedic aftercare: Secondary | ICD-10-CM | POA: Diagnosis not present

## 2018-10-29 DIAGNOSIS — S72452A Displaced supracondylar fracture without intracondylar extension of lower end of left femur, initial encounter for closed fracture: Secondary | ICD-10-CM | POA: Diagnosis not present

## 2018-10-29 DIAGNOSIS — Z9181 History of falling: Secondary | ICD-10-CM | POA: Diagnosis not present

## 2018-10-31 DIAGNOSIS — H35371 Puckering of macula, right eye: Secondary | ICD-10-CM | POA: Diagnosis not present

## 2018-10-31 DIAGNOSIS — H353121 Nonexudative age-related macular degeneration, left eye, early dry stage: Secondary | ICD-10-CM | POA: Diagnosis not present

## 2018-10-31 DIAGNOSIS — Z9181 History of falling: Secondary | ICD-10-CM | POA: Diagnosis not present

## 2018-10-31 DIAGNOSIS — H34831 Tributary (branch) retinal vein occlusion, right eye, with macular edema: Secondary | ICD-10-CM | POA: Diagnosis not present

## 2018-10-31 DIAGNOSIS — S72452A Displaced supracondylar fracture without intracondylar extension of lower end of left femur, initial encounter for closed fracture: Secondary | ICD-10-CM | POA: Diagnosis not present

## 2018-10-31 DIAGNOSIS — Z4789 Encounter for other orthopedic aftercare: Secondary | ICD-10-CM | POA: Diagnosis not present

## 2018-10-31 DIAGNOSIS — R2689 Other abnormalities of gait and mobility: Secondary | ICD-10-CM | POA: Diagnosis not present

## 2018-10-31 DIAGNOSIS — M6281 Muscle weakness (generalized): Secondary | ICD-10-CM | POA: Diagnosis not present

## 2018-11-04 DIAGNOSIS — H8110 Benign paroxysmal vertigo, unspecified ear: Secondary | ICD-10-CM | POA: Diagnosis not present

## 2018-11-04 DIAGNOSIS — R110 Nausea: Secondary | ICD-10-CM | POA: Diagnosis not present

## 2018-11-04 DIAGNOSIS — M6281 Muscle weakness (generalized): Secondary | ICD-10-CM | POA: Diagnosis not present

## 2018-11-05 DIAGNOSIS — Z9181 History of falling: Secondary | ICD-10-CM | POA: Diagnosis not present

## 2018-11-05 DIAGNOSIS — M6281 Muscle weakness (generalized): Secondary | ICD-10-CM | POA: Diagnosis not present

## 2018-11-05 DIAGNOSIS — Z4789 Encounter for other orthopedic aftercare: Secondary | ICD-10-CM | POA: Diagnosis not present

## 2018-11-05 DIAGNOSIS — S72452A Displaced supracondylar fracture without intracondylar extension of lower end of left femur, initial encounter for closed fracture: Secondary | ICD-10-CM | POA: Diagnosis not present

## 2018-11-05 DIAGNOSIS — R2689 Other abnormalities of gait and mobility: Secondary | ICD-10-CM | POA: Diagnosis not present

## 2018-11-07 DIAGNOSIS — S72452A Displaced supracondylar fracture without intracondylar extension of lower end of left femur, initial encounter for closed fracture: Secondary | ICD-10-CM | POA: Diagnosis not present

## 2018-11-07 DIAGNOSIS — R2689 Other abnormalities of gait and mobility: Secondary | ICD-10-CM | POA: Diagnosis not present

## 2018-11-07 DIAGNOSIS — M6281 Muscle weakness (generalized): Secondary | ICD-10-CM | POA: Diagnosis not present

## 2018-11-07 DIAGNOSIS — Z4789 Encounter for other orthopedic aftercare: Secondary | ICD-10-CM | POA: Diagnosis not present

## 2018-11-07 DIAGNOSIS — Z9181 History of falling: Secondary | ICD-10-CM | POA: Diagnosis not present

## 2018-11-08 DIAGNOSIS — D649 Anemia, unspecified: Secondary | ICD-10-CM | POA: Diagnosis not present

## 2018-11-08 DIAGNOSIS — I10 Essential (primary) hypertension: Secondary | ICD-10-CM | POA: Diagnosis not present

## 2018-11-23 DIAGNOSIS — D649 Anemia, unspecified: Secondary | ICD-10-CM | POA: Diagnosis not present

## 2018-12-23 DIAGNOSIS — H43813 Vitreous degeneration, bilateral: Secondary | ICD-10-CM | POA: Diagnosis not present

## 2018-12-23 DIAGNOSIS — H35371 Puckering of macula, right eye: Secondary | ICD-10-CM | POA: Diagnosis not present

## 2018-12-23 DIAGNOSIS — H353121 Nonexudative age-related macular degeneration, left eye, early dry stage: Secondary | ICD-10-CM | POA: Diagnosis not present

## 2018-12-23 DIAGNOSIS — H348312 Tributary (branch) retinal vein occlusion, right eye, stable: Secondary | ICD-10-CM | POA: Diagnosis not present

## 2018-12-24 ENCOUNTER — Other Ambulatory Visit: Payer: Self-pay | Admitting: *Deleted

## 2018-12-24 DIAGNOSIS — M6281 Muscle weakness (generalized): Secondary | ICD-10-CM | POA: Diagnosis not present

## 2018-12-24 DIAGNOSIS — R2689 Other abnormalities of gait and mobility: Secondary | ICD-10-CM | POA: Diagnosis not present

## 2018-12-24 DIAGNOSIS — S72452A Displaced supracondylar fracture without intracondylar extension of lower end of left femur, initial encounter for closed fracture: Secondary | ICD-10-CM | POA: Diagnosis not present

## 2018-12-24 DIAGNOSIS — Z4789 Encounter for other orthopedic aftercare: Secondary | ICD-10-CM | POA: Diagnosis not present

## 2018-12-24 DIAGNOSIS — Z9181 History of falling: Secondary | ICD-10-CM | POA: Diagnosis not present

## 2018-12-24 NOTE — Patient Outreach (Signed)
[facility]) Care Management  12/24/2018  Christy Houston Mar 11, 1926 [id number]   Subjective: Telephone call to patient's home number, spoke with patient, stated her name, declined to verify date of birth, and address.   Christy Houston states she has already given address and date of birth to [place], does not want to answer any additional questions, or need any services.  Patient states she appreciates call and disconnected call.    Objective: Per KPN (Knowledge Performance Now, point of care tool) and chart review, patient discharged from skilled nursing facility [facility], discharge date unknown.  Patient also has a history of skin cancer, hypertension, chronic kidney disease, and osteoarthritis.       Assessment: Received Medicare EMMI General Discharge Red Alert follow up referral on 12/24/2018.   Red alert flag, Day #1, patient answered no to the following question: Wounds healing well?  EMMI  follow up not completed due to patient refusing services and will proceed with case closure.      Plan: RNCM will close case due to patient's refusal of services. RNCM will send MD case closure letter.       Christy Houston. Christy Houston, BSN, [place] [facility] Telephonic CM Phone: [phone] Fax: [phone]

## 2018-12-25 DIAGNOSIS — Z4789 Encounter for other orthopedic aftercare: Secondary | ICD-10-CM | POA: Diagnosis not present

## 2018-12-25 DIAGNOSIS — R2689 Other abnormalities of gait and mobility: Secondary | ICD-10-CM | POA: Diagnosis not present

## 2018-12-25 DIAGNOSIS — M6281 Muscle weakness (generalized): Secondary | ICD-10-CM | POA: Diagnosis not present

## 2018-12-25 DIAGNOSIS — S72452A Displaced supracondylar fracture without intracondylar extension of lower end of left femur, initial encounter for closed fracture: Secondary | ICD-10-CM | POA: Diagnosis not present

## 2018-12-25 DIAGNOSIS — Z9181 History of falling: Secondary | ICD-10-CM | POA: Diagnosis not present

## 2018-12-29 DIAGNOSIS — Z4789 Encounter for other orthopedic aftercare: Secondary | ICD-10-CM | POA: Diagnosis not present

## 2018-12-29 DIAGNOSIS — Z9181 History of falling: Secondary | ICD-10-CM | POA: Diagnosis not present

## 2018-12-29 DIAGNOSIS — M6281 Muscle weakness (generalized): Secondary | ICD-10-CM | POA: Diagnosis not present

## 2018-12-29 DIAGNOSIS — S72452A Displaced supracondylar fracture without intracondylar extension of lower end of left femur, initial encounter for closed fracture: Secondary | ICD-10-CM | POA: Diagnosis not present

## 2018-12-29 DIAGNOSIS — R2689 Other abnormalities of gait and mobility: Secondary | ICD-10-CM | POA: Diagnosis not present

## 2018-12-30 DIAGNOSIS — H8113 Benign paroxysmal vertigo, bilateral: Secondary | ICD-10-CM | POA: Diagnosis not present

## 2018-12-30 DIAGNOSIS — R197 Diarrhea, unspecified: Secondary | ICD-10-CM | POA: Diagnosis not present

## 2018-12-30 DIAGNOSIS — S72452A Displaced supracondylar fracture without intracondylar extension of lower end of left femur, initial encounter for closed fracture: Secondary | ICD-10-CM | POA: Diagnosis not present

## 2018-12-30 DIAGNOSIS — Z4789 Encounter for other orthopedic aftercare: Secondary | ICD-10-CM | POA: Diagnosis not present

## 2018-12-30 DIAGNOSIS — R2689 Other abnormalities of gait and mobility: Secondary | ICD-10-CM | POA: Diagnosis not present

## 2018-12-30 DIAGNOSIS — M6281 Muscle weakness (generalized): Secondary | ICD-10-CM | POA: Diagnosis not present

## 2018-12-30 DIAGNOSIS — Z9181 History of falling: Secondary | ICD-10-CM | POA: Diagnosis not present

## 2018-12-31 DIAGNOSIS — Z9181 History of falling: Secondary | ICD-10-CM | POA: Diagnosis not present

## 2018-12-31 DIAGNOSIS — S72452A Displaced supracondylar fracture without intracondylar extension of lower end of left femur, initial encounter for closed fracture: Secondary | ICD-10-CM | POA: Diagnosis not present

## 2018-12-31 DIAGNOSIS — M6281 Muscle weakness (generalized): Secondary | ICD-10-CM | POA: Diagnosis not present

## 2018-12-31 DIAGNOSIS — R2689 Other abnormalities of gait and mobility: Secondary | ICD-10-CM | POA: Diagnosis not present

## 2018-12-31 DIAGNOSIS — Z4789 Encounter for other orthopedic aftercare: Secondary | ICD-10-CM | POA: Diagnosis not present

## 2019-01-01 DIAGNOSIS — Z9181 History of falling: Secondary | ICD-10-CM | POA: Diagnosis not present

## 2019-01-01 DIAGNOSIS — R2689 Other abnormalities of gait and mobility: Secondary | ICD-10-CM | POA: Diagnosis not present

## 2019-01-01 DIAGNOSIS — S72452A Displaced supracondylar fracture without intracondylar extension of lower end of left femur, initial encounter for closed fracture: Secondary | ICD-10-CM | POA: Diagnosis not present

## 2019-01-01 DIAGNOSIS — Z4789 Encounter for other orthopedic aftercare: Secondary | ICD-10-CM | POA: Diagnosis not present

## 2019-01-01 DIAGNOSIS — M6281 Muscle weakness (generalized): Secondary | ICD-10-CM | POA: Diagnosis not present

## 2019-01-05 DIAGNOSIS — M6281 Muscle weakness (generalized): Secondary | ICD-10-CM | POA: Diagnosis not present

## 2019-01-05 DIAGNOSIS — R2689 Other abnormalities of gait and mobility: Secondary | ICD-10-CM | POA: Diagnosis not present

## 2019-01-05 DIAGNOSIS — Z9181 History of falling: Secondary | ICD-10-CM | POA: Diagnosis not present

## 2019-01-05 DIAGNOSIS — Z4789 Encounter for other orthopedic aftercare: Secondary | ICD-10-CM | POA: Diagnosis not present

## 2019-01-05 DIAGNOSIS — S72452A Displaced supracondylar fracture without intracondylar extension of lower end of left femur, initial encounter for closed fracture: Secondary | ICD-10-CM | POA: Diagnosis not present

## 2019-01-07 DIAGNOSIS — Z9181 History of falling: Secondary | ICD-10-CM | POA: Diagnosis not present

## 2019-01-07 DIAGNOSIS — M6281 Muscle weakness (generalized): Secondary | ICD-10-CM | POA: Diagnosis not present

## 2019-01-07 DIAGNOSIS — R2689 Other abnormalities of gait and mobility: Secondary | ICD-10-CM | POA: Diagnosis not present

## 2019-01-07 DIAGNOSIS — Z4789 Encounter for other orthopedic aftercare: Secondary | ICD-10-CM | POA: Diagnosis not present

## 2019-01-07 DIAGNOSIS — S72452A Displaced supracondylar fracture without intracondylar extension of lower end of left femur, initial encounter for closed fracture: Secondary | ICD-10-CM | POA: Diagnosis not present

## 2019-01-08 DIAGNOSIS — S72452A Displaced supracondylar fracture without intracondylar extension of lower end of left femur, initial encounter for closed fracture: Secondary | ICD-10-CM | POA: Diagnosis not present

## 2019-01-08 DIAGNOSIS — M6281 Muscle weakness (generalized): Secondary | ICD-10-CM | POA: Diagnosis not present

## 2019-01-08 DIAGNOSIS — R2689 Other abnormalities of gait and mobility: Secondary | ICD-10-CM | POA: Diagnosis not present

## 2019-01-08 DIAGNOSIS — Z9181 History of falling: Secondary | ICD-10-CM | POA: Diagnosis not present

## 2019-01-08 DIAGNOSIS — Z4789 Encounter for other orthopedic aftercare: Secondary | ICD-10-CM | POA: Diagnosis not present

## 2019-01-12 DIAGNOSIS — M6281 Muscle weakness (generalized): Secondary | ICD-10-CM | POA: Diagnosis not present

## 2019-01-12 DIAGNOSIS — Z9181 History of falling: Secondary | ICD-10-CM | POA: Diagnosis not present

## 2019-01-12 DIAGNOSIS — Z4789 Encounter for other orthopedic aftercare: Secondary | ICD-10-CM | POA: Diagnosis not present

## 2019-01-12 DIAGNOSIS — R2689 Other abnormalities of gait and mobility: Secondary | ICD-10-CM | POA: Diagnosis not present

## 2019-01-12 DIAGNOSIS — S72452A Displaced supracondylar fracture without intracondylar extension of lower end of left femur, initial encounter for closed fracture: Secondary | ICD-10-CM | POA: Diagnosis not present

## 2019-01-13 DIAGNOSIS — I10 Essential (primary) hypertension: Secondary | ICD-10-CM | POA: Diagnosis not present

## 2019-01-13 DIAGNOSIS — H8113 Benign paroxysmal vertigo, bilateral: Secondary | ICD-10-CM | POA: Diagnosis not present

## 2019-01-13 DIAGNOSIS — R600 Localized edema: Secondary | ICD-10-CM | POA: Diagnosis not present

## 2019-01-13 DIAGNOSIS — S7292XA Unspecified fracture of left femur, initial encounter for closed fracture: Secondary | ICD-10-CM | POA: Diagnosis not present

## 2019-01-14 DIAGNOSIS — A4151 Sepsis due to Escherichia coli [E. coli]: Secondary | ICD-10-CM | POA: Diagnosis not present

## 2019-01-14 DIAGNOSIS — N390 Urinary tract infection, site not specified: Secondary | ICD-10-CM | POA: Diagnosis present

## 2019-01-14 DIAGNOSIS — R2681 Unsteadiness on feet: Secondary | ICD-10-CM | POA: Diagnosis not present

## 2019-01-14 DIAGNOSIS — M6281 Muscle weakness (generalized): Secondary | ICD-10-CM | POA: Diagnosis not present

## 2019-01-14 DIAGNOSIS — D72829 Elevated white blood cell count, unspecified: Secondary | ICD-10-CM | POA: Diagnosis not present

## 2019-01-14 DIAGNOSIS — Z9181 History of falling: Secondary | ICD-10-CM | POA: Diagnosis not present

## 2019-01-14 DIAGNOSIS — I10 Essential (primary) hypertension: Secondary | ICD-10-CM | POA: Diagnosis present

## 2019-01-14 DIAGNOSIS — R112 Nausea with vomiting, unspecified: Secondary | ICD-10-CM | POA: Diagnosis not present

## 2019-01-14 DIAGNOSIS — R509 Fever, unspecified: Secondary | ICD-10-CM | POA: Diagnosis not present

## 2019-01-14 DIAGNOSIS — M2550 Pain in unspecified joint: Secondary | ICD-10-CM | POA: Diagnosis not present

## 2019-01-14 DIAGNOSIS — E785 Hyperlipidemia, unspecified: Secondary | ICD-10-CM | POA: Diagnosis present

## 2019-01-14 DIAGNOSIS — Z4789 Encounter for other orthopedic aftercare: Secondary | ICD-10-CM | POA: Diagnosis not present

## 2019-01-14 DIAGNOSIS — A414 Sepsis due to anaerobes: Secondary | ICD-10-CM | POA: Diagnosis not present

## 2019-01-14 DIAGNOSIS — R42 Dizziness and giddiness: Secondary | ICD-10-CM | POA: Diagnosis present

## 2019-01-14 DIAGNOSIS — B9620 Unspecified Escherichia coli [E. coli] as the cause of diseases classified elsewhere: Secondary | ICD-10-CM | POA: Diagnosis present

## 2019-01-14 DIAGNOSIS — I440 Atrioventricular block, first degree: Secondary | ICD-10-CM | POA: Diagnosis not present

## 2019-01-14 DIAGNOSIS — A0471 Enterocolitis due to Clostridium difficile, recurrent: Secondary | ICD-10-CM | POA: Diagnosis not present

## 2019-01-14 DIAGNOSIS — R6520 Severe sepsis without septic shock: Secondary | ICD-10-CM | POA: Diagnosis not present

## 2019-01-14 DIAGNOSIS — S72452A Displaced supracondylar fracture without intracondylar extension of lower end of left femur, initial encounter for closed fracture: Secondary | ICD-10-CM | POA: Diagnosis not present

## 2019-01-14 DIAGNOSIS — Z7401 Bed confinement status: Secondary | ICD-10-CM | POA: Diagnosis not present

## 2019-01-14 DIAGNOSIS — F329 Major depressive disorder, single episode, unspecified: Secondary | ICD-10-CM | POA: Diagnosis present

## 2019-01-14 DIAGNOSIS — A419 Sepsis, unspecified organism: Secondary | ICD-10-CM | POA: Diagnosis present

## 2019-01-14 DIAGNOSIS — R110 Nausea: Secondary | ICD-10-CM | POA: Diagnosis not present

## 2019-01-14 DIAGNOSIS — R1111 Vomiting without nausea: Secondary | ICD-10-CM | POA: Diagnosis not present

## 2019-01-14 DIAGNOSIS — I959 Hypotension, unspecified: Secondary | ICD-10-CM | POA: Diagnosis not present

## 2019-01-14 DIAGNOSIS — Z87891 Personal history of nicotine dependence: Secondary | ICD-10-CM | POA: Diagnosis not present

## 2019-01-14 DIAGNOSIS — Z7409 Other reduced mobility: Secondary | ICD-10-CM | POA: Diagnosis not present

## 2019-01-14 DIAGNOSIS — R2689 Other abnormalities of gait and mobility: Secondary | ICD-10-CM | POA: Diagnosis not present

## 2019-01-14 DIAGNOSIS — B9689 Other specified bacterial agents as the cause of diseases classified elsewhere: Secondary | ICD-10-CM | POA: Diagnosis not present

## 2019-01-14 DIAGNOSIS — Z7982 Long term (current) use of aspirin: Secondary | ICD-10-CM | POA: Diagnosis not present

## 2019-01-14 DIAGNOSIS — H353211 Exudative age-related macular degeneration, right eye, with active choroidal neovascularization: Secondary | ICD-10-CM | POA: Diagnosis not present

## 2019-01-14 DIAGNOSIS — N3090 Cystitis, unspecified without hematuria: Secondary | ICD-10-CM | POA: Diagnosis not present

## 2019-01-14 DIAGNOSIS — H409 Unspecified glaucoma: Secondary | ICD-10-CM | POA: Diagnosis not present

## 2019-01-14 DIAGNOSIS — H353121 Nonexudative age-related macular degeneration, left eye, early dry stage: Secondary | ICD-10-CM | POA: Diagnosis not present

## 2019-01-21 DIAGNOSIS — A0471 Enterocolitis due to Clostridium difficile, recurrent: Secondary | ICD-10-CM | POA: Diagnosis not present

## 2019-01-21 DIAGNOSIS — E785 Hyperlipidemia, unspecified: Secondary | ICD-10-CM | POA: Diagnosis not present

## 2019-01-21 DIAGNOSIS — I10 Essential (primary) hypertension: Secondary | ICD-10-CM | POA: Diagnosis not present

## 2019-01-21 DIAGNOSIS — N3090 Cystitis, unspecified without hematuria: Secondary | ICD-10-CM | POA: Diagnosis not present

## 2019-01-21 DIAGNOSIS — R2689 Other abnormalities of gait and mobility: Secondary | ICD-10-CM | POA: Diagnosis not present

## 2019-01-21 DIAGNOSIS — H409 Unspecified glaucoma: Secondary | ICD-10-CM | POA: Diagnosis not present

## 2019-01-21 DIAGNOSIS — N390 Urinary tract infection, site not specified: Secondary | ICD-10-CM | POA: Diagnosis not present

## 2019-01-21 DIAGNOSIS — A0472 Enterocolitis due to Clostridium difficile, not specified as recurrent: Secondary | ICD-10-CM | POA: Diagnosis not present

## 2019-01-21 DIAGNOSIS — Z7409 Other reduced mobility: Secondary | ICD-10-CM | POA: Diagnosis not present

## 2019-01-21 DIAGNOSIS — A4151 Sepsis due to Escherichia coli [E. coli]: Secondary | ICD-10-CM | POA: Diagnosis not present

## 2019-01-21 DIAGNOSIS — R2681 Unsteadiness on feet: Secondary | ICD-10-CM | POA: Diagnosis not present

## 2019-01-21 DIAGNOSIS — M2550 Pain in unspecified joint: Secondary | ICD-10-CM | POA: Diagnosis not present

## 2019-01-21 DIAGNOSIS — H353211 Exudative age-related macular degeneration, right eye, with active choroidal neovascularization: Secondary | ICD-10-CM | POA: Diagnosis not present

## 2019-01-21 DIAGNOSIS — B9620 Unspecified Escherichia coli [E. coli] as the cause of diseases classified elsewhere: Secondary | ICD-10-CM | POA: Diagnosis not present

## 2019-01-21 DIAGNOSIS — R509 Fever, unspecified: Secondary | ICD-10-CM | POA: Diagnosis not present

## 2019-01-21 DIAGNOSIS — H353121 Nonexudative age-related macular degeneration, left eye, early dry stage: Secondary | ICD-10-CM | POA: Diagnosis not present

## 2019-01-21 DIAGNOSIS — M6281 Muscle weakness (generalized): Secondary | ICD-10-CM | POA: Diagnosis not present

## 2019-01-21 DIAGNOSIS — R42 Dizziness and giddiness: Secondary | ICD-10-CM | POA: Diagnosis not present

## 2019-01-21 DIAGNOSIS — Z9181 History of falling: Secondary | ICD-10-CM | POA: Diagnosis not present

## 2019-01-21 DIAGNOSIS — A414 Sepsis due to anaerobes: Secondary | ICD-10-CM | POA: Diagnosis not present

## 2019-01-21 DIAGNOSIS — Z7401 Bed confinement status: Secondary | ICD-10-CM | POA: Diagnosis not present

## 2019-01-21 DIAGNOSIS — R6520 Severe sepsis without septic shock: Secondary | ICD-10-CM | POA: Diagnosis not present

## 2019-01-21 DIAGNOSIS — R531 Weakness: Secondary | ICD-10-CM | POA: Diagnosis not present

## 2019-01-21 DIAGNOSIS — Z7982 Long term (current) use of aspirin: Secondary | ICD-10-CM | POA: Diagnosis not present

## 2019-01-21 DIAGNOSIS — I959 Hypotension, unspecified: Secondary | ICD-10-CM | POA: Diagnosis not present

## 2019-01-23 DIAGNOSIS — R42 Dizziness and giddiness: Secondary | ICD-10-CM | POA: Diagnosis not present

## 2019-01-23 DIAGNOSIS — R531 Weakness: Secondary | ICD-10-CM | POA: Diagnosis not present

## 2019-01-23 DIAGNOSIS — I10 Essential (primary) hypertension: Secondary | ICD-10-CM | POA: Diagnosis not present

## 2019-01-23 DIAGNOSIS — A0472 Enterocolitis due to Clostridium difficile, not specified as recurrent: Secondary | ICD-10-CM | POA: Diagnosis not present

## 2019-04-10 DIAGNOSIS — R54 Age-related physical debility: Secondary | ICD-10-CM | POA: Diagnosis not present

## 2019-04-10 DIAGNOSIS — E785 Hyperlipidemia, unspecified: Secondary | ICD-10-CM | POA: Diagnosis not present

## 2019-04-10 DIAGNOSIS — Z8619 Personal history of other infectious and parasitic diseases: Secondary | ICD-10-CM | POA: Diagnosis not present

## 2019-04-10 DIAGNOSIS — I10 Essential (primary) hypertension: Secondary | ICD-10-CM | POA: Diagnosis not present

## 2019-04-15 DIAGNOSIS — H34831 Tributary (branch) retinal vein occlusion, right eye, with macular edema: Secondary | ICD-10-CM | POA: Diagnosis not present

## 2019-04-15 DIAGNOSIS — H35371 Puckering of macula, right eye: Secondary | ICD-10-CM | POA: Diagnosis not present

## 2019-04-15 DIAGNOSIS — H43813 Vitreous degeneration, bilateral: Secondary | ICD-10-CM | POA: Diagnosis not present

## 2019-04-15 DIAGNOSIS — H353121 Nonexudative age-related macular degeneration, left eye, early dry stage: Secondary | ICD-10-CM | POA: Diagnosis not present

## 2019-04-23 DIAGNOSIS — E559 Vitamin D deficiency, unspecified: Secondary | ICD-10-CM | POA: Diagnosis not present

## 2019-04-23 DIAGNOSIS — E785 Hyperlipidemia, unspecified: Secondary | ICD-10-CM | POA: Diagnosis not present

## 2019-04-23 DIAGNOSIS — R42 Dizziness and giddiness: Secondary | ICD-10-CM | POA: Diagnosis not present

## 2019-04-23 DIAGNOSIS — I10 Essential (primary) hypertension: Secondary | ICD-10-CM | POA: Diagnosis not present

## 2019-04-23 DIAGNOSIS — J302 Other seasonal allergic rhinitis: Secondary | ICD-10-CM | POA: Diagnosis not present

## 2019-04-23 DIAGNOSIS — R54 Age-related physical debility: Secondary | ICD-10-CM | POA: Diagnosis not present

## 2019-04-23 DIAGNOSIS — K219 Gastro-esophageal reflux disease without esophagitis: Secondary | ICD-10-CM | POA: Diagnosis not present

## 2019-05-18 DIAGNOSIS — I10 Essential (primary) hypertension: Secondary | ICD-10-CM | POA: Diagnosis not present

## 2019-05-18 DIAGNOSIS — D649 Anemia, unspecified: Secondary | ICD-10-CM | POA: Diagnosis not present

## 2019-05-18 DIAGNOSIS — Z5181 Encounter for therapeutic drug level monitoring: Secondary | ICD-10-CM | POA: Diagnosis not present

## 2019-06-17 DIAGNOSIS — B342 Coronavirus infection, unspecified: Secondary | ICD-10-CM | POA: Diagnosis not present

## 2019-07-10 DIAGNOSIS — H34831 Tributary (branch) retinal vein occlusion, right eye, with macular edema: Secondary | ICD-10-CM | POA: Diagnosis not present

## 2019-07-10 DIAGNOSIS — H353121 Nonexudative age-related macular degeneration, left eye, early dry stage: Secondary | ICD-10-CM | POA: Diagnosis not present

## 2019-07-10 DIAGNOSIS — H35371 Puckering of macula, right eye: Secondary | ICD-10-CM | POA: Diagnosis not present

## 2019-07-10 DIAGNOSIS — H3561 Retinal hemorrhage, right eye: Secondary | ICD-10-CM | POA: Diagnosis not present

## 2019-07-17 DIAGNOSIS — R54 Age-related physical debility: Secondary | ICD-10-CM | POA: Diagnosis not present

## 2019-07-17 DIAGNOSIS — I10 Essential (primary) hypertension: Secondary | ICD-10-CM | POA: Diagnosis not present

## 2019-07-17 DIAGNOSIS — F5101 Primary insomnia: Secondary | ICD-10-CM | POA: Diagnosis not present

## 2019-07-17 DIAGNOSIS — R42 Dizziness and giddiness: Secondary | ICD-10-CM | POA: Diagnosis not present

## 2019-08-27 DIAGNOSIS — M6281 Muscle weakness (generalized): Secondary | ICD-10-CM | POA: Diagnosis not present

## 2019-08-27 DIAGNOSIS — R2689 Other abnormalities of gait and mobility: Secondary | ICD-10-CM | POA: Diagnosis not present

## 2019-08-27 DIAGNOSIS — R42 Dizziness and giddiness: Secondary | ICD-10-CM | POA: Diagnosis not present

## 2019-08-27 DIAGNOSIS — R2681 Unsteadiness on feet: Secondary | ICD-10-CM | POA: Diagnosis not present

## 2019-08-28 DIAGNOSIS — R2689 Other abnormalities of gait and mobility: Secondary | ICD-10-CM | POA: Diagnosis not present

## 2019-08-28 DIAGNOSIS — R2681 Unsteadiness on feet: Secondary | ICD-10-CM | POA: Diagnosis not present

## 2019-08-28 DIAGNOSIS — R42 Dizziness and giddiness: Secondary | ICD-10-CM | POA: Diagnosis not present

## 2019-08-28 DIAGNOSIS — M6281 Muscle weakness (generalized): Secondary | ICD-10-CM | POA: Diagnosis not present

## 2019-09-01 DIAGNOSIS — R2681 Unsteadiness on feet: Secondary | ICD-10-CM | POA: Diagnosis not present

## 2019-09-01 DIAGNOSIS — M6281 Muscle weakness (generalized): Secondary | ICD-10-CM | POA: Diagnosis not present

## 2019-09-01 DIAGNOSIS — R2689 Other abnormalities of gait and mobility: Secondary | ICD-10-CM | POA: Diagnosis not present

## 2019-09-01 DIAGNOSIS — R42 Dizziness and giddiness: Secondary | ICD-10-CM | POA: Diagnosis not present

## 2019-09-03 DIAGNOSIS — M6281 Muscle weakness (generalized): Secondary | ICD-10-CM | POA: Diagnosis not present

## 2019-09-03 DIAGNOSIS — R42 Dizziness and giddiness: Secondary | ICD-10-CM | POA: Diagnosis not present

## 2019-09-03 DIAGNOSIS — R2689 Other abnormalities of gait and mobility: Secondary | ICD-10-CM | POA: Diagnosis not present

## 2019-09-03 DIAGNOSIS — R2681 Unsteadiness on feet: Secondary | ICD-10-CM | POA: Diagnosis not present

## 2019-09-04 DIAGNOSIS — J302 Other seasonal allergic rhinitis: Secondary | ICD-10-CM | POA: Diagnosis not present

## 2019-09-04 DIAGNOSIS — M6281 Muscle weakness (generalized): Secondary | ICD-10-CM | POA: Diagnosis not present

## 2019-09-04 DIAGNOSIS — R2681 Unsteadiness on feet: Secondary | ICD-10-CM | POA: Diagnosis not present

## 2019-09-04 DIAGNOSIS — R54 Age-related physical debility: Secondary | ICD-10-CM | POA: Diagnosis not present

## 2019-09-04 DIAGNOSIS — G4700 Insomnia, unspecified: Secondary | ICD-10-CM | POA: Diagnosis not present

## 2019-09-04 DIAGNOSIS — E785 Hyperlipidemia, unspecified: Secondary | ICD-10-CM | POA: Diagnosis not present

## 2019-09-04 DIAGNOSIS — E46 Unspecified protein-calorie malnutrition: Secondary | ICD-10-CM | POA: Diagnosis not present

## 2019-09-04 DIAGNOSIS — R2689 Other abnormalities of gait and mobility: Secondary | ICD-10-CM | POA: Diagnosis not present

## 2019-09-04 DIAGNOSIS — I10 Essential (primary) hypertension: Secondary | ICD-10-CM | POA: Diagnosis not present

## 2019-09-04 DIAGNOSIS — R42 Dizziness and giddiness: Secondary | ICD-10-CM | POA: Diagnosis not present

## 2019-09-08 DIAGNOSIS — M6281 Muscle weakness (generalized): Secondary | ICD-10-CM | POA: Diagnosis not present

## 2019-09-08 DIAGNOSIS — R2689 Other abnormalities of gait and mobility: Secondary | ICD-10-CM | POA: Diagnosis not present

## 2019-09-08 DIAGNOSIS — R42 Dizziness and giddiness: Secondary | ICD-10-CM | POA: Diagnosis not present

## 2019-09-08 DIAGNOSIS — R2681 Unsteadiness on feet: Secondary | ICD-10-CM | POA: Diagnosis not present

## 2019-09-10 DIAGNOSIS — R42 Dizziness and giddiness: Secondary | ICD-10-CM | POA: Diagnosis not present

## 2019-09-10 DIAGNOSIS — M6281 Muscle weakness (generalized): Secondary | ICD-10-CM | POA: Diagnosis not present

## 2019-09-10 DIAGNOSIS — R2681 Unsteadiness on feet: Secondary | ICD-10-CM | POA: Diagnosis not present

## 2019-09-10 DIAGNOSIS — R2689 Other abnormalities of gait and mobility: Secondary | ICD-10-CM | POA: Diagnosis not present

## 2019-09-11 DIAGNOSIS — R2681 Unsteadiness on feet: Secondary | ICD-10-CM | POA: Diagnosis not present

## 2019-09-11 DIAGNOSIS — R42 Dizziness and giddiness: Secondary | ICD-10-CM | POA: Diagnosis not present

## 2019-09-11 DIAGNOSIS — R2689 Other abnormalities of gait and mobility: Secondary | ICD-10-CM | POA: Diagnosis not present

## 2019-09-11 DIAGNOSIS — M6281 Muscle weakness (generalized): Secondary | ICD-10-CM | POA: Diagnosis not present

## 2019-09-15 DIAGNOSIS — R2681 Unsteadiness on feet: Secondary | ICD-10-CM | POA: Diagnosis not present

## 2019-09-15 DIAGNOSIS — M6281 Muscle weakness (generalized): Secondary | ICD-10-CM | POA: Diagnosis not present

## 2019-09-15 DIAGNOSIS — R42 Dizziness and giddiness: Secondary | ICD-10-CM | POA: Diagnosis not present

## 2019-09-15 DIAGNOSIS — R2689 Other abnormalities of gait and mobility: Secondary | ICD-10-CM | POA: Diagnosis not present

## 2019-09-17 DIAGNOSIS — R42 Dizziness and giddiness: Secondary | ICD-10-CM | POA: Diagnosis not present

## 2019-09-17 DIAGNOSIS — M6281 Muscle weakness (generalized): Secondary | ICD-10-CM | POA: Diagnosis not present

## 2019-09-17 DIAGNOSIS — R2681 Unsteadiness on feet: Secondary | ICD-10-CM | POA: Diagnosis not present

## 2019-09-17 DIAGNOSIS — R2689 Other abnormalities of gait and mobility: Secondary | ICD-10-CM | POA: Diagnosis not present

## 2019-09-21 DIAGNOSIS — M6281 Muscle weakness (generalized): Secondary | ICD-10-CM | POA: Diagnosis not present

## 2019-09-21 DIAGNOSIS — R42 Dizziness and giddiness: Secondary | ICD-10-CM | POA: Diagnosis not present

## 2019-09-21 DIAGNOSIS — R2689 Other abnormalities of gait and mobility: Secondary | ICD-10-CM | POA: Diagnosis not present

## 2019-09-21 DIAGNOSIS — R2681 Unsteadiness on feet: Secondary | ICD-10-CM | POA: Diagnosis not present

## 2019-09-23 DIAGNOSIS — R42 Dizziness and giddiness: Secondary | ICD-10-CM | POA: Diagnosis not present

## 2019-09-23 DIAGNOSIS — R2681 Unsteadiness on feet: Secondary | ICD-10-CM | POA: Diagnosis not present

## 2019-09-23 DIAGNOSIS — R2689 Other abnormalities of gait and mobility: Secondary | ICD-10-CM | POA: Diagnosis not present

## 2019-09-23 DIAGNOSIS — M6281 Muscle weakness (generalized): Secondary | ICD-10-CM | POA: Diagnosis not present

## 2019-09-25 DIAGNOSIS — M6281 Muscle weakness (generalized): Secondary | ICD-10-CM | POA: Diagnosis not present

## 2019-09-25 DIAGNOSIS — R2689 Other abnormalities of gait and mobility: Secondary | ICD-10-CM | POA: Diagnosis not present

## 2019-09-25 DIAGNOSIS — R42 Dizziness and giddiness: Secondary | ICD-10-CM | POA: Diagnosis not present

## 2019-09-25 DIAGNOSIS — R2681 Unsteadiness on feet: Secondary | ICD-10-CM | POA: Diagnosis not present

## 2019-09-29 DIAGNOSIS — R2689 Other abnormalities of gait and mobility: Secondary | ICD-10-CM | POA: Diagnosis not present

## 2019-09-29 DIAGNOSIS — R42 Dizziness and giddiness: Secondary | ICD-10-CM | POA: Diagnosis not present

## 2019-09-29 DIAGNOSIS — M6281 Muscle weakness (generalized): Secondary | ICD-10-CM | POA: Diagnosis not present

## 2019-09-29 DIAGNOSIS — R2681 Unsteadiness on feet: Secondary | ICD-10-CM | POA: Diagnosis not present

## 2019-09-30 DIAGNOSIS — R2689 Other abnormalities of gait and mobility: Secondary | ICD-10-CM | POA: Diagnosis not present

## 2019-09-30 DIAGNOSIS — M6281 Muscle weakness (generalized): Secondary | ICD-10-CM | POA: Diagnosis not present

## 2019-09-30 DIAGNOSIS — R42 Dizziness and giddiness: Secondary | ICD-10-CM | POA: Diagnosis not present

## 2019-09-30 DIAGNOSIS — R2681 Unsteadiness on feet: Secondary | ICD-10-CM | POA: Diagnosis not present

## 2019-10-02 DIAGNOSIS — R2689 Other abnormalities of gait and mobility: Secondary | ICD-10-CM | POA: Diagnosis not present

## 2019-10-02 DIAGNOSIS — R42 Dizziness and giddiness: Secondary | ICD-10-CM | POA: Diagnosis not present

## 2019-10-02 DIAGNOSIS — M6281 Muscle weakness (generalized): Secondary | ICD-10-CM | POA: Diagnosis not present

## 2019-10-02 DIAGNOSIS — R2681 Unsteadiness on feet: Secondary | ICD-10-CM | POA: Diagnosis not present

## 2019-10-05 DIAGNOSIS — M6281 Muscle weakness (generalized): Secondary | ICD-10-CM | POA: Diagnosis not present

## 2019-10-05 DIAGNOSIS — R42 Dizziness and giddiness: Secondary | ICD-10-CM | POA: Diagnosis not present

## 2019-10-05 DIAGNOSIS — R2689 Other abnormalities of gait and mobility: Secondary | ICD-10-CM | POA: Diagnosis not present

## 2019-10-05 DIAGNOSIS — R2681 Unsteadiness on feet: Secondary | ICD-10-CM | POA: Diagnosis not present

## 2019-10-07 DIAGNOSIS — M6281 Muscle weakness (generalized): Secondary | ICD-10-CM | POA: Diagnosis not present

## 2019-10-07 DIAGNOSIS — R2689 Other abnormalities of gait and mobility: Secondary | ICD-10-CM | POA: Diagnosis not present

## 2019-10-07 DIAGNOSIS — R42 Dizziness and giddiness: Secondary | ICD-10-CM | POA: Diagnosis not present

## 2019-10-07 DIAGNOSIS — R2681 Unsteadiness on feet: Secondary | ICD-10-CM | POA: Diagnosis not present

## 2019-10-09 DIAGNOSIS — R2689 Other abnormalities of gait and mobility: Secondary | ICD-10-CM | POA: Diagnosis not present

## 2019-10-09 DIAGNOSIS — R2681 Unsteadiness on feet: Secondary | ICD-10-CM | POA: Diagnosis not present

## 2019-10-09 DIAGNOSIS — R42 Dizziness and giddiness: Secondary | ICD-10-CM | POA: Diagnosis not present

## 2019-10-09 DIAGNOSIS — M6281 Muscle weakness (generalized): Secondary | ICD-10-CM | POA: Diagnosis not present

## 2019-10-12 DIAGNOSIS — R2681 Unsteadiness on feet: Secondary | ICD-10-CM | POA: Diagnosis not present

## 2019-10-12 DIAGNOSIS — R2689 Other abnormalities of gait and mobility: Secondary | ICD-10-CM | POA: Diagnosis not present

## 2019-10-12 DIAGNOSIS — M6281 Muscle weakness (generalized): Secondary | ICD-10-CM | POA: Diagnosis not present

## 2019-10-12 DIAGNOSIS — R42 Dizziness and giddiness: Secondary | ICD-10-CM | POA: Diagnosis not present

## 2019-10-14 DIAGNOSIS — I10 Essential (primary) hypertension: Secondary | ICD-10-CM | POA: Diagnosis not present

## 2019-10-23 DIAGNOSIS — H353121 Nonexudative age-related macular degeneration, left eye, early dry stage: Secondary | ICD-10-CM | POA: Diagnosis not present

## 2019-10-23 DIAGNOSIS — H34831 Tributary (branch) retinal vein occlusion, right eye, with macular edema: Secondary | ICD-10-CM | POA: Diagnosis not present

## 2019-10-23 DIAGNOSIS — H35371 Puckering of macula, right eye: Secondary | ICD-10-CM | POA: Diagnosis not present

## 2019-10-23 DIAGNOSIS — H43813 Vitreous degeneration, bilateral: Secondary | ICD-10-CM | POA: Diagnosis not present

## 2019-11-10 DIAGNOSIS — H409 Unspecified glaucoma: Secondary | ICD-10-CM | POA: Diagnosis not present

## 2019-11-10 DIAGNOSIS — A0471 Enterocolitis due to Clostridium difficile, recurrent: Secondary | ICD-10-CM | POA: Diagnosis not present

## 2019-11-10 DIAGNOSIS — I10 Essential (primary) hypertension: Secondary | ICD-10-CM | POA: Diagnosis not present

## 2019-11-10 DIAGNOSIS — E785 Hyperlipidemia, unspecified: Secondary | ICD-10-CM | POA: Diagnosis not present

## 2019-11-10 DIAGNOSIS — R42 Dizziness and giddiness: Secondary | ICD-10-CM | POA: Diagnosis not present

## 2019-11-10 DIAGNOSIS — N390 Urinary tract infection, site not specified: Secondary | ICD-10-CM | POA: Diagnosis not present

## 2019-11-10 DIAGNOSIS — U071 COVID-19: Secondary | ICD-10-CM | POA: Diagnosis not present

## 2019-11-10 DIAGNOSIS — H353121 Nonexudative age-related macular degeneration, left eye, early dry stage: Secondary | ICD-10-CM | POA: Diagnosis not present

## 2019-11-10 DIAGNOSIS — Z7982 Long term (current) use of aspirin: Secondary | ICD-10-CM | POA: Diagnosis not present

## 2019-11-10 DIAGNOSIS — H353211 Exudative age-related macular degeneration, right eye, with active choroidal neovascularization: Secondary | ICD-10-CM | POA: Diagnosis not present

## 2019-11-10 DIAGNOSIS — A4151 Sepsis due to Escherichia coli [E. coli]: Secondary | ICD-10-CM | POA: Diagnosis not present

## 2019-11-10 DIAGNOSIS — N3090 Cystitis, unspecified without hematuria: Secondary | ICD-10-CM | POA: Diagnosis not present

## 2019-12-09 DIAGNOSIS — R2681 Unsteadiness on feet: Secondary | ICD-10-CM | POA: Diagnosis not present

## 2019-12-09 DIAGNOSIS — R42 Dizziness and giddiness: Secondary | ICD-10-CM | POA: Diagnosis not present

## 2019-12-09 DIAGNOSIS — R2689 Other abnormalities of gait and mobility: Secondary | ICD-10-CM | POA: Diagnosis not present

## 2019-12-09 DIAGNOSIS — M6281 Muscle weakness (generalized): Secondary | ICD-10-CM | POA: Diagnosis not present

## 2019-12-10 DIAGNOSIS — R2689 Other abnormalities of gait and mobility: Secondary | ICD-10-CM | POA: Diagnosis not present

## 2019-12-10 DIAGNOSIS — R42 Dizziness and giddiness: Secondary | ICD-10-CM | POA: Diagnosis not present

## 2019-12-10 DIAGNOSIS — M6281 Muscle weakness (generalized): Secondary | ICD-10-CM | POA: Diagnosis not present

## 2019-12-10 DIAGNOSIS — R2681 Unsteadiness on feet: Secondary | ICD-10-CM | POA: Diagnosis not present

## 2019-12-14 DIAGNOSIS — R42 Dizziness and giddiness: Secondary | ICD-10-CM | POA: Diagnosis not present

## 2019-12-14 DIAGNOSIS — R2689 Other abnormalities of gait and mobility: Secondary | ICD-10-CM | POA: Diagnosis not present

## 2019-12-14 DIAGNOSIS — M6281 Muscle weakness (generalized): Secondary | ICD-10-CM | POA: Diagnosis not present

## 2019-12-14 DIAGNOSIS — R2681 Unsteadiness on feet: Secondary | ICD-10-CM | POA: Diagnosis not present

## 2019-12-15 DIAGNOSIS — R42 Dizziness and giddiness: Secondary | ICD-10-CM | POA: Diagnosis not present

## 2019-12-15 DIAGNOSIS — R2689 Other abnormalities of gait and mobility: Secondary | ICD-10-CM | POA: Diagnosis not present

## 2019-12-15 DIAGNOSIS — R2681 Unsteadiness on feet: Secondary | ICD-10-CM | POA: Diagnosis not present

## 2019-12-15 DIAGNOSIS — M6281 Muscle weakness (generalized): Secondary | ICD-10-CM | POA: Diagnosis not present

## 2019-12-17 DIAGNOSIS — R2681 Unsteadiness on feet: Secondary | ICD-10-CM | POA: Diagnosis not present

## 2019-12-17 DIAGNOSIS — R2689 Other abnormalities of gait and mobility: Secondary | ICD-10-CM | POA: Diagnosis not present

## 2019-12-17 DIAGNOSIS — R42 Dizziness and giddiness: Secondary | ICD-10-CM | POA: Diagnosis not present

## 2019-12-17 DIAGNOSIS — M6281 Muscle weakness (generalized): Secondary | ICD-10-CM | POA: Diagnosis not present

## 2019-12-18 DIAGNOSIS — R42 Dizziness and giddiness: Secondary | ICD-10-CM | POA: Diagnosis not present

## 2019-12-18 DIAGNOSIS — M6281 Muscle weakness (generalized): Secondary | ICD-10-CM | POA: Diagnosis not present

## 2019-12-18 DIAGNOSIS — R2689 Other abnormalities of gait and mobility: Secondary | ICD-10-CM | POA: Diagnosis not present

## 2019-12-18 DIAGNOSIS — R2681 Unsteadiness on feet: Secondary | ICD-10-CM | POA: Diagnosis not present

## 2019-12-18 DIAGNOSIS — Z23 Encounter for immunization: Secondary | ICD-10-CM | POA: Diagnosis not present

## 2019-12-22 DIAGNOSIS — R2681 Unsteadiness on feet: Secondary | ICD-10-CM | POA: Diagnosis not present

## 2019-12-22 DIAGNOSIS — M6281 Muscle weakness (generalized): Secondary | ICD-10-CM | POA: Diagnosis not present

## 2019-12-22 DIAGNOSIS — R42 Dizziness and giddiness: Secondary | ICD-10-CM | POA: Diagnosis not present

## 2019-12-22 DIAGNOSIS — R2689 Other abnormalities of gait and mobility: Secondary | ICD-10-CM | POA: Diagnosis not present

## 2019-12-23 DIAGNOSIS — M6281 Muscle weakness (generalized): Secondary | ICD-10-CM | POA: Diagnosis not present

## 2019-12-23 DIAGNOSIS — R42 Dizziness and giddiness: Secondary | ICD-10-CM | POA: Diagnosis not present

## 2019-12-23 DIAGNOSIS — R2681 Unsteadiness on feet: Secondary | ICD-10-CM | POA: Diagnosis not present

## 2019-12-23 DIAGNOSIS — R2689 Other abnormalities of gait and mobility: Secondary | ICD-10-CM | POA: Diagnosis not present

## 2019-12-24 DIAGNOSIS — R2681 Unsteadiness on feet: Secondary | ICD-10-CM | POA: Diagnosis not present

## 2019-12-24 DIAGNOSIS — R2689 Other abnormalities of gait and mobility: Secondary | ICD-10-CM | POA: Diagnosis not present

## 2019-12-24 DIAGNOSIS — M6281 Muscle weakness (generalized): Secondary | ICD-10-CM | POA: Diagnosis not present

## 2019-12-24 DIAGNOSIS — R42 Dizziness and giddiness: Secondary | ICD-10-CM | POA: Diagnosis not present

## 2019-12-25 DIAGNOSIS — R2681 Unsteadiness on feet: Secondary | ICD-10-CM | POA: Diagnosis not present

## 2019-12-25 DIAGNOSIS — R42 Dizziness and giddiness: Secondary | ICD-10-CM | POA: Diagnosis not present

## 2019-12-25 DIAGNOSIS — M6281 Muscle weakness (generalized): Secondary | ICD-10-CM | POA: Diagnosis not present

## 2019-12-25 DIAGNOSIS — R2689 Other abnormalities of gait and mobility: Secondary | ICD-10-CM | POA: Diagnosis not present

## 2019-12-28 DIAGNOSIS — R2681 Unsteadiness on feet: Secondary | ICD-10-CM | POA: Diagnosis not present

## 2019-12-28 DIAGNOSIS — R42 Dizziness and giddiness: Secondary | ICD-10-CM | POA: Diagnosis not present

## 2019-12-28 DIAGNOSIS — R2689 Other abnormalities of gait and mobility: Secondary | ICD-10-CM | POA: Diagnosis not present

## 2019-12-28 DIAGNOSIS — M6281 Muscle weakness (generalized): Secondary | ICD-10-CM | POA: Diagnosis not present

## 2019-12-29 DIAGNOSIS — M6281 Muscle weakness (generalized): Secondary | ICD-10-CM | POA: Diagnosis not present

## 2019-12-29 DIAGNOSIS — R42 Dizziness and giddiness: Secondary | ICD-10-CM | POA: Diagnosis not present

## 2019-12-29 DIAGNOSIS — R2681 Unsteadiness on feet: Secondary | ICD-10-CM | POA: Diagnosis not present

## 2019-12-29 DIAGNOSIS — R2689 Other abnormalities of gait and mobility: Secondary | ICD-10-CM | POA: Diagnosis not present

## 2019-12-30 DIAGNOSIS — R42 Dizziness and giddiness: Secondary | ICD-10-CM | POA: Diagnosis not present

## 2019-12-30 DIAGNOSIS — R2689 Other abnormalities of gait and mobility: Secondary | ICD-10-CM | POA: Diagnosis not present

## 2019-12-30 DIAGNOSIS — M6281 Muscle weakness (generalized): Secondary | ICD-10-CM | POA: Diagnosis not present

## 2019-12-30 DIAGNOSIS — R2681 Unsteadiness on feet: Secondary | ICD-10-CM | POA: Diagnosis not present

## 2019-12-31 DIAGNOSIS — R42 Dizziness and giddiness: Secondary | ICD-10-CM | POA: Diagnosis not present

## 2019-12-31 DIAGNOSIS — I10 Essential (primary) hypertension: Secondary | ICD-10-CM | POA: Diagnosis not present

## 2019-12-31 DIAGNOSIS — E785 Hyperlipidemia, unspecified: Secondary | ICD-10-CM | POA: Diagnosis not present

## 2019-12-31 DIAGNOSIS — D649 Anemia, unspecified: Secondary | ICD-10-CM | POA: Diagnosis not present

## 2019-12-31 DIAGNOSIS — G4700 Insomnia, unspecified: Secondary | ICD-10-CM | POA: Diagnosis not present

## 2019-12-31 DIAGNOSIS — R54 Age-related physical debility: Secondary | ICD-10-CM | POA: Diagnosis not present

## 2019-12-31 DIAGNOSIS — Z889 Allergy status to unspecified drugs, medicaments and biological substances status: Secondary | ICD-10-CM | POA: Diagnosis not present

## 2019-12-31 DIAGNOSIS — R2681 Unsteadiness on feet: Secondary | ICD-10-CM | POA: Diagnosis not present

## 2019-12-31 DIAGNOSIS — E46 Unspecified protein-calorie malnutrition: Secondary | ICD-10-CM | POA: Diagnosis not present

## 2019-12-31 DIAGNOSIS — E559 Vitamin D deficiency, unspecified: Secondary | ICD-10-CM | POA: Diagnosis not present

## 2019-12-31 DIAGNOSIS — R2689 Other abnormalities of gait and mobility: Secondary | ICD-10-CM | POA: Diagnosis not present

## 2019-12-31 DIAGNOSIS — M6281 Muscle weakness (generalized): Secondary | ICD-10-CM | POA: Diagnosis not present

## 2020-01-01 DIAGNOSIS — R42 Dizziness and giddiness: Secondary | ICD-10-CM | POA: Diagnosis not present

## 2020-01-01 DIAGNOSIS — R2681 Unsteadiness on feet: Secondary | ICD-10-CM | POA: Diagnosis not present

## 2020-01-01 DIAGNOSIS — M6281 Muscle weakness (generalized): Secondary | ICD-10-CM | POA: Diagnosis not present

## 2020-01-01 DIAGNOSIS — R2689 Other abnormalities of gait and mobility: Secondary | ICD-10-CM | POA: Diagnosis not present

## 2020-01-04 DIAGNOSIS — R2681 Unsteadiness on feet: Secondary | ICD-10-CM | POA: Diagnosis not present

## 2020-01-04 DIAGNOSIS — M6281 Muscle weakness (generalized): Secondary | ICD-10-CM | POA: Diagnosis not present

## 2020-01-04 DIAGNOSIS — R42 Dizziness and giddiness: Secondary | ICD-10-CM | POA: Diagnosis not present

## 2020-01-04 DIAGNOSIS — R2689 Other abnormalities of gait and mobility: Secondary | ICD-10-CM | POA: Diagnosis not present

## 2020-01-05 DIAGNOSIS — R2681 Unsteadiness on feet: Secondary | ICD-10-CM | POA: Diagnosis not present

## 2020-01-05 DIAGNOSIS — R42 Dizziness and giddiness: Secondary | ICD-10-CM | POA: Diagnosis not present

## 2020-01-05 DIAGNOSIS — M6281 Muscle weakness (generalized): Secondary | ICD-10-CM | POA: Diagnosis not present

## 2020-01-05 DIAGNOSIS — R2689 Other abnormalities of gait and mobility: Secondary | ICD-10-CM | POA: Diagnosis not present

## 2020-01-06 DIAGNOSIS — R2689 Other abnormalities of gait and mobility: Secondary | ICD-10-CM | POA: Diagnosis not present

## 2020-01-06 DIAGNOSIS — M6281 Muscle weakness (generalized): Secondary | ICD-10-CM | POA: Diagnosis not present

## 2020-01-06 DIAGNOSIS — R2681 Unsteadiness on feet: Secondary | ICD-10-CM | POA: Diagnosis not present

## 2020-01-06 DIAGNOSIS — R42 Dizziness and giddiness: Secondary | ICD-10-CM | POA: Diagnosis not present

## 2020-01-11 DIAGNOSIS — R2689 Other abnormalities of gait and mobility: Secondary | ICD-10-CM | POA: Diagnosis not present

## 2020-01-11 DIAGNOSIS — M6281 Muscle weakness (generalized): Secondary | ICD-10-CM | POA: Diagnosis not present

## 2020-01-11 DIAGNOSIS — R42 Dizziness and giddiness: Secondary | ICD-10-CM | POA: Diagnosis not present

## 2020-01-11 DIAGNOSIS — R2681 Unsteadiness on feet: Secondary | ICD-10-CM | POA: Diagnosis not present

## 2020-01-12 DIAGNOSIS — R2689 Other abnormalities of gait and mobility: Secondary | ICD-10-CM | POA: Diagnosis not present

## 2020-01-12 DIAGNOSIS — R2681 Unsteadiness on feet: Secondary | ICD-10-CM | POA: Diagnosis not present

## 2020-01-12 DIAGNOSIS — Z23 Encounter for immunization: Secondary | ICD-10-CM | POA: Diagnosis not present

## 2020-01-12 DIAGNOSIS — M6281 Muscle weakness (generalized): Secondary | ICD-10-CM | POA: Diagnosis not present

## 2020-01-12 DIAGNOSIS — R42 Dizziness and giddiness: Secondary | ICD-10-CM | POA: Diagnosis not present

## 2020-01-12 DIAGNOSIS — G4700 Insomnia, unspecified: Secondary | ICD-10-CM | POA: Diagnosis not present

## 2020-01-18 DIAGNOSIS — M6281 Muscle weakness (generalized): Secondary | ICD-10-CM | POA: Diagnosis not present

## 2020-01-18 DIAGNOSIS — R42 Dizziness and giddiness: Secondary | ICD-10-CM | POA: Diagnosis not present

## 2020-01-18 DIAGNOSIS — R2689 Other abnormalities of gait and mobility: Secondary | ICD-10-CM | POA: Diagnosis not present

## 2020-01-19 DIAGNOSIS — M6281 Muscle weakness (generalized): Secondary | ICD-10-CM | POA: Diagnosis not present

## 2020-01-19 DIAGNOSIS — R42 Dizziness and giddiness: Secondary | ICD-10-CM | POA: Diagnosis not present

## 2020-01-19 DIAGNOSIS — R2689 Other abnormalities of gait and mobility: Secondary | ICD-10-CM | POA: Diagnosis not present

## 2020-01-21 DIAGNOSIS — M6281 Muscle weakness (generalized): Secondary | ICD-10-CM | POA: Diagnosis not present

## 2020-01-21 DIAGNOSIS — R42 Dizziness and giddiness: Secondary | ICD-10-CM | POA: Diagnosis not present

## 2020-01-21 DIAGNOSIS — R2689 Other abnormalities of gait and mobility: Secondary | ICD-10-CM | POA: Diagnosis not present

## 2020-01-25 DIAGNOSIS — M6281 Muscle weakness (generalized): Secondary | ICD-10-CM | POA: Diagnosis not present

## 2020-01-25 DIAGNOSIS — R42 Dizziness and giddiness: Secondary | ICD-10-CM | POA: Diagnosis not present

## 2020-01-25 DIAGNOSIS — R2689 Other abnormalities of gait and mobility: Secondary | ICD-10-CM | POA: Diagnosis not present

## 2020-03-04 DIAGNOSIS — H35371 Puckering of macula, right eye: Secondary | ICD-10-CM | POA: Diagnosis not present

## 2020-03-04 DIAGNOSIS — H43813 Vitreous degeneration, bilateral: Secondary | ICD-10-CM | POA: Diagnosis not present

## 2020-03-04 DIAGNOSIS — H34831 Tributary (branch) retinal vein occlusion, right eye, with macular edema: Secondary | ICD-10-CM | POA: Diagnosis not present

## 2020-03-04 DIAGNOSIS — H353122 Nonexudative age-related macular degeneration, left eye, intermediate dry stage: Secondary | ICD-10-CM | POA: Diagnosis not present

## 2020-03-11 DIAGNOSIS — R112 Nausea with vomiting, unspecified: Secondary | ICD-10-CM | POA: Diagnosis not present

## 2020-03-11 DIAGNOSIS — R42 Dizziness and giddiness: Secondary | ICD-10-CM | POA: Diagnosis not present

## 2020-03-11 DIAGNOSIS — F5101 Primary insomnia: Secondary | ICD-10-CM | POA: Diagnosis not present

## 2020-03-11 DIAGNOSIS — R54 Age-related physical debility: Secondary | ICD-10-CM | POA: Diagnosis not present

## 2020-04-05 DIAGNOSIS — H401132 Primary open-angle glaucoma, bilateral, moderate stage: Secondary | ICD-10-CM | POA: Diagnosis not present

## 2020-04-11 DIAGNOSIS — I10 Essential (primary) hypertension: Secondary | ICD-10-CM | POA: Diagnosis not present

## 2020-04-11 DIAGNOSIS — E785 Hyperlipidemia, unspecified: Secondary | ICD-10-CM | POA: Diagnosis not present

## 2020-04-22 DIAGNOSIS — I10 Essential (primary) hypertension: Secondary | ICD-10-CM | POA: Diagnosis not present

## 2020-04-22 DIAGNOSIS — E785 Hyperlipidemia, unspecified: Secondary | ICD-10-CM | POA: Diagnosis not present

## 2020-04-22 DIAGNOSIS — J302 Other seasonal allergic rhinitis: Secondary | ICD-10-CM | POA: Diagnosis not present

## 2020-04-22 DIAGNOSIS — R54 Age-related physical debility: Secondary | ICD-10-CM | POA: Diagnosis not present

## 2020-04-22 DIAGNOSIS — Z8619 Personal history of other infectious and parasitic diseases: Secondary | ICD-10-CM | POA: Diagnosis not present

## 2020-04-22 DIAGNOSIS — R42 Dizziness and giddiness: Secondary | ICD-10-CM | POA: Diagnosis not present

## 2020-04-22 DIAGNOSIS — E46 Unspecified protein-calorie malnutrition: Secondary | ICD-10-CM | POA: Diagnosis not present

## 2020-04-22 DIAGNOSIS — G4700 Insomnia, unspecified: Secondary | ICD-10-CM | POA: Diagnosis not present

## 2020-04-27 DIAGNOSIS — M170 Bilateral primary osteoarthritis of knee: Secondary | ICD-10-CM | POA: Diagnosis not present

## 2020-06-14 DIAGNOSIS — R531 Weakness: Secondary | ICD-10-CM | POA: Diagnosis not present

## 2020-06-21 DIAGNOSIS — L723 Sebaceous cyst: Secondary | ICD-10-CM | POA: Diagnosis not present

## 2020-06-21 DIAGNOSIS — D485 Neoplasm of uncertain behavior of skin: Secondary | ICD-10-CM | POA: Diagnosis not present

## 2020-06-21 DIAGNOSIS — L821 Other seborrheic keratosis: Secondary | ICD-10-CM | POA: Diagnosis not present

## 2020-06-21 DIAGNOSIS — D1801 Hemangioma of skin and subcutaneous tissue: Secondary | ICD-10-CM | POA: Diagnosis not present

## 2020-06-21 DIAGNOSIS — L814 Other melanin hyperpigmentation: Secondary | ICD-10-CM | POA: Diagnosis not present

## 2020-07-06 DIAGNOSIS — M6281 Muscle weakness (generalized): Secondary | ICD-10-CM | POA: Diagnosis not present

## 2020-07-06 DIAGNOSIS — R2681 Unsteadiness on feet: Secondary | ICD-10-CM | POA: Diagnosis not present

## 2020-07-06 DIAGNOSIS — Z9181 History of falling: Secondary | ICD-10-CM | POA: Diagnosis not present

## 2020-07-06 DIAGNOSIS — R2689 Other abnormalities of gait and mobility: Secondary | ICD-10-CM | POA: Diagnosis not present

## 2020-07-07 DIAGNOSIS — Z9181 History of falling: Secondary | ICD-10-CM | POA: Diagnosis not present

## 2020-07-07 DIAGNOSIS — R2689 Other abnormalities of gait and mobility: Secondary | ICD-10-CM | POA: Diagnosis not present

## 2020-07-07 DIAGNOSIS — M6281 Muscle weakness (generalized): Secondary | ICD-10-CM | POA: Diagnosis not present

## 2020-07-07 DIAGNOSIS — R2681 Unsteadiness on feet: Secondary | ICD-10-CM | POA: Diagnosis not present

## 2020-07-11 DIAGNOSIS — R2689 Other abnormalities of gait and mobility: Secondary | ICD-10-CM | POA: Diagnosis not present

## 2020-07-11 DIAGNOSIS — R2681 Unsteadiness on feet: Secondary | ICD-10-CM | POA: Diagnosis not present

## 2020-07-11 DIAGNOSIS — Z9181 History of falling: Secondary | ICD-10-CM | POA: Diagnosis not present

## 2020-07-11 DIAGNOSIS — M6281 Muscle weakness (generalized): Secondary | ICD-10-CM | POA: Diagnosis not present

## 2020-07-15 DIAGNOSIS — I10 Essential (primary) hypertension: Secondary | ICD-10-CM | POA: Diagnosis not present

## 2020-07-15 DIAGNOSIS — A0472 Enterocolitis due to Clostridium difficile, not specified as recurrent: Secondary | ICD-10-CM | POA: Diagnosis not present

## 2020-07-15 DIAGNOSIS — R42 Dizziness and giddiness: Secondary | ICD-10-CM | POA: Diagnosis not present

## 2020-07-15 DIAGNOSIS — R600 Localized edema: Secondary | ICD-10-CM | POA: Diagnosis not present

## 2020-07-15 DIAGNOSIS — E785 Hyperlipidemia, unspecified: Secondary | ICD-10-CM | POA: Diagnosis not present

## 2020-07-15 DIAGNOSIS — R54 Age-related physical debility: Secondary | ICD-10-CM | POA: Diagnosis not present

## 2020-07-18 DIAGNOSIS — R2689 Other abnormalities of gait and mobility: Secondary | ICD-10-CM | POA: Diagnosis not present

## 2020-07-18 DIAGNOSIS — Z9181 History of falling: Secondary | ICD-10-CM | POA: Diagnosis not present

## 2020-07-18 DIAGNOSIS — R2681 Unsteadiness on feet: Secondary | ICD-10-CM | POA: Diagnosis not present

## 2020-07-18 DIAGNOSIS — M6281 Muscle weakness (generalized): Secondary | ICD-10-CM | POA: Diagnosis not present

## 2020-07-19 DIAGNOSIS — R2689 Other abnormalities of gait and mobility: Secondary | ICD-10-CM | POA: Diagnosis not present

## 2020-07-19 DIAGNOSIS — M6281 Muscle weakness (generalized): Secondary | ICD-10-CM | POA: Diagnosis not present

## 2020-07-19 DIAGNOSIS — R2681 Unsteadiness on feet: Secondary | ICD-10-CM | POA: Diagnosis not present

## 2020-07-19 DIAGNOSIS — Z9181 History of falling: Secondary | ICD-10-CM | POA: Diagnosis not present

## 2020-07-21 DIAGNOSIS — M6281 Muscle weakness (generalized): Secondary | ICD-10-CM | POA: Diagnosis not present

## 2020-07-21 DIAGNOSIS — R2681 Unsteadiness on feet: Secondary | ICD-10-CM | POA: Diagnosis not present

## 2020-07-21 DIAGNOSIS — Z9181 History of falling: Secondary | ICD-10-CM | POA: Diagnosis not present

## 2020-07-21 DIAGNOSIS — R2689 Other abnormalities of gait and mobility: Secondary | ICD-10-CM | POA: Diagnosis not present

## 2020-07-26 DIAGNOSIS — R2681 Unsteadiness on feet: Secondary | ICD-10-CM | POA: Diagnosis not present

## 2020-07-26 DIAGNOSIS — R2689 Other abnormalities of gait and mobility: Secondary | ICD-10-CM | POA: Diagnosis not present

## 2020-07-26 DIAGNOSIS — Z9181 History of falling: Secondary | ICD-10-CM | POA: Diagnosis not present

## 2020-07-26 DIAGNOSIS — M6281 Muscle weakness (generalized): Secondary | ICD-10-CM | POA: Diagnosis not present

## 2020-07-27 DIAGNOSIS — R2689 Other abnormalities of gait and mobility: Secondary | ICD-10-CM | POA: Diagnosis not present

## 2020-07-27 DIAGNOSIS — Z9181 History of falling: Secondary | ICD-10-CM | POA: Diagnosis not present

## 2020-07-27 DIAGNOSIS — M6281 Muscle weakness (generalized): Secondary | ICD-10-CM | POA: Diagnosis not present

## 2020-07-27 DIAGNOSIS — R2681 Unsteadiness on feet: Secondary | ICD-10-CM | POA: Diagnosis not present

## 2020-07-29 DIAGNOSIS — R2689 Other abnormalities of gait and mobility: Secondary | ICD-10-CM | POA: Diagnosis not present

## 2020-07-29 DIAGNOSIS — Z9181 History of falling: Secondary | ICD-10-CM | POA: Diagnosis not present

## 2020-07-29 DIAGNOSIS — M6281 Muscle weakness (generalized): Secondary | ICD-10-CM | POA: Diagnosis not present

## 2020-07-29 DIAGNOSIS — R2681 Unsteadiness on feet: Secondary | ICD-10-CM | POA: Diagnosis not present

## 2020-08-01 DIAGNOSIS — M6281 Muscle weakness (generalized): Secondary | ICD-10-CM | POA: Diagnosis not present

## 2020-08-01 DIAGNOSIS — Z9181 History of falling: Secondary | ICD-10-CM | POA: Diagnosis not present

## 2020-08-01 DIAGNOSIS — R2689 Other abnormalities of gait and mobility: Secondary | ICD-10-CM | POA: Diagnosis not present

## 2020-08-01 DIAGNOSIS — R2681 Unsteadiness on feet: Secondary | ICD-10-CM | POA: Diagnosis not present

## 2020-08-03 DIAGNOSIS — R2681 Unsteadiness on feet: Secondary | ICD-10-CM | POA: Diagnosis not present

## 2020-08-03 DIAGNOSIS — Z9181 History of falling: Secondary | ICD-10-CM | POA: Diagnosis not present

## 2020-08-03 DIAGNOSIS — M6281 Muscle weakness (generalized): Secondary | ICD-10-CM | POA: Diagnosis not present

## 2020-08-03 DIAGNOSIS — R2689 Other abnormalities of gait and mobility: Secondary | ICD-10-CM | POA: Diagnosis not present

## 2020-08-04 DIAGNOSIS — Z9181 History of falling: Secondary | ICD-10-CM | POA: Diagnosis not present

## 2020-08-04 DIAGNOSIS — R2681 Unsteadiness on feet: Secondary | ICD-10-CM | POA: Diagnosis not present

## 2020-08-04 DIAGNOSIS — R2689 Other abnormalities of gait and mobility: Secondary | ICD-10-CM | POA: Diagnosis not present

## 2020-08-04 DIAGNOSIS — M6281 Muscle weakness (generalized): Secondary | ICD-10-CM | POA: Diagnosis not present

## 2020-08-08 DIAGNOSIS — Z9181 History of falling: Secondary | ICD-10-CM | POA: Diagnosis not present

## 2020-08-08 DIAGNOSIS — M6281 Muscle weakness (generalized): Secondary | ICD-10-CM | POA: Diagnosis not present

## 2020-08-08 DIAGNOSIS — R2681 Unsteadiness on feet: Secondary | ICD-10-CM | POA: Diagnosis not present

## 2020-08-08 DIAGNOSIS — R2689 Other abnormalities of gait and mobility: Secondary | ICD-10-CM | POA: Diagnosis not present

## 2020-08-10 DIAGNOSIS — R2681 Unsteadiness on feet: Secondary | ICD-10-CM | POA: Diagnosis not present

## 2020-08-10 DIAGNOSIS — R2689 Other abnormalities of gait and mobility: Secondary | ICD-10-CM | POA: Diagnosis not present

## 2020-08-10 DIAGNOSIS — Z9181 History of falling: Secondary | ICD-10-CM | POA: Diagnosis not present

## 2020-08-10 DIAGNOSIS — M6281 Muscle weakness (generalized): Secondary | ICD-10-CM | POA: Diagnosis not present

## 2020-08-12 DIAGNOSIS — Z9181 History of falling: Secondary | ICD-10-CM | POA: Diagnosis not present

## 2020-08-12 DIAGNOSIS — R2689 Other abnormalities of gait and mobility: Secondary | ICD-10-CM | POA: Diagnosis not present

## 2020-08-12 DIAGNOSIS — M6281 Muscle weakness (generalized): Secondary | ICD-10-CM | POA: Diagnosis not present

## 2020-08-12 DIAGNOSIS — R2681 Unsteadiness on feet: Secondary | ICD-10-CM | POA: Diagnosis not present

## 2020-08-15 DIAGNOSIS — R2681 Unsteadiness on feet: Secondary | ICD-10-CM | POA: Diagnosis not present

## 2020-08-15 DIAGNOSIS — R2689 Other abnormalities of gait and mobility: Secondary | ICD-10-CM | POA: Diagnosis not present

## 2020-08-15 DIAGNOSIS — M6281 Muscle weakness (generalized): Secondary | ICD-10-CM | POA: Diagnosis not present

## 2020-08-15 DIAGNOSIS — Z9181 History of falling: Secondary | ICD-10-CM | POA: Diagnosis not present

## 2020-08-17 DIAGNOSIS — R2681 Unsteadiness on feet: Secondary | ICD-10-CM | POA: Diagnosis not present

## 2020-08-17 DIAGNOSIS — M6281 Muscle weakness (generalized): Secondary | ICD-10-CM | POA: Diagnosis not present

## 2020-08-17 DIAGNOSIS — R2689 Other abnormalities of gait and mobility: Secondary | ICD-10-CM | POA: Diagnosis not present

## 2020-08-17 DIAGNOSIS — Z9181 History of falling: Secondary | ICD-10-CM | POA: Diagnosis not present

## 2020-08-18 DIAGNOSIS — Z9181 History of falling: Secondary | ICD-10-CM | POA: Diagnosis not present

## 2020-08-18 DIAGNOSIS — M6281 Muscle weakness (generalized): Secondary | ICD-10-CM | POA: Diagnosis not present

## 2020-08-18 DIAGNOSIS — R2689 Other abnormalities of gait and mobility: Secondary | ICD-10-CM | POA: Diagnosis not present

## 2020-08-18 DIAGNOSIS — R2681 Unsteadiness on feet: Secondary | ICD-10-CM | POA: Diagnosis not present

## 2020-08-23 DIAGNOSIS — M6281 Muscle weakness (generalized): Secondary | ICD-10-CM | POA: Diagnosis not present

## 2020-08-23 DIAGNOSIS — Z9181 History of falling: Secondary | ICD-10-CM | POA: Diagnosis not present

## 2020-08-23 DIAGNOSIS — R2681 Unsteadiness on feet: Secondary | ICD-10-CM | POA: Diagnosis not present

## 2020-08-23 DIAGNOSIS — R2689 Other abnormalities of gait and mobility: Secondary | ICD-10-CM | POA: Diagnosis not present

## 2020-08-25 DIAGNOSIS — Z9181 History of falling: Secondary | ICD-10-CM | POA: Diagnosis not present

## 2020-08-25 DIAGNOSIS — R2681 Unsteadiness on feet: Secondary | ICD-10-CM | POA: Diagnosis not present

## 2020-08-25 DIAGNOSIS — M6281 Muscle weakness (generalized): Secondary | ICD-10-CM | POA: Diagnosis not present

## 2020-08-25 DIAGNOSIS — R2689 Other abnormalities of gait and mobility: Secondary | ICD-10-CM | POA: Diagnosis not present

## 2020-08-26 DIAGNOSIS — R2689 Other abnormalities of gait and mobility: Secondary | ICD-10-CM | POA: Diagnosis not present

## 2020-08-26 DIAGNOSIS — M6281 Muscle weakness (generalized): Secondary | ICD-10-CM | POA: Diagnosis not present

## 2020-08-26 DIAGNOSIS — R2681 Unsteadiness on feet: Secondary | ICD-10-CM | POA: Diagnosis not present

## 2020-08-26 DIAGNOSIS — Z9181 History of falling: Secondary | ICD-10-CM | POA: Diagnosis not present

## 2020-08-30 DIAGNOSIS — R2681 Unsteadiness on feet: Secondary | ICD-10-CM | POA: Diagnosis not present

## 2020-08-30 DIAGNOSIS — R2689 Other abnormalities of gait and mobility: Secondary | ICD-10-CM | POA: Diagnosis not present

## 2020-08-30 DIAGNOSIS — Z9181 History of falling: Secondary | ICD-10-CM | POA: Diagnosis not present

## 2020-08-30 DIAGNOSIS — M6281 Muscle weakness (generalized): Secondary | ICD-10-CM | POA: Diagnosis not present

## 2020-08-31 DIAGNOSIS — M6281 Muscle weakness (generalized): Secondary | ICD-10-CM | POA: Diagnosis not present

## 2020-08-31 DIAGNOSIS — R2681 Unsteadiness on feet: Secondary | ICD-10-CM | POA: Diagnosis not present

## 2020-08-31 DIAGNOSIS — Z9181 History of falling: Secondary | ICD-10-CM | POA: Diagnosis not present

## 2020-08-31 DIAGNOSIS — R2689 Other abnormalities of gait and mobility: Secondary | ICD-10-CM | POA: Diagnosis not present

## 2020-09-12 DIAGNOSIS — Z8619 Personal history of other infectious and parasitic diseases: Secondary | ICD-10-CM | POA: Diagnosis not present

## 2020-09-12 DIAGNOSIS — R42 Dizziness and giddiness: Secondary | ICD-10-CM | POA: Diagnosis not present

## 2020-09-12 DIAGNOSIS — E785 Hyperlipidemia, unspecified: Secondary | ICD-10-CM | POA: Diagnosis not present

## 2020-09-12 DIAGNOSIS — T7840XD Allergy, unspecified, subsequent encounter: Secondary | ICD-10-CM | POA: Diagnosis not present

## 2020-09-12 DIAGNOSIS — R54 Age-related physical debility: Secondary | ICD-10-CM | POA: Diagnosis not present

## 2020-09-12 DIAGNOSIS — I10 Essential (primary) hypertension: Secondary | ICD-10-CM | POA: Diagnosis not present

## 2020-09-12 DIAGNOSIS — G4700 Insomnia, unspecified: Secondary | ICD-10-CM | POA: Diagnosis not present

## 2020-09-20 DIAGNOSIS — G4700 Insomnia, unspecified: Secondary | ICD-10-CM | POA: Diagnosis not present

## 2020-10-07 DIAGNOSIS — H348312 Tributary (branch) retinal vein occlusion, right eye, stable: Secondary | ICD-10-CM | POA: Diagnosis not present

## 2020-10-07 DIAGNOSIS — H43813 Vitreous degeneration, bilateral: Secondary | ICD-10-CM | POA: Diagnosis not present

## 2020-10-07 DIAGNOSIS — H353122 Nonexudative age-related macular degeneration, left eye, intermediate dry stage: Secondary | ICD-10-CM | POA: Diagnosis not present

## 2020-10-07 DIAGNOSIS — H35371 Puckering of macula, right eye: Secondary | ICD-10-CM | POA: Diagnosis not present

## 2020-10-13 DIAGNOSIS — E785 Hyperlipidemia, unspecified: Secondary | ICD-10-CM | POA: Diagnosis not present

## 2020-10-13 DIAGNOSIS — I10 Essential (primary) hypertension: Secondary | ICD-10-CM | POA: Diagnosis not present

## 2020-10-13 DIAGNOSIS — D649 Anemia, unspecified: Secondary | ICD-10-CM | POA: Diagnosis not present

## 2020-10-13 DIAGNOSIS — Z5181 Encounter for therapeutic drug level monitoring: Secondary | ICD-10-CM | POA: Diagnosis not present

## 2020-10-20 DIAGNOSIS — R110 Nausea: Secondary | ICD-10-CM | POA: Diagnosis not present

## 2020-10-20 DIAGNOSIS — R42 Dizziness and giddiness: Secondary | ICD-10-CM | POA: Diagnosis not present

## 2020-10-20 DIAGNOSIS — K219 Gastro-esophageal reflux disease without esophagitis: Secondary | ICD-10-CM | POA: Diagnosis not present

## 2020-11-04 DIAGNOSIS — I10 Essential (primary) hypertension: Secondary | ICD-10-CM | POA: Diagnosis not present

## 2020-11-04 DIAGNOSIS — R42 Dizziness and giddiness: Secondary | ICD-10-CM | POA: Diagnosis not present

## 2020-11-04 DIAGNOSIS — R54 Age-related physical debility: Secondary | ICD-10-CM | POA: Diagnosis not present

## 2020-11-04 DIAGNOSIS — A0471 Enterocolitis due to Clostridium difficile, recurrent: Secondary | ICD-10-CM | POA: Diagnosis not present

## 2020-11-04 DIAGNOSIS — E785 Hyperlipidemia, unspecified: Secondary | ICD-10-CM | POA: Diagnosis not present

## 2020-11-07 DIAGNOSIS — C4401 Basal cell carcinoma of skin of lip: Secondary | ICD-10-CM | POA: Diagnosis not present

## 2020-11-07 DIAGNOSIS — D2239 Melanocytic nevi of other parts of face: Secondary | ICD-10-CM | POA: Diagnosis not present

## 2020-11-07 DIAGNOSIS — D485 Neoplasm of uncertain behavior of skin: Secondary | ICD-10-CM | POA: Diagnosis not present
